# Patient Record
Sex: Male | Born: 1980 | Race: White | Hispanic: No
Health system: Midwestern US, Community
[De-identification: ages and names within clinical notes are randomized; demographics above are authoritative.]

## PROBLEM LIST (undated history)

## (undated) DIAGNOSIS — E109 Type 1 diabetes mellitus without complications: Secondary | ICD-10-CM

## (undated) DIAGNOSIS — I213 ST elevation (STEMI) myocardial infarction of unspecified site: Secondary | ICD-10-CM

## (undated) DIAGNOSIS — Z8614 Personal history of Methicillin resistant Staphylococcus aureus infection: Secondary | ICD-10-CM

## (undated) DIAGNOSIS — I200 Unstable angina: Secondary | ICD-10-CM

## (undated) DIAGNOSIS — I639 Cerebral infarction, unspecified: Secondary | ICD-10-CM

## (undated) DIAGNOSIS — N179 Acute kidney failure, unspecified: Secondary | ICD-10-CM

## (undated) DIAGNOSIS — Z9889 Other specified postprocedural states: Secondary | ICD-10-CM

## (undated) DIAGNOSIS — I2510 Atherosclerotic heart disease of native coronary artery without angina pectoris: Secondary | ICD-10-CM

## (undated) DIAGNOSIS — I10 Essential (primary) hypertension: Secondary | ICD-10-CM

## (undated) DIAGNOSIS — E119 Type 2 diabetes mellitus without complications: Secondary | ICD-10-CM

## (undated) DIAGNOSIS — K859 Acute pancreatitis without necrosis or infection, unspecified: Secondary | ICD-10-CM

## (undated) DIAGNOSIS — R1012 Left upper quadrant pain: Secondary | ICD-10-CM

## (undated) HISTORY — PX: ABDOMINAL SURGERY: SHX537

## (undated) HISTORY — PX: CHOLECYSTECTOMY: SHX55

## (undated) HISTORY — PX: APPENDECTOMY (OPEN): SHX54

## (undated) HISTORY — PX: HX APPENDECTOMY: SHX54

## (undated) HISTORY — PX: HX GALL BLADDER SURGERY/CHOLE: SHX55

## (undated) HISTORY — PX: CORONARY ARTERY ANGIOPLASTY: PR CATH30428

## (undated) HISTORY — PX: WRIST SURGERY: SHX841

## (undated) HISTORY — PX: APPENDECTOMY: SHX54

---

## 2015-12-07 ENCOUNTER — Emergency Department
Admission: EM | Admit: 2015-12-07 | Discharge: 2015-12-07 | Disposition: A | Discharge status: Home / Self Care | Payer: Medicare Other | Attending: Emergency Medicine | Admitting: Emergency Medicine

## 2015-12-07 ENCOUNTER — Emergency Department: Payer: Medicare Other

## 2015-12-07 ENCOUNTER — Encounter: Payer: Self-pay | Admitting: Emergency Medicine

## 2015-12-07 DIAGNOSIS — K859 Acute pancreatitis without necrosis or infection, unspecified: Secondary | ICD-10-CM | POA: Insufficient documentation

## 2015-12-07 DIAGNOSIS — E1165 Type 2 diabetes mellitus with hyperglycemia: Secondary | ICD-10-CM | POA: Diagnosis not present

## 2015-12-07 DIAGNOSIS — Z794 Long term (current) use of insulin: Secondary | ICD-10-CM | POA: Diagnosis not present

## 2015-12-07 DIAGNOSIS — R1011 Right upper quadrant pain: Secondary | ICD-10-CM | POA: Insufficient documentation

## 2015-12-07 DIAGNOSIS — Z9089 Acquired absence of other organs: Secondary | ICD-10-CM | POA: Diagnosis not present

## 2015-12-07 DIAGNOSIS — F17220 Nicotine dependence, chewing tobacco, uncomplicated: Secondary | ICD-10-CM | POA: Insufficient documentation

## 2015-12-07 DIAGNOSIS — R739 Hyperglycemia, unspecified: Secondary | ICD-10-CM

## 2015-12-07 HISTORY — DX: Type 2 diabetes mellitus without complications: E11.9

## 2015-12-07 HISTORY — DX: Acute pancreatitis without necrosis or infection, unspecified: K85.90

## 2015-12-07 LAB — CBC
HEMATOCRIT: 49.1 % (ref 40.0–52.0)
HEMOGLOBIN: 16.4 g/dL (ref 13.0–18.0)
MCH: 28 pg (ref 26.0–34.0)
MCHC: 33.4 g/dL (ref 32.0–36.0)
MCV: 83.8 fL (ref 80.0–100.0)
Platelets: 251 10*3/uL (ref 150–440)
RBC: 5.86 MIL/uL (ref 4.40–5.90)
RDW: 13.7 % (ref 11.5–14.5)
WBC: 8.3 10*3/uL (ref 3.8–10.6)

## 2015-12-07 LAB — GLUCOSE, CAPILLARY
GLUCOSE-CAPILLARY: 485 mg/dL — AB (ref 65–99)
GLUCOSE-CAPILLARY: 410 mg/dL — AB (ref 65–99)
Glucose-Capillary: 359 mg/dL — ABNORMAL HIGH (ref 65–99)
Glucose-Capillary: 295 mg/dL — ABNORMAL HIGH (ref 65–99)

## 2015-12-07 LAB — COMPREHENSIVE METABOLIC PANEL
ALT: 40 U/L (ref 17–63)
AST: 35 U/L (ref 15–41)
Albumin: 4 g/dL (ref 3.5–5.0)
Alkaline Phosphatase: 93 U/L (ref 38–126)
Anion gap: 9 (ref 5–15)
BILIRUBIN TOTAL: 0.4 mg/dL (ref 0.3–1.2)
BUN: 14 mg/dL (ref 6–20)
CHLORIDE: 100 mmol/L — AB (ref 101–111)
CO2: 22 mmol/L (ref 22–32)
CREATININE: 1.07 mg/dL (ref 0.61–1.24)
Calcium: 9.2 mg/dL (ref 8.9–10.3)
Glucose, Bld: 626 mg/dL (ref 65–99)
POTASSIUM: 4.2 mmol/L (ref 3.5–5.1)
Sodium: 131 mmol/L — ABNORMAL LOW (ref 135–145)
TOTAL PROTEIN: 7.5 g/dL (ref 6.5–8.1)

## 2015-12-07 LAB — URINALYSIS COMPLETE WITH MICROSCOPIC (ARMC ONLY)
BACTERIA UA: NONE SEEN
Bilirubin Urine: NEGATIVE
Glucose, UA: >500 mg/dL — AB
HGB URINE DIPSTICK: NEGATIVE
KETONES UR: NEGATIVE mg/dL
LEUKOCYTES UA: NEGATIVE
NITRITE: NEGATIVE
PROTEIN: NEGATIVE mg/dL
RBC / HPF: NONE SEEN RBC/hpf (ref 0–5)
SPECIFIC GRAVITY, URINE: 1.03 (ref 1.005–1.030)
Squamous Epithelial / LPF: NONE SEEN
pH: 5 (ref 5.0–8.0)

## 2015-12-07 LAB — LIPASE, BLOOD: LIPASE: 30 U/L (ref 11–51)

## 2015-12-07 MED ORDER — ONDANSETRON HCL 4 MG/2ML IJ SOLN
4.0000 mg | Freq: Once | INTRAMUSCULAR | Status: AC
  Administered 2015-12-07: 4 mg via INTRAVENOUS
  Filled 2015-12-07: qty 2

## 2015-12-07 MED ORDER — IOHEXOL 300 MG/ML  SOLN
100.0000 mL | Freq: Once | INTRAMUSCULAR | Status: AC | PRN
  Administered 2015-12-07: 100 mL via INTRAVENOUS

## 2015-12-07 MED ORDER — INSULIN ASPART 100 UNIT/ML ~~LOC~~ SOLN
5.0000 [IU] | Freq: Once | SUBCUTANEOUS | Status: AC
  Administered 2015-12-07: 5 [IU] via INTRAVENOUS
  Filled 2015-12-07: qty 5

## 2015-12-07 MED ORDER — MORPHINE SULFATE (PF) 4 MG/ML IV SOLN
4.0000 mg | Freq: Once | INTRAVENOUS | Status: AC
  Administered 2015-12-07: 4 mg via INTRAVENOUS
  Filled 2015-12-07: qty 1

## 2015-12-07 MED ORDER — IOHEXOL 240 MG/ML SOLN
25.0000 mL | Freq: Once | INTRAMUSCULAR | Status: AC | PRN
  Administered 2015-12-07: 25 mL via ORAL

## 2015-12-07 MED ORDER — SODIUM CHLORIDE 0.9 % IV BOLUS (SEPSIS)
1000.0000 mL | Freq: Once | INTRAVENOUS | Status: AC
  Administered 2015-12-07: 1000 mL via INTRAVENOUS

## 2015-12-07 MED ORDER — OXYCODONE-ACETAMINOPHEN 5-325 MG PO TABS: 1.0000 | ORAL_TABLET | Freq: Four times a day (QID) | ORAL | Status: AC | PRN

## 2015-12-07 MED ORDER — HYDROMORPHONE HCL 1 MG/ML IJ SOLN
1.0000 mg | Freq: Once | INTRAMUSCULAR | Status: AC
  Administered 2015-12-07: 1 mg via INTRAVENOUS
  Filled 2015-12-07: qty 1

## 2015-12-07 NOTE — ED Notes (Signed)
Pt off floor to CT

## 2015-12-07 NOTE — Discharge Instructions (Signed)
Abdominal Pain, Adult °Many things can cause abdominal pain. Usually, abdominal pain is not caused by a disease and will improve without treatment. It can often be observed and treated at home. Your health care provider will do a physical exam and possibly order blood tests and X-rays to help determine the seriousness of your pain. However, in many cases, more time must pass before a clear cause of the pain can be found. Before that point, your health care provider may not know if you need more testing or further treatment. °HOME CARE INSTRUCTIONS °Monitor your abdominal pain for any changes. The following actions may help to alleviate any discomfort you are experiencing: °· Only take over-the-counter or prescription medicines as directed by your health care provider. °· Do not take laxatives unless directed to do so by your health care provider. °· Try a clear liquid diet (broth, tea, or water) as directed by your health care provider. Slowly move to a bland diet as tolerated. °SEEK MEDICAL CARE IF: °· You have unexplained abdominal pain. °· You have abdominal pain associated with nausea or diarrhea. °· You have pain when you urinate or have a bowel movement. °· You experience abdominal pain that wakes you in the night. °· You have abdominal pain that is worsened or improved by eating food. °· You have abdominal pain that is worsened with eating fatty foods. °· You have a fever. °SEEK IMMEDIATE MEDICAL CARE IF: °· Your pain does not go away within 2 hours. °· You keep throwing up (vomiting). °· Your pain is felt only in portions of the abdomen, such as the right side or the left lower portion of the abdomen. °· You pass bloody or black tarry stools. °MAKE SURE YOU: °· Understand these instructions. °· Will watch your condition. °· Will get help right away if you are not doing well or get worse. °  °This information is not intended to replace advice given to you by your health care provider. Make sure you discuss  any questions you have with your health care provider. °  °Document Released: 06/17/2005 Document Revised: 05/29/2015 Document Reviewed: 05/17/2013 °Elsevier Interactive Patient Education ©2016 [employer] ° °Hyperglycemia °Hyperglycemia occurs when the glucose (sugar) in your blood is too high. Hyperglycemia can happen for many reasons, but it most often happens to people who do not know they have diabetes or are not managing their diabetes properly.  °CAUSES  °Whether you have diabetes or not, there are other causes of hyperglycemia. Hyperglycemia can occur when you have diabetes, but it can also occur in other situations that you might not be as aware of, such as: °Diabetes °· If you have diabetes and are having problems controlling your blood glucose, hyperglycemia could occur because of some of the following reasons: °¨ Not following your meal plan. °¨ Not taking your diabetes medications or not taking it properly. °¨ Exercising less or doing less activity than you normally do. °¨ Being sick. °Pre-diabetes °· This cannot be ignored. Before people develop Type 2 diabetes, they almost always have "pre-diabetes." This is when your blood glucose levels are higher than normal, but not yet high enough to be diagnosed as diabetes. Research has shown that some long-term damage to the body, especially the heart and circulatory system, may already be occurring during pre-diabetes. If you take action to manage your blood glucose when you have pre-diabetes, you may delay or prevent Type 2 diabetes from developing. °Stress °· If you have diabetes, you may be "diet"   controlled or on oral medications or insulin to control your diabetes. However, you may find that your blood glucose is higher than usual in the hospital whether you have diabetes or not. This is often referred to as "stress hyperglycemia." Stress can elevate your blood glucose. This happens because of hormones put out by the body during times of stress. If  stress has been the cause of your high blood glucose, it can be followed regularly by your caregiver. That way he/she can make sure your hyperglycemia does not continue to get worse or progress to diabetes. °Steroids °· Steroids are medications that act on the infection fighting system (immune system) to block inflammation or infection. One side effect can be a rise in blood glucose. Most people can produce enough extra insulin to allow for this rise, but for those who cannot, steroids make blood glucose levels go even higher. It is not unusual for steroid treatments to "uncover" diabetes that is developing. It is not always possible to determine if the hyperglycemia will go away after the steroids are stopped. A special blood test called an A1c is sometimes done to determine if your blood glucose was elevated before the steroids were started. °SYMPTOMS °· Thirsty. °· Frequent urination. °· Dry mouth. °· Blurred vision. °· Tired or fatigue. °· Weakness. °· Sleepy. °· Tingling in feet or leg. °DIAGNOSIS  °Diagnosis is made by monitoring blood glucose in one or all of the following ways: °· A1c test. This is a chemical found in your blood. °· Fingerstick blood glucose monitoring. °· Laboratory results. °TREATMENT  °First, knowing the cause of the hyperglycemia is important before the hyperglycemia can be treated. Treatment may include, but is not be limited to: °· Education. °· Change or adjustment in medications. °· Change or adjustment in meal plan. °· Treatment for an illness, infection, etc. °· More frequent blood glucose monitoring. °· Change in exercise plan. °· Decreasing or stopping steroids. °· Lifestyle changes. °HOME CARE INSTRUCTIONS  °· Test your blood glucose as directed. °· Exercise regularly. Your caregiver will give you instructions about exercise. Pre-diabetes or diabetes which comes on with stress is helped by exercising. °· Eat wholesome, balanced meals. Eat often and at regular, fixed times. Your  caregiver or nutritionist will give you a meal plan to guide your sugar intake. °· Being at an ideal weight is important. If needed, losing as little as 10 to 15 pounds may help improve blood glucose levels. °SEEK MEDICAL CARE IF:  °· You have questions about medicine, activity, or diet. °· You continue to have symptoms (problems such as increased thirst, urination, or weight gain). °SEEK IMMEDIATE MEDICAL CARE IF:  °· You are vomiting or have diarrhea. °· Your breath smells fruity. °· You are breathing faster or slower. °· You are very sleepy or incoherent. °· You have numbness, tingling, or pain in your feet or hands. °· You have chest pain. °· Your symptoms get worse even though you have been following your caregiver's orders. °· If you have any other questions or concerns. °  °This information is not intended to replace advice given to you by your health care provider. Make sure you discuss any questions you have with your health care provider. °  °Document Released: 03/03/2001 Document Revised: 11/30/2011 Document Reviewed: 05/14/2015 °Elsevier Interactive Patient Education ©2016 [employer] ° °

## 2015-12-07 NOTE — ED Notes (Signed)
Critical glucose level. MD notified.

## 2015-12-07 NOTE — ED Notes (Signed)
Patient presents to the ED with hyperglycemia and right upper quadrant abdominal pain.  Patient reports his blood sugar has been in the 500s the past two days and patient states the abdominal pain started around the same time.  Patient reports history of diabetes and pancreatitis.  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  Patient has recently moved and has not yet set up primary care in this area.

## 2015-12-07 NOTE — ED Provider Notes (Signed)
[facility] Emergency Department Provider Note  ____________________________________________  Time seen: Approximately 5:10 PM  I have reviewed the triage vital signs and the nursing notes.   HISTORY  Chief Complaint Hyperglycemia    HPI Cameron Barnett is a 35 y.o. male with a history of diabetes and pancreatitis who is presenting to the emergency department today with hyperglycemia as well as right upper quadrant or right flank pain. He says that the pain started 2 days ago and has progressed to a 7 out of 10. He says it feels like someone is stabbing him. He denies any vomiting but says he has been nauseous. Denies any diarrhea. Says he has had his gallbladder as well as his appendix taken out. Says that he is a history of pancreatitis. Denies any drinking history. Says that the last time he had pancreatitis the pain started to the right side of his abdomen and then progressed to the point where is across his whole upper abdomen. Patient says that he has been compliant with his insulin and is not eating any excess of sugar or carbohydrates.   Past Medical History  Diagnosis Date  . Diabetes mellitus without complication (HCC)   . Pancreatitis     There are no active problems to display for this patient.   Past Surgical History  Procedure Laterality Date  . Cholecystectomy    . Appendectomy    . Wrist surgery      No current outpatient prescriptions on file.  Allergies Penicillins  History reviewed. No pertinent family history.  Social History Social History  Substance Use Topics  . Smoking status: Never Smoker   . Smokeless tobacco: Current User    Types: Chew  . Alcohol Use: No    Review of Systems Constitutional: No fever/chills Eyes: No visual changes. ENT: No sore throat. Cardiovascular: Denies chest pain. Respiratory: Denies shortness of breath. Gastrointestinal:no vomiting.  No diarrhea.  No constipation. Genitourinary: Negative  for dysuria. Musculoskeletal: Negative for back pain. Skin: Negative for rash. Neurological: Negative for headaches, focal weakness or numbness.  10-point ROS otherwise negative.  ____________________________________________   PHYSICAL EXAM:  VITAL SIGNS: ED Triage Vitals  Enc Vitals Group     BP 12/07/15 1657 146/95 mmHg     Pulse Rate 12/07/15 1657 116     Resp 12/07/15 1657 20     Temp 12/07/15 1657 98 F (36.7 C)     Temp Source 12/07/15 1657 Oral     SpO2 12/07/15 1657 100 %     Weight 12/07/15 1657 223 lb (101.152 kg)     Height 12/07/15 1657 6\' 1"  (1.854 m)     Head Cir --      Peak Flow --      Pain Score 12/07/15 1658 7     Pain Loc --      Pain Edu? --      Excl. in GC? --     Constitutional: Alert and oriented. Well appearing and in no acute distress. Eyes: Conjunctivae are normal. PERRL. EOMI. Head: Atraumatic. Nose: No congestion/rhinnorhea. Mouth/Throat: Mucous membranes are moist.   Neck: No stridor.   Cardiovascular: Normal rate, regular rhythm. Grossly normal heart sounds.  Good peripheral circulation. Respiratory: Normal respiratory effort.  No retractions. Lungs CTAB. Gastrointestinal: Soft With mild right upper quadrant as well as right flank tenderness. Negative Murphy sign. No distention. No CVA tenderness. Musculoskeletal: No lower extremity tenderness nor edema.  No joint effusions. Neurologic:  Normal speech and language. No  gross focal neurologic deficits are appreciated. No gait instability. Skin:  Skin is warm, dry and intact. No rash noted. Psychiatric: Mood and affect are normal. Speech and behavior are normal.  ____________________________________________   LABS (all labs ordered are listed, but only abnormal results are displayed)  Labs Reviewed  URINALYSIS COMPLETEWITH MICROSCOPIC ([facility] ONLY) - Abnormal; Notable for the following:    Color, Urine STRAW (*)    APPearance CLEAR (*)    Glucose, UA >500 (*)    All other components  within normal limits  COMPREHENSIVE METABOLIC PANEL - Abnormal; Notable for the following:    Sodium 131 (*)    Chloride 100 (*)    Glucose, Bld 626 (*)    All other components within normal limits  GLUCOSE, CAPILLARY - Abnormal; Notable for the following:    Glucose-Capillary 485 (*)    All other components within normal limits  GLUCOSE, CAPILLARY - Abnormal; Notable for the following:    Glucose-Capillary 410 (*)    All other components within normal limits  GLUCOSE, CAPILLARY - Abnormal; Notable for the following:    Glucose-Capillary 359 (*)    All other components within normal limits  CBC  LIPASE, BLOOD  CBG MONITORING, ED   ____________________________________________  EKG  ED ECG REPORT I, Olesya Wike,  Teena Irani, the attending physician, personally viewed and interpreted this ECG.   Date: 12/07/2015  EKG Time: 1700  Rate: 111  Rhythm: sinus tachycardia  Axis: Normal  Intervals:none  ST&T Change: No ST segment elevation or depression. No abnormal T-wave inversion.  ____________________________________________  RADIOLOGY  IMPRESSION: 1. No acute abnormality. 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 3. Mild diffuse hepatic steatosis.   Electronically Signed By: Beckie Salts M.D. On: 12/07/2015 19:40 ____________________________________________   PROCEDURES   ____________________________________________   INITIAL IMPRESSION / ASSESSMENT AND PLAN / ED COURSE  Pertinent labs & imaging results that were available during my care of the patient were reviewed by me and considered in my medical decision making (see chart for details).  ----------------------------------------- 6:17 PM on 12/07/2015 -----------------------------------------  Patient with only mild relief pain with morphine. We'll repeat dose pain medication. No x-rays for pain on his blood work. We'll proceed to CAT scan. Patient denies any shortness of breath. Denies any  radiation of the chest. Says that the pain does not get worse with deep breathing.  ----------------------------------------- 9:40 PM on 12/07/2015 -----------------------------------------  Patient still having waxing and waning pain to the right upper quadrant. He still denies any pain with deep inspiration or pain radiating up into his chest. His CT was reassuring. His heart rate while I reassessed him was 85 bpm. He said he would prefer to go home at this time. I will refer him to the [facility] as he does not have any follow-up for outpatient diabetes treatment at this time. I'll be discharging the patient with Percocet. He says he has insulin at home. He knows to return if the pain worsens. Understands the plan and willing to comply. I did offer him admission but he says he would prefer to go home at this time since the workup has been essentially reassuring. ____________________________________________   FINAL CLINICAL IMPRESSION(S) / ED DIAGNOSES  Right upper quadrant abdominal pain. Hyperglycemia.    Myrna Blazer, MD 12/07/15 2142

## 2016-09-10 IMAGING — CT CT ABD-PELV W/ CM
1 of 2 series · 15 of 32 positions shown, 19 images · IV contrast (omnipaque)
Comparison: None.

CLINICAL DATA: Abdominal pain for the past 2 days. Previous
appendectomy and cholecystectomy. History of pancreatitis.

EXAM:
CT ABDOMEN AND PELVIS WITH CONTRAST
TECHNIQUE: Multidetector CT imaging of the abdomen and pelvis was performed
using the standard protocol following bolus administration of
intravenous contrast.
CONTRAST:  100mL OMNIPAQUE IOHEXOL 300 MG/ML  SOLN

[Series 2: routine abd pel with · axial · 0.82mm/px · z∈[-612,-98]mm · 15 of 113 slices shown, 19 images]
[im 5/113  soft-tissue]
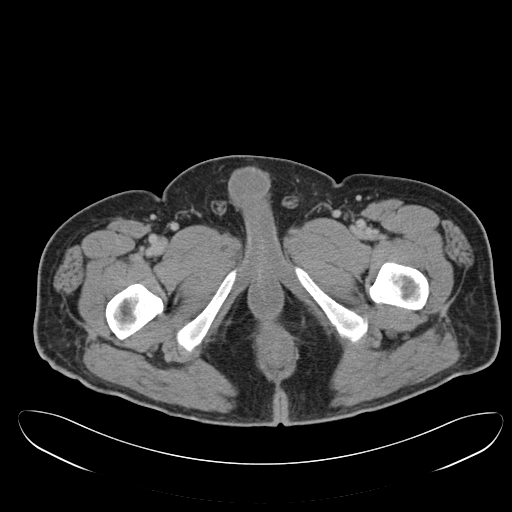
[im 5/113  bone]
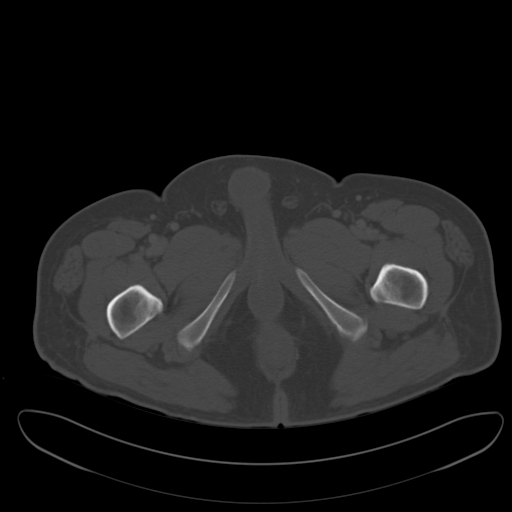
[im 13/113  soft-tissue]
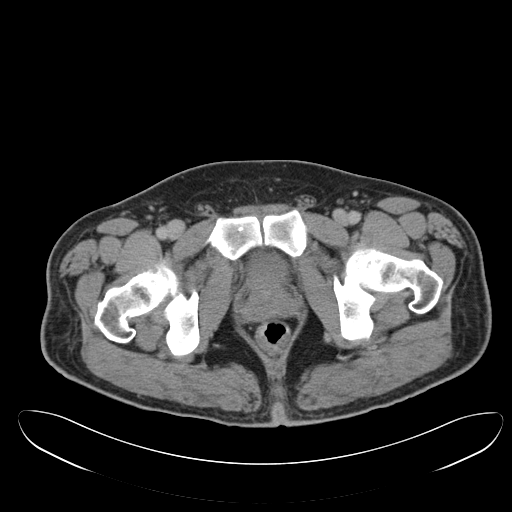
[im 22/113  soft-tissue]
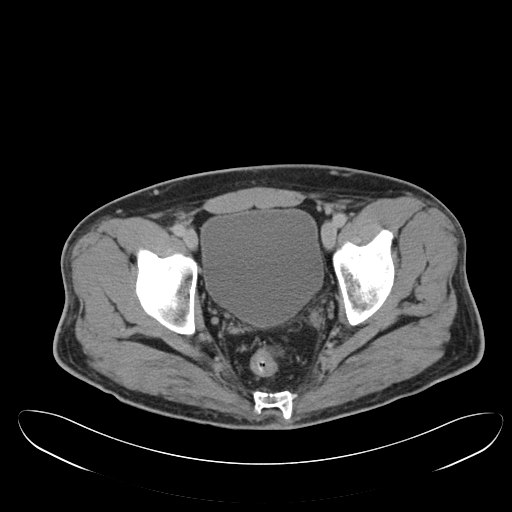
[im 31/113  soft-tissue]
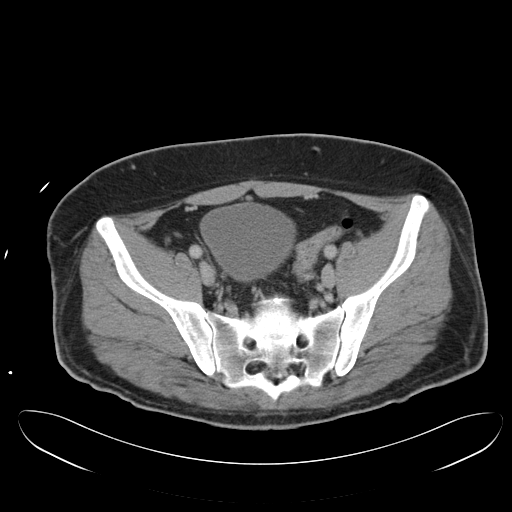
[im 39/113  soft-tissue]
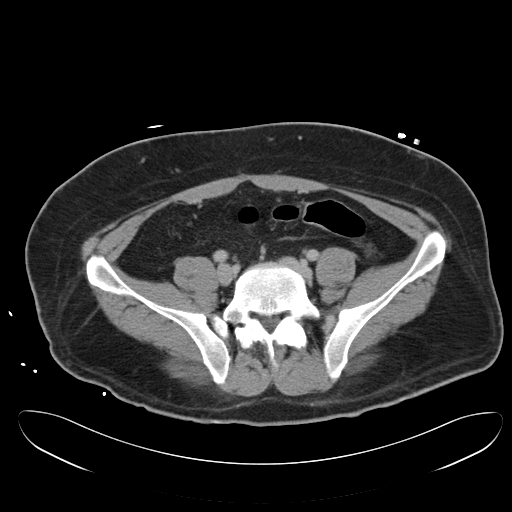
[im 48/113  soft-tissue]
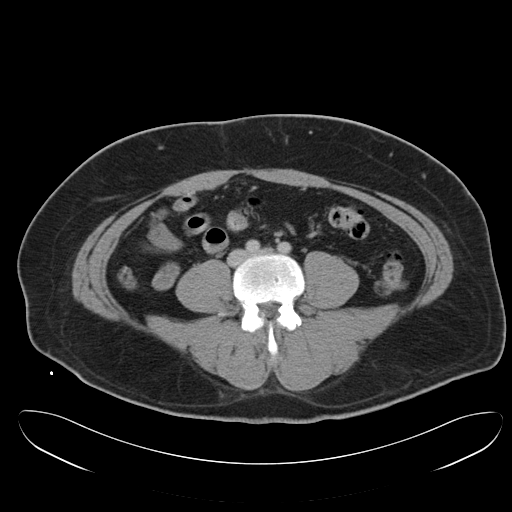
[im 57/113  soft-tissue]
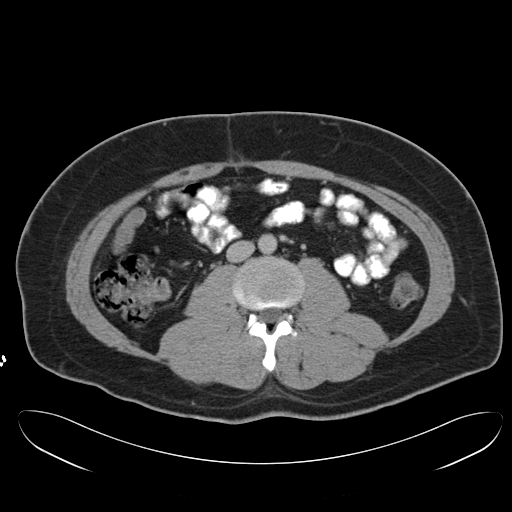
[im 65/113  soft-tissue]
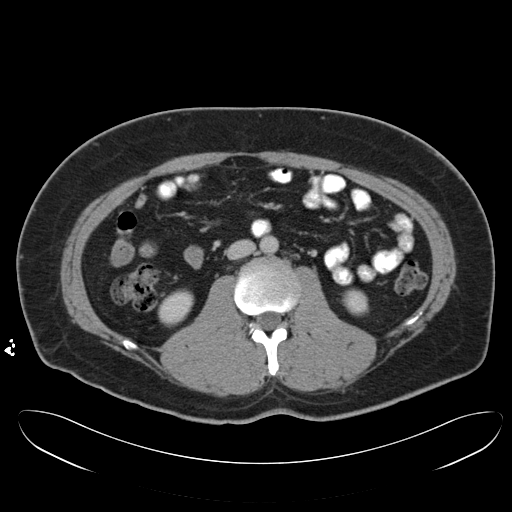
[im 74/113  soft-tissue]
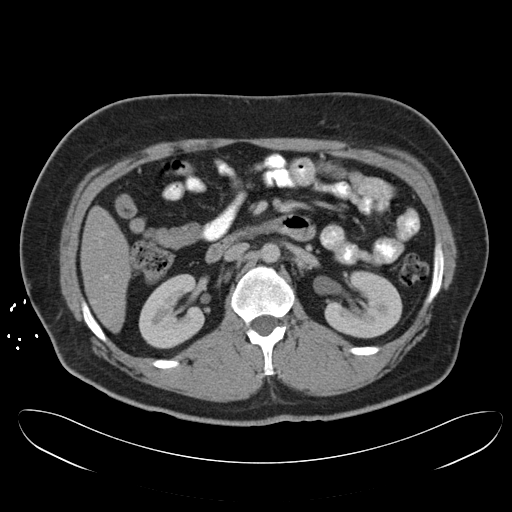
[im 74/113  bone]
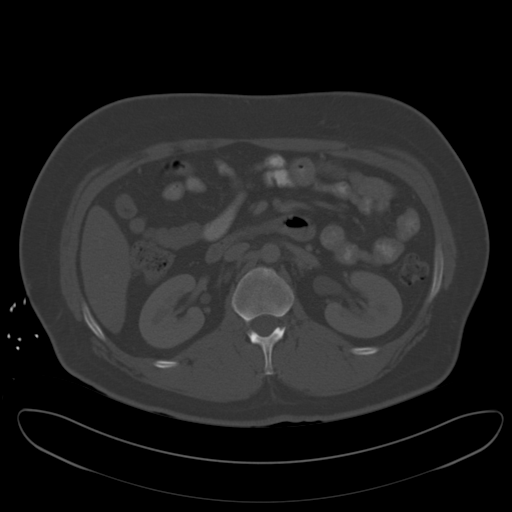
[im 82/113  soft-tissue]
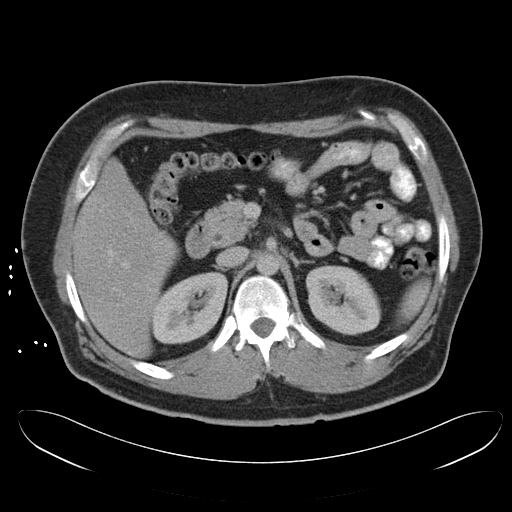
[im 91/113  soft-tissue]
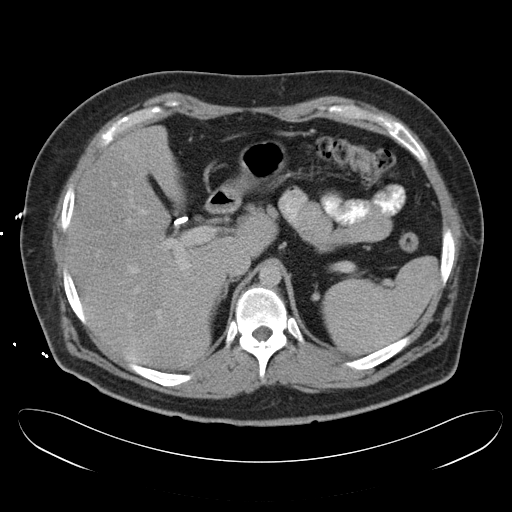
[im 95/113  lung]
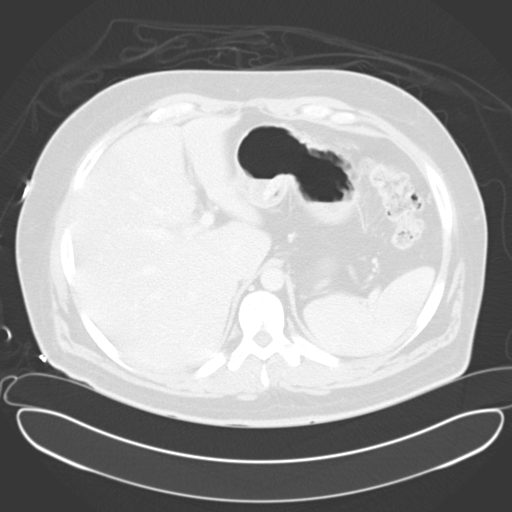
[im 100/113  soft-tissue]
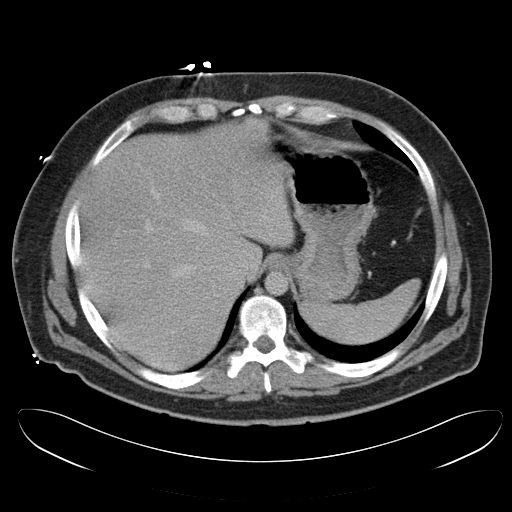
[im 100/113  lung]
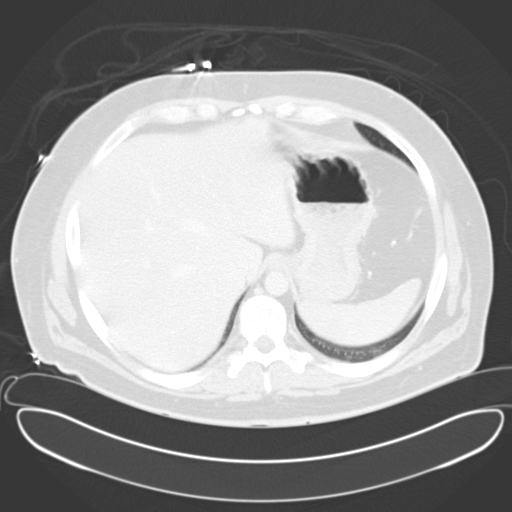
[im 104/113  lung]
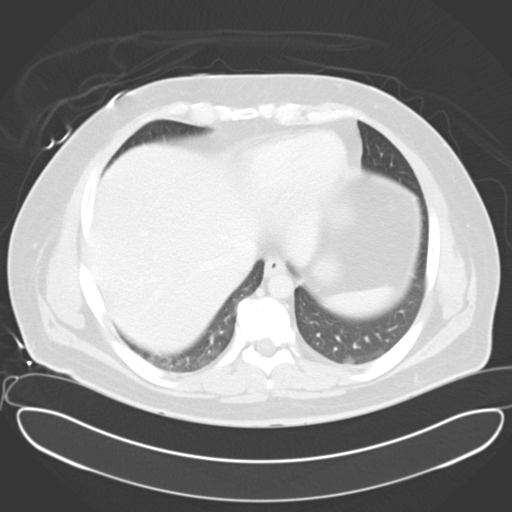
[im 108/113  soft-tissue]
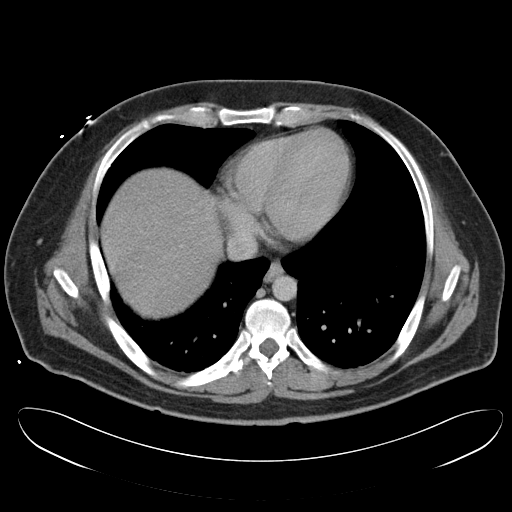
[im 108/113  lung]
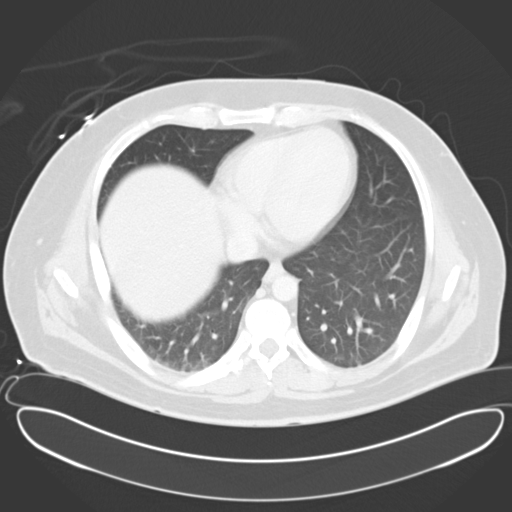

[15 of 32 positions shown; findings below may reference images not displayed]

FINDINGS: Lower chest:  Minimal bilateral dependent atelectasis.

Hepatobiliary: Mild diffuse low density of the liver relative to the
spleen. Cholecystectomy clips.

Pancreas: The tail and distal body of the pancreas are not present.
The head, neck and proximal body have normal appearances.

Spleen: Within normal limits in size and appearance.

Adrenals/Urinary Tract: No masses identified. No evidence of
hydronephrosis.

Stomach/Bowel: No gastrointestinal abnormalities. Surgically absent
appendix.

Vascular/Lymphatic: No pathologically enlarged lymph nodes. No
evidence of abdominal aortic aneurysm.

Reproductive: No mass or other significant abnormality.

Other: None.

Musculoskeletal: Minimal lumbar and lower thoracic spine
degenerative changes.
IMPRESSION: 1. No acute abnormality.
2. Surgical or congenital absence of the distal pancreas.
3. Mild diffuse hepatic steatosis.

## 2016-10-14 ENCOUNTER — Inpatient Hospital Stay
Admit: 2016-10-14 | Discharge: 2016-10-15 | Disposition: A | Discharge status: Home / Self Care | Payer: MEDICARE | Attending: Emergency Medicine

## 2016-10-14 ENCOUNTER — Emergency Department: Admit: 2016-10-14 | Payer: MEDICARE

## 2016-10-14 ENCOUNTER — Emergency Department

## 2016-10-14 DIAGNOSIS — R197 Diarrhea, unspecified: Secondary | ICD-10-CM

## 2016-10-14 LAB — URINALYSIS W/ REFLEX CULTURE
Bacteria: NEGATIVE /hpf
Bilirubin: NEGATIVE
Blood: NEGATIVE
Glucose: >1000 mg/dL — AB
Ketone: NEGATIVE mg/dL
Leukocyte Esterase: NEGATIVE
Nitrites: NEGATIVE
Protein: NEGATIVE mg/dL
Specific gravity: 1.005 (ref 1.003–1.030)
Urobilinogen: 0.2 EU/dL (ref 0.2–1.0)
pH (UA): 5 (ref 5.0–8.0)

## 2016-10-14 LAB — CBC WITH AUTOMATED DIFF
ABS. BASOPHILS: 0 10*3/uL (ref 0.0–0.1)
ABS. EOSINOPHILS: 0.2 10*3/uL (ref 0.0–0.4)
ABS. LYMPHOCYTES: 2 10*3/uL (ref 0.8–3.5)
ABS. MONOCYTES: 0.5 10*3/uL (ref 0.0–1.0)
ABS. NEUTROPHILS: 5.4 10*3/uL (ref 1.8–8.0)
BASOPHILS: 0 % (ref 0–1)
EOSINOPHILS: 3 % (ref 0–7)
HCT: 46.3 % (ref 36.6–50.3)
HGB: 16.4 g/dL (ref 12.1–17.0)
LYMPHOCYTES: 24 % (ref 12–49)
MCH: 27.8 PG (ref 26.0–34.0)
MCHC: 35.4 g/dL (ref 30.0–36.5)
MCV: 78.6 FL — ABNORMAL LOW (ref 80.0–99.0)
MONOCYTES: 6 % (ref 5–13)
NEUTROPHILS: 67 % (ref 32–75)
PLATELET: 252 10*3/uL (ref 150–400)
RBC: 5.89 M/uL — ABNORMAL HIGH (ref 4.10–5.70)
RDW: 12.6 % (ref 11.5–14.5)
WBC: 8.1 10*3/uL (ref 4.1–11.1)

## 2016-10-14 LAB — METABOLIC PANEL, COMPREHENSIVE
A-G Ratio: 0.9 — ABNORMAL LOW (ref 1.1–2.2)
ALT (SGPT): 35 U/L (ref 12–78)
AST (SGOT): 32 U/L (ref 15–37)
Albumin: 3.6 g/dL (ref 3.5–5.0)
Alk. phosphatase: 115 U/L (ref 45–117)
Anion gap: 10 mmol/L (ref 5–15)
BUN/Creatinine ratio: 9 — ABNORMAL LOW (ref 12–20)
BUN: 12 MG/DL (ref 6–20)
Bilirubin, total: 0.5 MG/DL (ref 0.2–1.0)
CO2: 22 mmol/L (ref 21–32)
Calcium: 8.3 MG/DL — ABNORMAL LOW (ref 8.5–10.1)
Chloride: 98 mmol/L (ref 97–108)
Creatinine: 1.32 MG/DL — ABNORMAL HIGH (ref 0.70–1.30)
GFR est AA: >60 mL/min/{1.73_m2} (ref >60)
GFR est non-AA: >60 mL/min/{1.73_m2} (ref >60)
Globulin: 4.1 g/dL — ABNORMAL HIGH (ref 2.0–4.0)
Glucose: 510 mg/dL — ABNORMAL HIGH (ref 65–100)
Potassium: 4.2 mmol/L (ref 3.5–5.1)
Protein, total: 7.7 g/dL (ref 6.4–8.2)
Sodium: 130 mmol/L — ABNORMAL LOW (ref 136–145)

## 2016-10-14 LAB — GLUCOSE, POC: Glucose (POC): 461 mg/dL — ABNORMAL HIGH (ref 65–100)

## 2016-10-14 LAB — LIPASE: Lipase: 168 U/L (ref 73–393)

## 2016-10-14 MED ORDER — ONDANSETRON (PF) 4 MG/2 ML INJECTION
4 mg/2 mL | INTRAMUSCULAR | Status: AC
Start: 2016-10-14 — End: 2016-10-14
  Administered 2016-10-14: 23:00:00 via INTRAVENOUS

## 2016-10-14 MED ORDER — MORPHINE 4 MG/ML SYRINGE
4 mg/mL | INTRAMUSCULAR | Status: AC
Start: 2016-10-14 — End: 2016-10-14
  Administered 2016-10-14: 23:00:00 via INTRAVENOUS

## 2016-10-14 MED ORDER — IOPAMIDOL 76 % IV SOLN
370 mg iodine /mL (76 %) | Freq: Once | INTRAVENOUS | Status: AC
Start: 2016-10-14 — End: 2016-10-14
  Administered 2016-10-14: via INTRAVENOUS

## 2016-10-14 MED ORDER — SODIUM CHLORIDE 0.9 % IJ SYRG
Freq: Once | INTRAMUSCULAR | Status: AC
Start: 2016-10-14 — End: 2016-10-14
  Administered 2016-10-14: via INTRAVENOUS

## 2016-10-14 MED ORDER — INSULIN REGULAR HUMAN 100 UNIT/ML INJECTION
100 unit/mL | INTRAMUSCULAR | Status: AC
Start: 2016-10-14 — End: 2016-10-14
  Administered 2016-10-14: 23:00:00 via INTRAVENOUS

## 2016-10-14 MED ORDER — SODIUM CHLORIDE 0.9% BOLUS IV
0.9 % | Freq: Once | INTRAVENOUS | Status: AC
Start: 2016-10-14 — End: 2016-10-14
  Administered 2016-10-14: 22:00:00 via INTRAVENOUS

## 2016-10-14 MED ORDER — SODIUM CHLORIDE 0.9 % IV
Freq: Once | INTRAVENOUS | Status: AC
Start: 2016-10-14 — End: 2016-10-14
  Administered 2016-10-14: via INTRAVENOUS

## 2016-10-14 MED FILL — SODIUM CHLORIDE 0.9 % IV: INTRAVENOUS | Qty: 1000

## 2016-10-14 MED FILL — ISOVUE-370  76 % INTRAVENOUS SOLUTION: 370 mg iodine /mL (76 %) | INTRAVENOUS | Qty: 100

## 2016-10-14 MED FILL — BD POSIFLUSH NORMAL SALINE 0.9 % INJECTION SYRINGE: INTRAMUSCULAR | Qty: 10

## 2016-10-14 MED FILL — ONDANSETRON (PF) 4 MG/2 ML INJECTION: 4 mg/2 mL | INTRAMUSCULAR | Qty: 2

## 2016-10-14 MED FILL — INSULIN REGULAR HUMAN 100 UNIT/ML INJECTION: 100 unit/mL | INTRAMUSCULAR | Qty: 1

## 2016-10-14 MED FILL — MORPHINE 4 MG/ML SYRINGE: 4 mg/mL | INTRAMUSCULAR | Qty: 1

## 2016-10-14 NOTE — ED Provider Notes (Signed)
EMERGENCY DEPARTMENT HISTORY AND PHYSICAL EXAM      Date: 10/14/2016  Patient Name: Evan Crawford    History of Presenting Illness     No chief complaint on file.      History Provided By: Patient and EMS    HPI: Karen Kays, 36 y.o. male with PMHx significant for DM, cholecystectomy and appendectomy, presents via EMS to the ED with cc of constant stabbing LUQ pain with associated nausea and five episodes of watery diarrhea with bright red blood onset 0300. 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 EMS reports hyperglycemia 534 upon arrival; pt states DM is controlled with insulin. Pt denies taking any medications for his sx's pta. Pt denies any chronic NSAID use or recent abx use. He notes he was admitted to a hospital ~3 weeks ago for 3 days for acute pancreatitis. Pt denies hx of kidney stones. He specifically denies any fever, chills, vomiting, hematemesis, and melena.     PCP: Phys Other, MD    There are no other complaints, changes, or physical findings at this time.    Current Outpatient Prescriptions   Medication Sig Dispense Refill   ??? HYDROcodone-acetaminophen (NORCO) 5-325 mg per tablet Take 1 Tab by mouth every four (4) hours as needed for Pain. Max Daily Amount: 6 Tabs. 15 Tab 0   ??? ondansetron (ZOFRAN ODT) 4 mg disintegrating tablet Take 1 Tab by mouth every eight (8) hours as needed for Nausea. 15 Tab 0       Past History     Past Medical History:  No past medical history on file.    Past Surgical History:  No past surgical history on file.    Family History:  No family history on file.    Social History:  Social History   Substance Use Topics   ??? Smoking status: Not on file   ??? Smokeless tobacco: Not on file   ??? Alcohol use Not on file       Allergies:  Allergies   Allergen Reactions   ??? Penicillins Other (comments)     Pt unsure, believes it may cause rash.          Review of Systems   Review of Systems   Constitutional: Negative for activity change, chills and fever.    HENT: Negative for congestion and sore throat.    Eyes: Negative for pain and redness.   Respiratory: Negative for cough, chest tightness and shortness of breath.    Cardiovascular: Negative for chest pain and palpitations.   Gastrointestinal: Positive for abdominal pain (LUQ), blood in stool, diarrhea and nausea. Negative for vomiting.        - hematemesis   - melena   Genitourinary: Negative for dysuria, frequency and urgency.   Musculoskeletal: Negative for back pain and neck pain.   Skin: Negative for rash.   Neurological: Negative for syncope, light-headedness and headaches.   Psychiatric/Behavioral: Negative for confusion.   All other systems reviewed and are negative.      Physical Exam   Physical Exam   Constitutional: He is oriented to person, place, and time. He appears well-developed and well-nourished. No distress.   HENT:   Head: Normocephalic.   Nose: Nose normal.   Mouth/Throat: Oropharynx is clear and moist. No oropharyngeal exudate.   Eyes: Conjunctivae are normal. Pupils are equal, round, and reactive to light. No scleral icterus.   Neck: Normal range of motion. Neck supple. No JVD present.  No tracheal deviation present. No thyromegaly present.   Cardiovascular: Regular rhythm and intact distal pulses.  Tachycardia present.  Exam reveals no gallop and no friction rub.    No murmur heard.  Pulmonary/Chest: Effort normal and breath sounds normal. No stridor. No respiratory distress. He has no wheezes. He has no rales.   Abdominal: Soft. Bowel sounds are normal. He exhibits no distension. There is no rebound and no guarding.   Moderate epigastric and LUQ tenderness   Genitourinary:   Genitourinary Comments: External exam WNL   Musculoskeletal: Normal range of motion. He exhibits no edema.   Lymphadenopathy:     He has no cervical adenopathy.   Neurological: He is alert and oriented to person, place, and time. No cranial nerve deficit. He exhibits normal muscle tone. Coordination normal.    Skin: Skin is warm and dry. No rash noted. He is not diaphoretic. No erythema.   Psychiatric: He has a normal mood and affect. His behavior is normal.   Nursing note and vitals reviewed.        Diagnostic Study Results     Labs -     Recent Results (from the past 12 hour(s))   CBC WITH AUTOMATED DIFF    Collection Time: 10/14/16  5:07 PM   Result Value Ref Range    WBC 8.1 4.1 - 11.1 K/uL    RBC 5.89 (H) 4.10 - 5.70 M/uL    HGB 16.4 12.1 - 17.0 g/dL    HCT 46.3 36.6 - 50.3 %    MCV 78.6 (L) 80.0 - 99.0 FL    MCH 27.8 26.0 - 34.0 PG    MCHC 35.4 30.0 - 36.5 g/dL    RDW 12.6 11.5 - 14.5 %    PLATELET 252 150 - 400 K/uL    NEUTROPHILS 67 32 - 75 %    LYMPHOCYTES 24 12 - 49 %    MONOCYTES 6 5 - 13 %    EOSINOPHILS 3 0 - 7 %    BASOPHILS 0 0 - 1 %    ABS. NEUTROPHILS 5.4 1.8 - 8.0 K/UL    ABS. LYMPHOCYTES 2.0 0.8 - 3.5 K/UL    ABS. MONOCYTES 0.5 0.0 - 1.0 K/UL    ABS. EOSINOPHILS 0.2 0.0 - 0.4 K/UL    ABS. BASOPHILS 0.0 0.0 - 0.1 K/UL   METABOLIC PANEL, COMPREHENSIVE    Collection Time: 10/14/16  5:07 PM   Result Value Ref Range    Sodium 130 (L) 136 - 145 mmol/L    Potassium 4.2 3.5 - 5.1 mmol/L    Chloride 98 97 - 108 mmol/L    CO2 22 21 - 32 mmol/L    Anion gap 10 5 - 15 mmol/L    Glucose 510 (H) 65 - 100 mg/dL    BUN 12 6 - 20 MG/DL    Creatinine 1.32 (H) 0.70 - 1.30 MG/DL    BUN/Creatinine ratio 9 (L) 12 - 20      GFR est AA >60 >60 ml/min/1.25m    GFR est non-AA >60 >60 ml/min/1.737m   Calcium 8.3 (L) 8.5 - 10.1 MG/DL    Bilirubin, total 0.5 0.2 - 1.0 MG/DL    ALT (SGPT) 35 12 - 78 U/L    AST (SGOT) 32 15 - 37 U/L    Alk. phosphatase 115 45 - 117 U/L    Protein, total 7.7 6.4 - 8.2 g/dL    Albumin 3.6 3.5 - 5.0 g/dL  Globulin 4.1 (H) 2.0 - 4.0 g/dL    A-G Ratio 0.9 (L) 1.1 - 2.2     LIPASE    Collection Time: 10/14/16  5:07 PM   Result Value Ref Range    Lipase 168 73 - 393 U/L   URINALYSIS W/ REFLEX CULTURE    Collection Time: 10/14/16  5:07 PM   Result Value Ref Range    Color YELLOW/STRAW       Appearance CLEAR CLEAR      Specific gravity 1.005 1.003 - 1.030      pH (UA) 5.0 5.0 - 8.0      Protein NEGATIVE  NEG mg/dL    Glucose >1000 (A) NEG mg/dL    Ketone NEGATIVE  NEG mg/dL    Bilirubin NEGATIVE  NEG      Blood NEGATIVE  NEG      Urobilinogen 0.2 0.2 - 1.0 EU/dL    Nitrites NEGATIVE  NEG      Leukocyte Esterase NEGATIVE  NEG      UA:UC IF INDICATED CULTURE NOT INDICATED BY UA RESULT CNI      WBC 0-4 0 - 4 /hpf    RBC 0-5 0 - 5 /hpf    Epithelial cells FEW FEW /lpf    Bacteria NEGATIVE  NEG /hpf    Hyaline cast 0-2 0 - 5 /lpf   OCCULT BLOOD, STOOL    Collection Time: 10/14/16  5:07 PM   Result Value Ref Range    Occult blood, stool NEGATIVE  NEG     EKG, 12 LEAD, INITIAL    Collection Time: 10/14/16  5:10 PM   Result Value Ref Range    Ventricular Rate 111 BPM    Atrial Rate 111 BPM    P-R Interval 156 ms    QRS Duration 64 ms    Q-T Interval 324 ms    QTC Calculation (Bezet) 440 ms    Calculated P Axis 33 degrees    Calculated R Axis 36 degrees    Calculated T Axis 45 degrees    Diagnosis       Sinus tachycardia  Septal infarct , age undetermined  Abnormal ECG  No previous ECGs available     GLUCOSE, POC    Collection Time: 10/14/16  5:25 PM   Result Value Ref Range    Glucose (POC) 461 (H) 65 - 100 mg/dL    Performed by Edison Pace [place]    LACTIC ACID    Collection Time: 10/14/16  6:16 PM   Result Value Ref Range    Lactic acid 1.0 0.4 - 2.0 MMOL/L   GLUCOSE, POC    Collection Time: 10/14/16  8:14 PM   Result Value Ref Range    Glucose (POC) 307 (H) 65 - 100 mg/dL    Performed by Deedra Ehrich (PCT)        Radiologic Studies -     CT Results  (Last 48 hours)               10/14/16 1851  CT ABD PELV W CONT Final result    Impression:  IMPRESSION:   1. No acute abdominal or pelvic abnormality.   2. Atrophy of the pancreatic tail is suspected. No acute pancreatitis.   3. Hepatic steatosis.   4. Diverticulosis without diverticulitis.   5. Incidental and postoperative changes as described.            Narrative:  EXAM:  CT ABD PELV W CONT  INDICATION: Abdominal pain  , left upper quadrant stabbing. History of chronic   pancreatitis.       COMPARISON: None.       CONTRAST:  100 mL of Isovue-370.       TECHNIQUE:    Following the uneventful intravenous administration of contrast, thin axial   images were obtained through the abdomen and pelvis. Coronal and sagittal   reconstructions were generated. Oral contrast was not administered. CT dose   reduction was achieved through use of a standardized protocol tailored for this   examination and automatic exposure control for dose modulation.       FINDINGS:    LUNG BASES: Clear.   INCIDENTALLY IMAGED HEART AND MEDIASTINUM: Unremarkable.   LIVER: No mass or biliary dilatation. Liver parenchyma is hypodense.   GALLBLADDER: Surgically absent.   SPLEEN: No mass.   PANCREAS: The pancreatic head and uncinate process are normal. A small amount of   the pancreatic body is visualized, but the remainder of the body and tail are   not identified and may be atrophic.   ADRENALS: Unremarkable.   KIDNEYS: No mass, calculus, or hydronephrosis.   STOMACH: Unremarkable.   SMALL BOWEL: No dilatation or wall thickening.   COLON: No dilatation or wall thickening. Scattered diverticula without   associated acute inflammatory change at this time.   APPENDIX: Not seen.   PERITONEUM: No ascites or pneumoperitoneum.   RETROPERITONEUM: No lymphadenopathy or aortic aneurysm.   REPRODUCTIVE ORGANS: Unremarkable for age.   URINARY BLADDER: No mass or calculus.   BONES: No destructive bone lesion. Hemangioma suggested in a thoracic vertebral   body.   ADDITIONAL COMMENTS: N/A                 Medical Decision Making   I am the first provider for this patient.    I reviewed the vital signs, available nursing notes, past medical history, past surgical history, family history and social history.    Vital Signs-Reviewed the patient's vital signs.  Patient Vitals for the past 12 hrs:    Temp Pulse Resp BP SpO2   10/14/16 2130 - (!) 103 20 139/74 100 %   10/14/16 2030 - (!) 103 23 125/76 99 %   10/14/16 2000 - 97 22 121/73 100 %   10/14/16 1930 - 90 21 127/73 99 %   10/14/16 1830 - (!) 110 21 (!) 138/98 100 %   10/14/16 1800 - (!) 104 25 (!) 144/112 99 %   10/14/16 1730 - (!) 106 21 (!) 147/91 99 %   10/14/16 1715 - (!) 107 (!) 31 (!) 172/95 98 %   10/14/16 1706 98.4 ??F (36.9 ??C) (!) 106 17 (!) 169/96 99 %       Pulse Oximetry Analysis - 99% on RA    Cardiac Monitor:   Rate: 106 bpm  Rhythm: Sinus Tachycardia      ED EKG interpretation: 17:10  Rhythm: sinus tachycardia; and regular . Rate (approx.): 111; Axis: normal; PR Interval: normal; QRS interval: normal ; ST/T wave: non-specific changes; This EKG was interpreted by Allen Derry MD,ED Provider.    Records Reviewed: Nursing Notes, Old Medical Records and Ambulance Run Sheet    Provider Notes (Medical Decision Making):     DDx: pancreatitis, DKA, GI bleed    ED Course:   Initial assessment performed. The patients presenting problems have been discussed, and they are in agreement with the care plan formulated and outlined with them.  I  have encouraged them to ask questions as they arise throughout their visit.    Procedure Note - Rectal Exam:   7:47 PM  Performed by: Allen Derry, MD  Rectal exam performed.  Light brown loose stool was collected.  Stool was collected and sent to the lab for Hemoccult testing.   The procedure took 1-15 minutes, and pt tolerated well.    Disposition:    DISCHARGE NOTE:  10:00 PM  The patient is ready for discharge. The patient???s signs, symptoms, diagnosis, and instructions for discharge have been discussed and the pt has conveyed their understanding. The patient is to follow up as recommended with PCP or return to the ER should their symptoms worsen. Plan has been discussed and patient has conveyed their agreement.    Pt well appearing; sx much improved after iv fluids and blood sugar  control; no dka; ct abd pelvis negative for acute process; normal lipase; normal lactate; tachycardia improved with iv fluids; stool negative for blood; dc home with zofran; Allen Derry, MD      PLAN:  1. Discharge home   Discharge Medication List as of 10/14/2016  9:17 PM      START taking these medications    Details   HYDROcodone-acetaminophen (NORCO) 5-325 mg per tablet Take 1 Tab by mouth every four (4) hours as needed for Pain. Max Daily Amount: 6 Tabs., Print, Disp-15 Tab, R-0      ondansetron (ZOFRAN ODT) 4 mg disintegrating tablet Take 1 Tab by mouth every eight (8) hours as needed for Nausea., Print, Disp-15 Tab, R-0           2.   Follow-up Information     Follow up With Details Comments Contact Info    [facility] Go in 1 day If symptoms worsen [place] [id number]  [phone]        Return to ED if worse     Diagnosis     Clinical Impression:   1. Abdominal pain, LUQ (left upper quadrant)    2. Type 1 diabetes mellitus with hyperglycemia (HCC)    3. Diarrhea, unspecified type        Attestations:    This note is prepared by Dickey Gave, acting as Scribe for Allen Derry, MD.    Allen Derry, MD: The scribe's documentation has been prepared under my direction and personally reviewed by me in its entirety. I confirm that the note above accurately reflects all work, treatment, procedures, and medical decision making performed by me.

## 2016-10-14 NOTE — ED Notes (Addendum)
Patient presents to ED via EMS. Patient has had LUQ stabbing pain since about 0300. Patient stated that he also had N/V. Patient stated that he also had several episodes of diarrhea where with bright red stool. Patient arrived with IV placed by EMS. 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 Patient alert and responding appropriately. MD at bedside. Will continue to monitor and assess patient needs.

## 2016-10-14 NOTE — ED Notes (Signed)
Patient medicated for c/o pain and nausea per MD orders. Will continue to monitor and assess patient needs.

## 2016-10-14 NOTE — ED Notes (Signed)
Patient medicated per MD orders, resting in bed. Will continue to monitor and assess patient needs.

## 2016-10-14 NOTE — ED Notes (Signed)
Dc instructions per MD  All questions and concerns addressed.  Ambulated out of ER without any difficulty to ride a cab to destination.

## 2016-10-15 LAB — LACTIC ACID: Lactic acid: 1 MMOL/L (ref 0.4–2.0)

## 2016-10-15 LAB — OCCULT BLOOD, STOOL: Occult blood, stool: NEGATIVE

## 2016-10-15 LAB — EKG, 12 LEAD, INITIAL
Atrial Rate: 111 {beats}/min
Calculated P Axis: 33 degrees
Calculated R Axis: 36 degrees
Calculated T Axis: 45 degrees
P-R Interval: 156 ms
Q-T Interval: 324 ms
QRS Duration: 64 ms
QTC Calculation (Bezet): 440 ms
Ventricular Rate: 111 {beats}/min

## 2016-10-15 LAB — GLUCOSE, POC: Glucose (POC): 307 mg/dL — ABNORMAL HIGH (ref 65–100)

## 2016-10-15 LAB — EKG 12-LEAD
Atrial Rate: 111 {beats}/min
P Axis: 33 degrees
P-R Interval: 156 ms
Q-T Interval: 324 ms
QRS Duration: 64 ms
QTc Calculation (Bazett): 440 ms
R Axis: 36 degrees
T Axis: 45 degrees
Ventricular Rate: 111 {beats}/min

## 2016-10-15 MED ORDER — HYDROCODONE-ACETAMINOPHEN 5 MG-325 MG TAB
5-325 mg | ORAL_TABLET | ORAL | 0 refills | Status: AC | PRN
Start: 2016-10-15 — End: ?

## 2016-10-15 MED ORDER — MORPHINE 4 MG/ML SYRINGE
4 mg/mL | INTRAMUSCULAR | Status: AC
Start: 2016-10-15 — End: 2016-10-14
  Administered 2016-10-15: 02:00:00 via INTRAVENOUS

## 2016-10-15 MED ORDER — INSULIN REGULAR HUMAN 100 UNIT/ML INJECTION
100 unit/mL | INTRAMUSCULAR | Status: AC
Start: 2016-10-15 — End: 2016-10-14
  Administered 2016-10-15: 02:00:00 via INTRAVENOUS

## 2016-10-15 MED ORDER — ONDANSETRON (PF) 4 MG/2 ML INJECTION
4 mg/2 mL | INTRAMUSCULAR | Status: AC
Start: 2016-10-15 — End: 2016-10-14
  Administered 2016-10-15: 02:00:00 via INTRAVENOUS

## 2016-10-15 MED ORDER — ONDANSETRON 4 MG TAB, RAPID DISSOLVE
4 mg | ORAL_TABLET | Freq: Three times a day (TID) | ORAL | 0 refills | Status: AC | PRN
Start: 2016-10-15 — End: ?

## 2016-10-15 MED FILL — INSULIN REGULAR HUMAN 100 UNIT/ML INJECTION: 100 unit/mL | INTRAMUSCULAR | Qty: 1

## 2016-10-15 MED FILL — MORPHINE 4 MG/ML SYRINGE: 4 mg/mL | INTRAMUSCULAR | Qty: 1

## 2016-10-15 MED FILL — ONDANSETRON (PF) 4 MG/2 ML INJECTION: 4 mg/2 mL | INTRAMUSCULAR | Qty: 2

## 2019-03-16 HISTORY — PX: CARDIAC CATHETERIZATION: SHX172

## 2019-04-12 HISTORY — PX: CARDIAC CATHETERIZATION: SHX172

## 2020-02-14 ENCOUNTER — Inpatient Hospital Stay
Admission: EM | Admit: 2020-02-14 | Discharge: 2020-02-16 | Discharge status: Against Medical Advice | Payer: Medicare Other | Source: Other Acute Inpatient Hospital | Admitting: Internal Medicine

## 2020-02-14 ENCOUNTER — Emergency Department: Admit: 2020-02-14 | Payer: MEDICARE

## 2020-02-14 DIAGNOSIS — J189 Pneumonia, unspecified organism: Principal | ICD-10-CM

## 2020-02-14 LAB — COMPREHENSIVE METABOLIC PANEL
ALT: 43 U/L — ABNORMAL HIGH (ref 0–41)
AST: 27 U/L (ref 0–40)
Albumin: 4 g/dL (ref 3.5–4.6)
Alkaline Phosphatase: 178 U/L — ABNORMAL HIGH (ref 35–104)
Anion Gap: 15 mEq/L (ref 9–15)
BUN: 19 mg/dL (ref 6–20)
CO2: 22 mEq/L (ref 20–31)
Calcium: 9.7 mg/dL (ref 8.5–9.9)
Chloride: 95 mEq/L (ref 95–107)
Creatinine: 0.81 mg/dL (ref 0.70–1.20)
GFR African American: >60 (ref >60)
GFR Non-African American: >60 (ref >60)
Globulin: 3.3 g/dL (ref 2.3–3.5)
Glucose: 559 mg/dL (ref 70–99)
Potassium: 4.2 mEq/L (ref 3.4–4.9)
Sodium: 132 mEq/L — ABNORMAL LOW (ref 135–144)
Total Bilirubin: 0.3 mg/dL (ref 0.2–0.7)
Total Protein: 7.3 g/dL (ref 6.3–8.0)

## 2020-02-14 LAB — CBC WITH AUTO DIFFERENTIAL
Basophils %: 0.5 %
Basophils Absolute: 0 10*3/uL (ref 0.0–0.2)
Eosinophils %: 1.6 %
Eosinophils Absolute: 0.1 10*3/uL (ref 0.0–0.7)
Hematocrit: 43.8 % (ref 42.0–52.0)
Hemoglobin: 15.1 g/dL (ref 14.0–18.0)
Lymphocytes %: 19.7 %
Lymphocytes Absolute: 1.8 10*3/uL (ref 1.0–4.8)
MCH: 27.6 pg (ref 27.0–31.3)
MCHC: 34.4 % (ref 33.0–37.0)
MCV: 80.3 fL (ref 80.0–100.0)
Monocytes %: 9.5 %
Monocytes Absolute: 0.9 10*3/uL — ABNORMAL HIGH (ref 0.2–0.8)
Neutrophils %: 68.7 %
Neutrophils Absolute: 6.2 10*3/uL (ref 1.4–6.5)
Platelets: 351 10*3/uL (ref 130–400)
RBC: 5.46 M/uL (ref 4.70–6.10)
RDW: 13.2 % (ref 11.5–14.5)
WBC: 9 10*3/uL (ref 4.8–10.8)

## 2020-02-14 LAB — POCT GLUCOSE
POC Glucose: 532 mg/dl (ref 60–115)
POC Glucose: 360 mg/dl — ABNORMAL HIGH (ref 60–115)

## 2020-02-14 LAB — TROPONIN: Troponin: <0.01 ng/mL (ref 0.000–0.010)

## 2020-02-14 LAB — POCT VENOUS
Base Excess, Ven: -1 (ref <=3)
HCO3, Venous: 22.2 mmol/L — ABNORMAL LOW (ref 23.0–29.0)
Lactate: 2.8 mmol/L — ABNORMAL HIGH (ref 0.40–2.00)
O2 Sat, Ven: 77 %
TC02 (Calc), Ven: 23 mmol/L
pCO2, Ven: 29.6 mm Hg — ABNORMAL LOW (ref 40.0–50.0)
pH, Ven: 7.484 — ABNORMAL HIGH (ref 7.350–7.450)
pO2, Ven: 38 mm Hg

## 2020-02-14 LAB — MAGNESIUM: Magnesium: 1.8 mg/dL (ref 1.7–2.4)

## 2020-02-14 MED ORDER — NICOTINE 14 MG/24HR TD PT24
14 | Freq: Every day | TRANSDERMAL | Status: DC
Start: 2020-02-14 — End: 2020-02-16

## 2020-02-14 MED ORDER — SODIUM BICARBONATE 8.4 % IV SOLN
8.4 % | Freq: Once | INTRAVENOUS | Status: DC
Start: 2020-02-14 — End: 2020-02-14

## 2020-02-14 MED ORDER — NORMAL SALINE FLUSH 0.9 % IV SOLN
0.9 | Freq: Two times a day (BID) | INTRAVENOUS | Status: DC
Start: 2020-02-14 — End: 2020-02-16
  Administered 2020-02-15 – 2020-02-16 (×3): 10 mL via INTRAVENOUS

## 2020-02-14 MED ORDER — ONDANSETRON HCL 4 MG/2ML IJ SOLN
4 MG/2ML | Freq: Four times a day (QID) | INTRAMUSCULAR | Status: DC | PRN
Start: 2020-02-14 — End: 2020-02-16

## 2020-02-14 MED ORDER — GLUCAGON HCL RDNA (DIAGNOSTIC) 1 MG IJ SOLR
1 MG | INTRAMUSCULAR | Status: DC | PRN
Start: 2020-02-14 — End: 2020-02-16

## 2020-02-14 MED ORDER — ENOXAPARIN SODIUM 40 MG/0.4ML SC SOLN
40 | Freq: Every day | SUBCUTANEOUS | Status: DC
Start: 2020-02-14 — End: 2020-02-16
  Administered 2020-02-15 (×2): 40 mg via SUBCUTANEOUS

## 2020-02-14 MED ORDER — ACETAMINOPHEN 650 MG RE SUPP
650 MG | Freq: Four times a day (QID) | RECTAL | Status: DC | PRN
Start: 2020-02-14 — End: 2020-02-16

## 2020-02-14 MED ORDER — ISOSORBIDE MONONITRATE ER 60 MG PO TB24
60 MG | Freq: Every day | ORAL | Status: DC
Start: 2020-02-14 — End: 2020-02-16
  Administered 2020-02-15 (×2): 60 mg via ORAL

## 2020-02-14 MED ORDER — METOPROLOL SUCCINATE ER 100 MG PO TB24
100 MG | Freq: Every day | ORAL | Status: DC
Start: 2020-02-14 — End: 2020-02-16
  Administered 2020-02-15 (×2): 100 mg via ORAL

## 2020-02-14 MED ORDER — DEXTROSE 50 % IV SOLN
50 % | INTRAVENOUS | Status: DC | PRN
Start: 2020-02-14 — End: 2020-02-16

## 2020-02-14 MED ORDER — TICAGRELOR 90 MG PO TABS
90 MG | Freq: Two times a day (BID) | ORAL | Status: DC
Start: 2020-02-14 — End: 2020-02-16
  Administered 2020-02-15 – 2020-02-16 (×3): 90 mg via ORAL

## 2020-02-14 MED ORDER — NITROGLYCERIN 0.4 MG SL SUBL
0.4 | SUBLINGUAL | Status: DC | PRN
Start: 2020-02-14 — End: 2020-02-16

## 2020-02-14 MED ORDER — DEXTROSE 5 % IV SOLN
5 % | INTRAVENOUS | Status: DC | PRN
Start: 2020-02-14 — End: 2020-02-16

## 2020-02-14 MED ORDER — ACETAMINOPHEN 325 MG PO TABS
325 MG | Freq: Four times a day (QID) | ORAL | Status: DC | PRN
Start: 2020-02-14 — End: 2020-02-16
  Administered 2020-02-15 (×2): 650 mg via ORAL

## 2020-02-14 MED ORDER — INSULIN LISPRO 100 UNIT/ML SC SOLN
100 UNIT/ML | Freq: Every evening | SUBCUTANEOUS | Status: DC
Start: 2020-02-14 — End: 2020-02-16
  Administered 2020-02-15: 02:00:00 3 [IU] via SUBCUTANEOUS

## 2020-02-14 MED ORDER — DEXTROSE 5 % IV SOLN
5 % | Freq: Once | INTRAVENOUS | Status: DC
Start: 2020-02-14 — End: 2020-02-14

## 2020-02-14 MED ORDER — GLUCOSE 40 % PO GEL
40 % | ORAL | Status: DC | PRN
Start: 2020-02-14 — End: 2020-02-16

## 2020-02-14 MED ORDER — POTASSIUM CHLORIDE CRYS ER 20 MEQ PO TBCR
20 MEQ | ORAL | Status: DC | PRN
Start: 2020-02-14 — End: 2020-02-16

## 2020-02-14 MED ORDER — PROMETHAZINE HCL 12.5 MG PO TABS
12.5 MG | Freq: Four times a day (QID) | ORAL | Status: DC | PRN
Start: 2020-02-14 — End: 2020-02-16

## 2020-02-14 MED ORDER — ATORVASTATIN CALCIUM 40 MG PO TABS
40 MG | Freq: Every evening | ORAL | Status: DC
Start: 2020-02-14 — End: 2020-02-16
  Administered 2020-02-15 – 2020-02-16 (×2): 40 mg via ORAL

## 2020-02-14 MED ORDER — ONDANSETRON HCL 4 MG/2ML IJ SOLN
4 MG/2ML | Freq: Once | INTRAMUSCULAR | Status: AC
Start: 2020-02-14 — End: 2020-02-14
  Administered 2020-02-14: 20:00:00 4 mg via INTRAVENOUS

## 2020-02-14 MED ORDER — POTASSIUM CHLORIDE 10 MEQ/100ML IV SOLN
10 | INTRAVENOUS | Status: DC | PRN
Start: 2020-02-14 — End: 2020-02-16

## 2020-02-14 MED ORDER — IOPAMIDOL 76 % IV SOLN
76 % | Freq: Once | INTRAVENOUS | Status: AC | PRN
Start: 2020-02-14 — End: 2020-02-14
  Administered 2020-02-14: 21:00:00 100 mL via INTRAVENOUS

## 2020-02-14 MED ORDER — MAGNESIUM SULFATE 2000 MG/50 ML IVPB PREMIX
2 GM/50ML | INTRAVENOUS | Status: DC | PRN
Start: 2020-02-14 — End: 2020-02-16

## 2020-02-14 MED ORDER — DEXTROSE 50 % IV SOLN
50 % | Freq: Once | INTRAVENOUS | Status: DC
Start: 2020-02-14 — End: 2020-02-14

## 2020-02-14 MED ORDER — INSULIN LISPRO 100 UNIT/ML SC SOLN
100 UNIT/ML | Freq: Three times a day (TID) | SUBCUTANEOUS | Status: DC
Start: 2020-02-14 — End: 2020-02-16
  Administered 2020-02-15: 17:00:00 10 [IU] via SUBCUTANEOUS
  Administered 2020-02-15: 21:00:00 4 [IU] via SUBCUTANEOUS
  Administered 2020-02-15: 13:00:00 2 [IU] via SUBCUTANEOUS

## 2020-02-14 MED ORDER — INSULIN REGULAR HUMAN 100 UNIT/ML IJ SOLN
100 UNIT/ML | Freq: Once | INTRAMUSCULAR | Status: AC
Start: 2020-02-14 — End: 2020-02-14
  Administered 2020-02-14: 22:00:00 10 [IU] via INTRAVENOUS

## 2020-02-14 MED ORDER — INSULIN GLARGINE 100 UNIT/ML SC SOLN
100 UNIT/ML | Freq: Two times a day (BID) | SUBCUTANEOUS | Status: DC
Start: 2020-02-14 — End: 2020-02-16
  Administered 2020-02-15: 02:00:00 50 [IU] via SUBCUTANEOUS

## 2020-02-14 MED ORDER — POTASSIUM BICARB-CITRIC ACID 20 MEQ PO TBEF
20 MEQ | ORAL | Status: DC | PRN
Start: 2020-02-14 — End: 2020-02-16

## 2020-02-14 MED ORDER — ASPIRIN 81 MG PO CHEW
81 MG | Freq: Every day | ORAL | Status: DC
Start: 2020-02-14 — End: 2020-02-16
  Administered 2020-02-15: 13:00:00 81 mg via ORAL

## 2020-02-14 MED ORDER — MORPHINE SULFATE (PF) 2 MG/ML IV SOLN
2 MG/ML | Freq: Once | INTRAVENOUS | Status: AC
Start: 2020-02-14 — End: 2020-02-14
  Administered 2020-02-14: 20:00:00 4 mg via INTRAVENOUS

## 2020-02-14 MED ORDER — SODIUM CHLORIDE 0.9 % IV BOLUS
0.9 % | Freq: Once | INTRAVENOUS | Status: AC
Start: 2020-02-14 — End: 2020-02-14
  Administered 2020-02-14: 22:00:00 1000 mL via INTRAVENOUS

## 2020-02-14 MED ORDER — MORPHINE SULFATE (PF) 2 MG/ML IV SOLN
2 MG/ML | Freq: Once | INTRAVENOUS | Status: AC
Start: 2020-02-14 — End: 2020-02-14
  Administered 2020-02-14: 22:00:00 4 mg via INTRAVENOUS

## 2020-02-14 MED ORDER — SODIUM CHLORIDE 0.9 % IV SOLN
0.9 | INTRAVENOUS | Status: DC | PRN
Start: 2020-02-14 — End: 2020-02-16

## 2020-02-14 MED ORDER — NORMAL SALINE FLUSH 0.9 % IV SOLN
0.9 | INTRAVENOUS | Status: DC | PRN
Start: 2020-02-14 — End: 2020-02-16

## 2020-02-14 MED ORDER — POLYETHYLENE GLYCOL 3350 17 G PO PACK
17 g | Freq: Every day | ORAL | Status: DC | PRN
Start: 2020-02-14 — End: 2020-02-16

## 2020-02-14 MED ORDER — METOPROLOL TARTRATE 50 MG PO TABS
50 MG | Freq: Once | ORAL | Status: AC
Start: 2020-02-14 — End: 2020-02-14
  Administered 2020-02-14: 20:00:00 50 mg via ORAL

## 2020-02-14 MED FILL — MORPHINE SULFATE 2 MG/ML IJ SOLN: 2 mg/mL | INTRAMUSCULAR | Qty: 2

## 2020-02-14 MED FILL — ONDANSETRON HCL 4 MG/2ML IJ SOLN: 4 MG/2ML | INTRAMUSCULAR | Qty: 2

## 2020-02-14 MED FILL — HUMALOG 100 UNIT/ML SC SOLN: 100 [IU]/mL | SUBCUTANEOUS | Qty: 2

## 2020-02-14 MED FILL — MONOJECT FLUSH SYRINGE 0.9 % IV SOLN: 0.9 % | INTRAVENOUS | Qty: 40

## 2020-02-14 MED FILL — SODIUM CHLORIDE 0.9 % IV SOLN: 0.9 % | INTRAVENOUS | Qty: 1000

## 2020-02-14 MED FILL — METOPROLOL TARTRATE 50 MG PO TABS: 50 mg | ORAL | Qty: 1

## 2020-02-14 NOTE — ED Provider Notes (Signed)
[facility]  EMERGENCY DEPARTMENT ENCOUNTER      Pt Name: Evan Crawford  MRN: [id number]  Birthdate 03-01-1981  Date of evaluation: 02/14/2020  Provider: Fayette Pho, APRN - CNP    CHIEF COMPLAINT       Chief Complaint   Patient presents with   ??? Chest Pain   ??? Hyperglycemia         HISTORY OF PRESENT ILLNESS   (Location/Symptom, Timing/Onset,Context/Setting, Quality, Duration, Modifying Factors, Severity)  Note limiting factors.   Ronda Kazmi is a 39 y.o. male who presents to the emergency department with a chart for past medical history of diabetes for a chief complaint of sudden onset 9 out of 10 sharp stabbing chest pain that is on the left side radiating to the left shoulder and radiating to the left side of the neck.  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  He reports that he took 7 of the 0.4 sublingual nitro and 9 of baby aspirin's.  The pain is actively present at this time.  He denies nausea or vomiting.  No other complaints at this time.    Nursing Notes were reviewed.    REVIEW OF SYSTEMS    (2-9 systems for level 4, 10 or more for level 5)     Review of Systems   Constitutional: Positive for diaphoresis. Negative for activity change, appetite change, fatigue and fever.   HENT: Negative for congestion, ear pain, rhinorrhea, sore throat and trouble swallowing.    Eyes: Negative for pain, discharge and redness.   Respiratory: Positive for chest tightness and shortness of breath. Negative for cough and wheezing.    Cardiovascular: Positive for chest pain. Negative for palpitations.   Gastrointestinal: Negative for abdominal pain, blood in stool, constipation, diarrhea, nausea and vomiting.   Endocrine: Negative for polydipsia and polyuria.   Genitourinary: Negative for  decreased urine volume, dysuria, flank pain and hematuria.   Musculoskeletal: Positive for neck pain. Negative for arthralgias, back pain and myalgias.   Skin: Negative for color change, rash and wound.   Neurological: Negative for dizziness, syncope, weakness, light-headedness and headaches.   Psychiatric/Behavioral: Negative for behavioral problems.   All other systems reviewed and are negative.      Except as noted above the remainder of the review of systems was reviewed and negative.       PAST MEDICAL HISTORY     Past Medical History:   Diagnosis Date   ??? Diabetes mellitus (HCC)      No past surgical history on file.  Social History     Socioeconomic History   ??? Marital status: Single     Spouse name: None   ??? Number of children: None   ??? Years of education: None   ??? Highest education level: None   Occupational History   ??? None   Tobacco Use   ??? Smoking status: None   Substance and Sexual Activity   ??? Alcohol use: None   ??? Drug use: None   ??? Sexual activity: None   Other Topics Concern   ??? None   Social History Narrative   ??? None     Social Determinants of Health     Financial Resource Strain:    ???  Difficulty of Paying Living Expenses:    Food Insecurity:    ??? Worried About Charity fundraiser in the Last Year:    ??? Arboriculturist in the Last Year:    Transportation Needs:    ??? Film/video editor (Medical):    ??? Lack of Transportation (Non-Medical):    Physical Activity:    ??? Days of Exercise per Week:    ??? Minutes of Exercise per Session:    Stress:    ??? Feeling of Stress :    Social Connections:    ??? Frequency of Communication with Friends and Family:    ??? Frequency of Social Gatherings with Friends and Family:    ??? Attends Religious Services:    ??? Marine scientist or Organizations:    ??? Attends Music therapist:    ??? Marital Status:    Human resources officer Violence:    ??? Fear of Current or Ex-Partner:    ??? Emotionally Abused:    ??? Physically Abused:    ??? Sexually Abused:         SCREENINGS    Glasgow Coma Scale  Eye Opening: Spontaneous  Best Verbal Response: Oriented  Best Motor Response: Obeys commands  Glasgow Coma Scale Score: 15        PHYSICAL EXAM    (up to 7 for level 4, 8 or more for level 5)     ED Triage Vitals [02/14/20 1555]   BP Temp Temp Source Pulse Resp SpO2 Height Weight   (!) 129/100 98.2 ??F (36.8 ??C) Oral 111 18 98 % 6\' 1"  (1.854 m) 210 lb (95.3 kg)       Physical Exam  Vitals and nursing note reviewed.   Constitutional:       General: He is not in acute distress.     Appearance: He is well-developed. He is not diaphoretic.   HENT:      Head: Normocephalic and atraumatic.      Nose: Nose normal.   Eyes:      Conjunctiva/sclera: Conjunctivae normal.      Pupils: Pupils are equal, round, and reactive to light.   Cardiovascular:      Rate and Rhythm: Regular rhythm. Tachycardia present.      Heart sounds: Normal heart sounds.   Pulmonary:      Effort: Pulmonary effort is normal. No respiratory distress.      Breath sounds: Normal breath sounds. No wheezing.   Abdominal:      General: Bowel sounds are normal.      Palpations: Abdomen is soft.      Tenderness: There is no abdominal tenderness.   Musculoskeletal:      Cervical back: Normal range of motion and neck supple.   Skin:     General: Skin is warm and dry.      Capillary Refill: Capillary refill takes less than 2 seconds.      Findings: No rash.   Neurological:      Mental Status: He is alert and oriented to person, place, and time.      Cranial Nerves: No cranial nerve deficit.   Psychiatric:         Behavior: Behavior normal.         RESULTS     EKG: All EKG's are interpreted by the Emergency Department Physician who either signs or Co-signsthis chart in the absence of a cardiologist.    Sinus tachycardia rate of 113 bpm,  no ST elevations, QT of 322    RADIOLOGY:   Non-plain filmimages such as CT, Ultrasound and MRI are read by the radiologist. Plain radiographic images are visualized and preliminarily  interpreted by the emergency physician with the below findings:        Interpretation per the Radiologist below, if available at the time ofthis note:    CTA Chest W WO  (PE study)   Final Result   1.  NO EVIDENCE OF CENTRAL OR PROXIMAL OR PROXIMAL SEGMENTAL PORT EMBOLI   2.  FOCAL AREA OF INFILTRATE CONSOLIDATION, PARTIAL COLLAPSE WITH AIR BRONCHOGRAMS OF THE LEFT LOWER LOBE LEFT LINGULAR REGION. UNDERLYING MALIGNANCY IS NOT EXCLUDED. WILL NEED FOLLOW-UP AND FURTHER EVALUATION.       All CT scans at this facility use dose modulation, iterative reconstruction, and/or weight based dosing when appropriate to reduce radiation dose to as low as reasonably achievable.            XR CHEST PORTABLE   Final Result      Patchy opacity at the left lung base, likely infectious in etiology.                  ED BEDSIDE ULTRASOUND:   Performed by ED Physician - none    LABS:  Labs Reviewed   COMPREHENSIVE METABOLIC PANEL - Abnormal; Notable for the following components:       Result Value    Sodium 132 (*)     Glucose 559 (*)     Alkaline Phosphatase 178 (*)     ALT 43 (*)     All other components within normal limits    Narrative:     CALL  [facility] tel. [phone],  GLU results called to and read back by Ginger Romes, 02/14/2020 17:01, by  POIJA   CBC WITH AUTO DIFFERENTIAL - Abnormal; Notable for the following components:    Monocytes Absolute 0.9 (*)     All other components within normal limits   POCT GLUCOSE - Abnormal; Notable for the following components:    POC Glucose 532 (*)     All other components within normal limits   POCT VENOUS - Abnormal; Notable for the following components:    pH, Ven 7.484 (*)     pCO2, Ven 29.6 (*)     HCO3, Venous 22.2 (*)     Lactate 2.80 (*)     All other components within normal limits   POCT GLUCOSE - Abnormal; Notable for the following components:    POC Glucose 360 (*)     All other components within normal limits   POCT GLUCOSE - Abnormal; Notable for the following components:     POC Glucose 264 (*)     All other components within normal limits   MAGNESIUM    Narrative:     CALL  [facility] tel. [phone],  GLU results called to and read back by Ginger Romes, 02/14/2020 17:01, by  POIJA   TROPONIN   TROPONIN   TROPONIN   CBC   BASIC METABOLIC PANEL W/ REFLEX TO MG FOR LOW K   HEMOGLOBIN A1C       All other labs were within normal range or not returned as of this dictation.    EMERGENCY DEPARTMENT COURSE and DIFFERENTIAL DIAGNOSIS/MDM:   Vitals:    Vitals:    02/14/20 1815 02/14/20 1820 02/14/20 1830 02/14/20 1907   BP: (!) 134/95 (!) 134/95 (!) 134/91 128/88  Pulse: 113 113 114 108   Resp: 14 22 28 18    Temp:    99.7 ??F (37.6 ??C)   TempSrc:    Oral   SpO2: 99% 100% 100% 97%   Weight:       Height:                MDM   Patient is a 39 year old male presenting to the ER with a chief complaint of sudden onset chest pain while driving.  Hemodynamically stable nontoxic-appearing.  Medicated with morphine.  I spoke to Dr. 30 immediately and sent him the EKG which he thinks is normal.  Awaiting blood work results and will be reassessing patient.     Dr. Theodoro Grist came down and assessed the patient in person.  He does not necessarily think that the patient is having an active heart attack at this time.  He recommended obtaining a CT chest with and without contrast.    Patient is hyperglycemic and treated with 10 units of IV insulin given a liter of fluid in the emergency department.    CT chest showing left-sided consolidation with partial collapse.  I spoke to Dr. Theodoro Grist and he would still like patient to be observed overnight and he will be consulting on the case.  Spoke to Dr. Theodoro Grist, hospitalist, who is admitting patient to service for further evaluation and management of the chest pain, hyperglycemia, and possible pneumonia identified on CT.  I say possible because the patient has not been complaining of a fever, cough, or previous shortness of breath.  Patient is admitted in stable condition with  stable vital signs.    CRITICAL CARE TIME       CONSULTS:  IP CONSULT TO CARDIOLOGY  IP CONSULT TO DIABETES EDUCATOR  IP CONSULT TO PULMONOLOGY    PROCEDURES:  Unless otherwise noted below, none     Procedures    FINAL IMPRESSION      1. Chest pain, unspecified type    2. Hyperglycemia          DISPOSITION/PLAN   DISPOSITION        PATIENT REFERRED TO:  No follow-up provider specified.    DISCHARGE MEDICATIONS:  Current Discharge Medication List             (Please notethat portions of this note were completed with a voice recognition program.  Efforts were made to edit the dictations but occasionally words are mis-transcribed.)    Alfredia Ferguson, APRN - CNP (electronically signed)  Attending Emergency Physician          [facility], APRN - CNP  02/15/20 [phone]

## 2020-02-14 NOTE — ED Notes (Signed)
Dr. Alfredia Ferguson at bedside     Larena Sox, RN  02/14/20 [phone]

## 2020-02-14 NOTE — ED Notes (Signed)
Pt awake and alert. No s/s of distress noted at this time. No concerns or needs at this time. Will continue to monitor.        Larena Sox, RN  02/14/20 [phone]

## 2020-02-14 NOTE — ED Notes (Signed)
Pt awake and alert. No s/s of distress noted at this time. No concerns or needs at this time. Will continue to monitor.        Larena Sox, RN  02/14/20 1730

## 2020-02-14 NOTE — ED Notes (Signed)
Pt reports decrease in pain in chest pain and neck pain. Pt rates 4/10.     Larena Sox, RN  02/14/20 1705

## 2020-02-14 NOTE — ED Notes (Signed)
Pt awake and alert. No s/s of distress noted at this time. No concerns or needs at this time. Will continue to monitor.        Larena Sox, RN  02/14/20 1704

## 2020-02-14 NOTE — H&P (Signed)
Department of Internal Medicine  General Internal Medicine  Attending History and Physical      CHIEF COMPLAINT:  CP    Reason for Admission:  Unstable angina, hyperglycemia    History Obtained From:  patient    HISTORY OF PRESENT ILLNESS:      The patient is a 39 y.o. male with significant past medical history of CAD sp 7 stents, IDDM, HTN who presents with chest pain which started around 4am this morning. Pt is a Naval architect. Primary residence is in [place]. Pt states that he was driving and started feeling severe left sided chest pain this AM when he was around [place]. Pt states that over the course of the day, he took about 7 nitro, not at once, but only half of which relieved his chest pain which radiated to his neck and left shoulder. States that he also took aspirin as well. States he kept pushing through, hoping it would go away and finally had to come to the ED when he made it here. In the ED, workup remarkable only for hyperglycemia which was noted to be 532 at 4pm. ED notified cardiology given his history and they initially accepted the patient. ED CTA'd the patient which was negative for PE, but showed some left sided atelectasis and mentioned infiltrate so cardiology deferred admission to hospitalist. Pt asymptomatic - no fevers, chills, cough, congestion, no leukocytosis. After 2 hours in the ED, glucose was addressed with 1L NS and insulin. Pt states that he takes Lantus 50U BID and ISS. Denies smoking history. Denies any other symptoms except chest pain. No associated diaphoresis or sob. Initial troponin negative. Pt admitted for unstable angina with consult to cardiology.    Past Medical History:        Diagnosis Date   ??? Diabetes mellitus (HCC)      Past Surgical History:    No past surgical history on file.    Medications Prior to Admission:    Not in a hospital admission.    Allergies:  Patient has no known allergies.    Social History:   Denies tobacco, etoh, drugs    Family History:    No family history on file.  REVIEW OF SYSTEMS:  12 point ROS was negative unless otherwise noted in the HPI   PHYSICAL EXAM:    Vitals:  BP (!) 134/91    Pulse 114    Temp 98.2 ??F (36.8 ??C) (Oral)    Resp 28    Ht 6\' 1"  (1.854 m)    Wt 210 lb (95.3 kg)    SpO2 100%    BMI 27.71 kg/m??     Constitutional: Awake and alert in no acute distress. Lying in bed comfortably  Head: Normocephalic, atraumatic  Eyes: EOMI, PERRLA  ENT: moist mucous membranes  Neck: neck supple, trachea midline  Lungs: Good inspiratory effort, CTABL, no wheeze, no rhonchi, no rales  Heart: RRR, normal S1 and S2  GI: Soft, non-distended, non tender, no guarding, no rebound, +BS  MSK: no edema noted  Skin: warm, dry  Psych: appropriate affect       DATA:  CBC:   Lab Results   Component Value Date    WBC 9.0 02/14/2020    RBC 5.46 02/14/2020    HGB 15.1 02/14/2020    HCT 43.8 02/14/2020    MCV 80.3 02/14/2020    MCH 27.6 02/14/2020    MCHC 34.4 02/14/2020    RDW 13.2 02/14/2020  PLT 351 02/14/2020     CMP:    Lab Results   Component Value Date    NA 132 02/14/2020    K 4.2 02/14/2020    CL 95 02/14/2020    CO2 22 02/14/2020    BUN 19 02/14/2020    CREATININE 0.81 02/14/2020    GFRAA >60.0 02/14/2020    LABGLOM >60.0 02/14/2020    GLUCOSE 559 02/14/2020    PROT 7.3 02/14/2020    LABALBU 4.0 02/14/2020    CALCIUM 9.7 02/14/2020    BILITOT 0.3 02/14/2020    ALKPHOS 178 02/14/2020    AST 27 02/14/2020    ALT 43 02/14/2020     Troponin:    Lab Results   Component Value Date    TROPONINI <0.010 02/14/2020     ASSESSMENT AND PLAN:      # Unstable angina with history of CAD sp PCI  - troponin negative - will trend  - cardiology consulted  - resume home asa, brilinta, statin, imdur, bb  - tele  - prn nitro    # IDDM with hyperglycemia  - resume home regimen of Lantus 50U BID and ISS  - given 1L NS in ED and 10U insulin  - monitor    # Abnormal CT lung findings  - denies smoking hx  - left sided atelectasis likely due to poor inspiration while driving all  day, but mentioned concern for malignancy, so will consult pulm   - afebrile, no infection    DVT: lovenox    Disposition: Pt with unstable angina and hyperglycemia. Cardiology consulted. Anticipated length of stay greater than 48 hours.      Rowe Pavy, DO  Internal Medicine

## 2020-02-14 NOTE — ED Notes (Signed)
Pt awake and alert. No s/s of distress noted at this time. No concerns or needs at this time. Will continue to monitor.   Pt talking on phone.     Larena Sox, RN  02/14/20 1642

## 2020-02-14 NOTE — ED Notes (Signed)
Report to Federico Flake, RN  02/14/20 [phone]

## 2020-02-14 NOTE — ED Notes (Signed)
Pt awake and alert. No s/s of distress noted at this time. No concerns or needs at this time. Will continue to monitor.        Tayleigh Wetherell, RN  02/14/20 1753

## 2020-02-14 NOTE — ED Notes (Signed)
Ct ready

## 2020-02-14 NOTE — ED Notes (Signed)
Merna aware of glucose     Larena Sox, RN  02/14/20 [phone]

## 2020-02-14 NOTE — Consults (Signed)
Consults    Patient Name: Evan Crawford  Admit Date: 02/14/2020  3:54 PM  MR #: [id number]  DOB: Jun 14, 1981    Attending Physician: No att. providers found  Reason for consult: CP    History of Presenting Illness:      Evan Crawford is a 39 y.o. male on hospital day 0 with a history of .   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  Developed severe Central CP neck pain and Left shoulder pain.   Took 7 SL NTG and 9 ASA with minimal relief.    ECG Benign  Inita Trop Negative    CT chest no PE but partial collapsed Left Lung    He dose have CAD with 2 proor stents in [place] to LAD and RCA.  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 Diabetes mellitus (HCC)      No past surgical history on file.    Family History  No family history on file.  []  Unable to obtain due to ventilated and/ or neurologic status    Social History     Socioeconomic History   ??? Marital status: Single     Spouse name: Not on file   ??? Number of children: Not on file   ??? Years of education: Not on file   ??? Highest education level: Not on file   Occupational History   ??? Not on file   Tobacco Use   ??? Smoking status: Not on file   Substance and Sexual Activity   ??? Alcohol use: Not on file   ??? Drug use: Not on file   ??? Sexual activity: Not on file   Other Topics Concern   ??? Not on file   Social History Narrative   ??? Not on file     Social Determinants of Health     Financial Resource Strain:    ??? Difficulty of Paying Living Expenses:    Food Insecurity:    ??? Worried About in the Last Year:    ??? Programme researcher, broadcasting/film/video in the Last Year:    Transportation Needs:    ??? Barista (Medical):    ??? Lack of Transportation (Non-Medical):    Physical Activity:    ??? Days of Exercise per Week:    ??? Minutes of Exercise per Session:    Stress:    ??? Feeling of Stress :    Social Connections:    ??? Frequency of Communication with  Friends and Family:    ??? Frequency of Social Gatherings with Friends and Family:    ??? Attends Religious Services:    ??? Freight forwarder or Organizations:    ??? Attends Database administrator:    ??? Marital Status:    Intimate Engineer, structural Violence:    ??? Fear of Current or Ex-Partner:    ??? Emotionally Abused:    ??? Physically Abused:    ??? Sexually Abused:       []  Unable to obtain due to ventilated and/ or neurologic status      Home Medications:      Not in a hospital admission.    Current Hospital Medications:     Scheduled Meds:  Continuous Infusions:  PRN Meds:.  Programme researcher, broadcasting/film/video  Allergies:     No Known Allergies     Review of Systems:       Review of Systems   Constitutional: Negative.  Negative for diaphoresis and fatigue.   HENT: Negative.    Eyes: Negative.    Respiratory: Negative.  Negative for cough, chest tightness, shortness of breath, wheezing and stridor.    Cardiovascular: Positive for chest pain. Negative for palpitations and leg swelling.   Gastrointestinal: Negative.  Negative for blood in stool and nausea.   Genitourinary: Negative.    Musculoskeletal: Negative.    Skin: Negative.    Neurological: Negative.  Negative for dizziness, syncope, weakness and light-headedness.   Hematological: Negative.    Psychiatric/Behavioral: Negative.          Objective Findings:     Vitals:BP 122/89    Pulse 107    Temp 98.2 ??F (36.8 ??C) (Oral)    Resp 18    Ht 6\' 1"  (1.854 m)    Wt 210 lb (95.3 kg)    SpO2 98%    BMI 27.71 kg/m??      Physical Examination:    Physical Exam   Constitutional: No distress. He appears acutely ill.   HENT:   Normal cephalic and Atraumatic   Eyes: Pupils are equal, round, and reactive to light.   Neck: Thyroid normal. No JVD present. No neck adenopathy. No thyromegaly present.   Cardiovascular: Normal rate, regular rhythm, normal heart sounds, intact distal pulses and normal pulses.   Pulmonary/Chest: Effort normal and breath sounds normal. He has no wheezes. He has no rales. He exhibits no  tenderness.   Abdominal: Soft. Bowel sounds are normal. There is no abdominal tenderness.   Musculoskeletal:         General: No tenderness or edema. Normal range of motion.      Cervical back: Normal range of motion and neck supple.   Neurological: He is alert and oriented to person, place, and time.   Skin: Skin is warm. No cyanosis. Nails show no clubbing.         Results/ Medications reviewed 02/14/2020, 5:42 PM     Laboratory, Microbiology, Pathology, Radiology, Cardiology, Medications and Transcriptions reviewed  Scheduled Meds:  Continuous Infusions:    Recent Labs     02/14/20  1615   WBC 9.0   HGB 15.1   HCT 43.8   MCV 80.3   PLT 351     Recent Labs     02/14/20  1615   NA 132*   K 4.2   CL 95   CO2 22   BUN 19   CREATININE 0.81     Recent Labs     02/14/20  1615   AST 27   ALT 43*   BILITOT 0.3   ALKPHOS 178*     No results for input(s): LIPASE, AMYLASE in the last 72 hours.  Recent Labs     02/14/20  1615   PROT 7.3     CTA Chest W WO  (PE study)    Result Date: 02/14/2020  The EXAMINATION: CT scan of the chest with contrast (pulmonary embolism protocol) INDICATION: Left-sided chest pain. Short of breath COMPARISON: None TECHNIQUE: Helical CT was performed through the chest utilizing 100 cc of Isovue-300 intravenous contrast.  Images were obtained with bolus tracking in order to opacify the pulmonary arteries.  Both MIP and 3D volume rendered reconstructions were performed. FINDINGS: There are no findings a central, proximal or proximal-segmental pulmonary emboli.  There is an area of focal area of infiltrate consolidation, within the base of the left upper lobe left lingular region with air bronchograms. No pleural effusions. No pneumothoraces. No significant periaortic, pretracheal, parahilar or subcarinal adenopathy is not excluded. The field-of-view visualized abdominal contents are unremarkable.. Osseous structures are intact.      1.  NO EVIDENCE OF CENTRAL OR PROXIMAL OR PROXIMAL SEGMENTAL PORT EMBOLI  2.  FOCAL AREA OF INFILTRATE CONSOLIDATION, PARTIAL COLLAPSE WITH AIR BRONCHOGRAMS OF THE LEFT LOWER LOBE LEFT LINGULAR REGION. UNDERLYING MALIGNANCY IS NOT EXCLUDED. WILL NEED FOLLOW-UP AND FURTHER EVALUATION. All CT scans at this facility use dose modulation, iterative reconstruction, and/or weight based dosing when appropriate to reduce radiation dose to as low as reasonably achievable.       There are no active hospital problems to display for this patient.        Impression/Plan:   1. Partial Left Lung collapse  2. CP - does not appear to be angina but obtain serial Troponin.  3. Will check Echo   4. HTN Stable  5. CAD- ASA BB Statin      Thank you for allowing [place] to participate in the care of this patient.     Will continue to follow.    Please call if questions or concerns arise.    Electronically signed by Harl Favor, MD on 02/14/2020 at 5:42 PM

## 2020-02-14 NOTE — ED Notes (Signed)
Dr. Theodoro Grist at bedside     Larena Sox, RN  02/14/20 1626

## 2020-02-14 NOTE — ED Notes (Signed)
Ct waiting on creat

## 2020-02-14 NOTE — ED Notes (Signed)
Pt awake and alert. No s/s of distress noted at this time. No concerns or needs at this time. Will continue to monitor.        Larena Sox, RN  02/14/20 [phone]

## 2020-02-14 NOTE — ED Notes (Signed)
Bed: 05  Expected date: 02/14/20  Expected time:   Means of arrival:   Comments:  39 year old male chest pain. Pt took 7 of nitro. 144/80, 112, 98% ra. [employer] touch [address], RN  02/14/20 1554

## 2020-02-14 NOTE — ED Triage Notes (Signed)
Pt arrived with complaint of chest pain. Pt reports the pain is located left chest and left neck. Pt reports the pain started at 0400 today.  Pt reports they have taken 7- 0.4 mg nitro and 9- 81 mg Asprin. Pt rates the pain 9/10. On a pain scale 1-10, 10 being the highest. Pt reports the pain feels like aching. Pt reports the pain radiates from left chest to left neck. Pt reports the pain is constant. Pt denies shortness of breath, weakness, fatigue, fever, cold sweats, dizziness, vomiting or nausea. Pt reports a history of heart attacks and stents x 7. Pt is A & O x 4.

## 2020-02-15 ENCOUNTER — Inpatient Hospital Stay: Admit: 2020-02-15 | Payer: MEDICARE

## 2020-02-15 DIAGNOSIS — J189 Pneumonia, unspecified organism: Secondary | ICD-10-CM

## 2020-02-15 LAB — POCT GLUCOSE
POC Glucose: 264 mg/dl — ABNORMAL HIGH (ref 60–115)
POC Glucose: 187 mg/dl — ABNORMAL HIGH (ref 60–115)
POC Glucose: 183 mg/dl — ABNORMAL HIGH (ref 60–115)
POC Glucose: 290 mg/dl — ABNORMAL HIGH (ref 60–115)
POC Glucose: 202 mg/dl — ABNORMAL HIGH (ref 60–115)

## 2020-02-15 LAB — BASIC METABOLIC PANEL W/ REFLEX TO MG FOR LOW K
Anion Gap: 10 mEq/L (ref 9–15)
BUN: 15 mg/dL (ref 6–20)
CO2: 24 mEq/L (ref 20–31)
Calcium: 8.7 mg/dL (ref 8.5–9.9)
Chloride: 102 mEq/L (ref 95–107)
Creatinine: 0.64 mg/dL — ABNORMAL LOW (ref 0.70–1.20)
GFR African American: >60 (ref >60)
GFR Non-African American: >60 (ref >60)
Glucose: 223 mg/dL — ABNORMAL HIGH (ref 70–99)
Potassium reflex Magnesium: 3.6 mEq/L (ref 3.4–4.9)
Sodium: 136 mEq/L (ref 135–144)

## 2020-02-15 LAB — EKG 12-LEAD
Atrial Rate: 113 {beats}/min
Atrial Rate: 113 {beats}/min
P Axis: 39 degrees
P Axis: 71 degrees
P-R Interval: 152 ms
P-R Interval: 156 ms
Q-T Interval: 312 ms
Q-T Interval: 322 ms
QRS Duration: 70 ms
QRS Duration: 72 ms
QTc Calculation (Bazett): 427 ms
QTc Calculation (Bazett): 441 ms
R Axis: 33 degrees
R Axis: 71 degrees
T Axis: 28 degrees
T Axis: 75 degrees
Ventricular Rate: 113 {beats}/min
Ventricular Rate: 113 {beats}/min

## 2020-02-15 LAB — CBC
Hematocrit: 43 % (ref 42.0–52.0)
Hemoglobin: 14.6 g/dL (ref 14.0–18.0)
MCH: 27.2 pg (ref 27.0–31.3)
MCHC: 33.8 % (ref 33.0–37.0)
MCV: 80.3 fL (ref 80.0–100.0)
Platelets: 345 10*3/uL (ref 130–400)
RBC: 5.36 M/uL (ref 4.70–6.10)
RDW: 13.5 % (ref 11.5–14.5)
WBC: 8.5 10*3/uL (ref 4.8–10.8)

## 2020-02-15 LAB — ECHOCARDIOGRAM COMPLETE 2D W DOPPLER W COLOR: Left Ventricular Ejection Fraction: 60

## 2020-02-15 LAB — TROPONIN
Troponin: <0.01 ng/mL (ref 0.000–0.010)
Troponin: <0.01 ng/mL (ref 0.000–0.010)

## 2020-02-15 LAB — TSH WITH REFLEX: TSH: 1.48 u[IU]/mL (ref 0.440–3.860)

## 2020-02-15 LAB — PROCALCITONIN: Procalcitonin: 0.15 ng/mL (ref 0.00–0.15)

## 2020-02-15 LAB — HEMOGLOBIN A1C: Hemoglobin A1C: 13.8 % — ABNORMAL HIGH (ref 4.8–5.9)

## 2020-02-15 MED ORDER — PANTOPRAZOLE SODIUM 40 MG PO TBEC
40 MG | Freq: Every day | ORAL | Status: DC
Start: 2020-02-15 — End: 2020-02-16
  Administered 2020-02-15: 17:00:00 40 mg via ORAL

## 2020-02-15 MED ORDER — LEVOFLOXACIN IN D5W 750 MG/150ML IV SOLN
750 MG/150ML | INTRAVENOUS | Status: DC
Start: 2020-02-15 — End: 2020-02-16
  Administered 2020-02-15: 20:00:00 750 mg via INTRAVENOUS

## 2020-02-15 MED FILL — BRILINTA 90 MG PO TABS: 90 mg | ORAL | Qty: 1

## 2020-02-15 MED FILL — CHILDRENS ASPIRIN 81 MG PO CHEW: 81 mg | ORAL | Qty: 1

## 2020-02-15 MED FILL — LANTUS 100 UNIT/ML SC SOLN: 100 [IU]/mL | SUBCUTANEOUS | Qty: 50

## 2020-02-15 MED FILL — ACETAMINOPHEN 325 MG PO TABS: 325 mg | ORAL | Qty: 2

## 2020-02-15 MED FILL — ENOXAPARIN SODIUM 40 MG/0.4ML SC SOLN: 40 MG/0.4ML | SUBCUTANEOUS | Qty: 0.4

## 2020-02-15 MED FILL — ATORVASTATIN CALCIUM 40 MG PO TABS: 40 mg | ORAL | Qty: 1

## 2020-02-15 MED FILL — ISOSORBIDE MONONITRATE ER 60 MG PO TB24: 60 mg | ORAL | Qty: 1

## 2020-02-15 MED FILL — METOPROLOL SUCCINATE ER 100 MG PO TB24: 100 mg | ORAL | Qty: 1

## 2020-02-15 MED FILL — LEVOFLOXACIN IN D5W 750 MG/150ML IV SOLN: 750 MG/150ML | INTRAVENOUS | Qty: 150

## 2020-02-15 MED FILL — NICOTINE 14 MG/24HR TD PT24: 14 mg/(24.h) | TRANSDERMAL | Qty: 1

## 2020-02-15 NOTE — Progress Notes (Signed)
Patient sleeping upon arrival.  Left business card and Diabetes Education packet with survival skills. Pt lives in [place]. [place]. Nurse will contact us if patient wants [place] to meet with him.

## 2020-02-15 NOTE — Discharge Summary (Addendum)
Discharge Summary    Date: 04/02/2020  Patient Name: Evan Crawford Date of Birth: 09-22-1980 Age: 39 y.o.    Admit Date: 02/14/2020  Discharge Date: 02/15/2020  Discharge Condition:    Admission Diagnosis  Chest pain (R07.9)     Discharge Diagnosis  Active Problems: Chest painResolved Problems: * No resolved hospital problems. [facility] Stay  Narrative of Hospital Course:  Chest pain  Unstable angina with h/o CAD and PCI  -Trops negative  -Cardiology consulted  - Continue home medications  - Consider other etiology-atelectasis, GERD as possible causes.   ??  IDDM with hyperglycemia  -Continue home regimen, A1c still pending  ??  Abnormal CT lung findings  - denies smoking hx  - left sided atelectasis likely due to poor inspiration while driving all day, but mentioned concern for malignancy  - afebrile, no infection  -Pulm consulted  ??  Tachycardia: Check TSH  ??  DVT: lovenox      Pt left AMA after I completed shift, no PE or med recc was able to be performed. Please see progress note from this date.    Consultants:  IP CONSULT TO CARDIOLOGYIP CONSULT TO DIABETES EDUCATORIP CONSULT TO PULMONOLOGY    Surgeries/procedures Performed:       Treatments:           Discharge Plan/Disposition:  AMA    Hospital/Incidental Findings Requiring Follow Up:    Patient Instructions:    Diet:    Activity:  For number of days (if applicable):     Other Instructions:    Provider Follow-Up:   No follow-ups on file.     Significant Diagnostic Studies:    Recent Labs:  Admission on 02/14/2020, Discharged on 05/27/2021Ventricular Rate                        Date: 05/26/2021Value: 113         Ref range: BPM                Status: FinalAtrial Rate                                   Date: 05/26/2021Value: 113         Ref range: BPM                Status: FinalP-R Interval                                  Date: 05/26/2021Value: 152         Ref range: ms                 Status: FinalQRS Duration                                  Date:  05/26/2021Value: 70          Ref range: ms                 Status: FinalQ-T Interval                                  Date: 05/26/2021Value: 322         Ref  range: ms                 Status: FinalQTc Calculation (Bazett)                      Date: 05/26/2021Value: 441         Ref range: ms                 Status: FinalP Axis                                        Date: 05/26/2021Value: 71          Ref range: degrees            Status: FinalR Axis                                        Date: 05/26/2021Value: 71          Ref range: degrees            Status: FinalT Axis                                        Date: 05/26/2021Value: 75          Ref range: degrees            Status: FinalSodium                                        Date: 05/26/2021Value: 132*        Ref range: 135 - 144 mEq/L    Status: FinalPotassium                                     Date: 05/26/2021Value: 4.2         Ref range: 3.4 - 4.9 mEq/L    Status: FinalChloride                                      Date: 05/26/2021Value: 95          Ref range: 95 - 107 mEq/L     Status: FinalCO2                                           Date: 05/26/2021Value: 22          Ref range: 20 - 31 mEq/L      Status: FinalAnion Gap                                     Date: 05/26/2021Value: 15          Ref range: 9 - 15 mEq/L       Status: FinalGlucose  Date: 05/26/2021Value: 559*        Ref range: 70 - 99 mg/dL      Status: FinalBUN                                           Date: 05/26/2021Value: 27          Ref range: 6 - 20 mg/dL       Status: FinalCREATININE                                    Date: 05/26/2021Value: 0.81        Ref range: 0.70 - 1.20 mg/dL  Status: FinalGFR Non-African American                      Date: 05/26/2021Value: >60.0       Ref range: >60                Status: Final              Comment: >60 mL/min/1.83m EGFR, calc. for ages 19and older using theMDRD formula (not corrected for weight), is valid for  stablerenal function.GFR African American                          Date: 05/26/2021Value: >60.0       Ref range: >60                Status: Final              Comment: >60 mL/min/1.787mEGFR, calc. for ages 1832nd older using theMDRD formula (not corrected for weight), is valid for stablerenal function.Calcium                                       Date: 05/26/2021Value: 9.7         Ref range: 8.5 - 9.9 mg/dL    Status: FinalTotal Protein                                 Date: 05/26/2021Value: 7.3         Ref range: 6.3 - 8.0 g/dL     Status: FinalAlbumin                                       Date: 05/26/2021Value: 4.0         Ref range: 3.5 - 4.6 g/dL     Status: FinalTotal Bilirubin                               Date: 05/26/2021Value: 0.3         Ref range: 0.2 - 0.7 mg/dL    Status: FinalAlkaline Phosphatase                          Date: 05/26/2021Value: 178*        Ref range: 35 - 104 U/L  Status: FinalALT                                           Date: 05/26/2021Value: 68*         Ref range: 0 - 41 U/L         Status: FinalAST                                           Date: 05/26/2021Value: 27          Ref range: 0 - 40 U/L         Status: FinalGlobulin                                      Date: 05/26/2021Value: 3.3         Ref range: 2.3 - 3.5 g/dL     Status: [place]                                           Date: 05/26/2021Value: 9.0         Ref range: 4.8 - 10.8 K/uL    Status: FinalRBC                                           Date: 05/26/2021Value: 5.46        Ref range: 4.70 - 6.10 M/uL   Status: FinalHemoglobin                                    Date: 05/26/2021Value: 15.1        Ref range: 14.0 - 18.0 g/dL   Status: FinalHematocrit                                    Date: 05/26/2021Value: 43.8        Ref range: 42.0 - 52.0 %      Status: FinalMCV                                           Date: 05/26/2021Value: 80.3        Ref range: 80.0 - 100.0 fL    Status: [place]                                            Date: 05/26/2021Value: 27.6        Ref range: 27.0 - 31.3 pg     Status: [place]  Date: 05/26/2021Value: 34.4        Ref range: 33.0 - 37.0 %      Status: FinalRDW                                           Date: 05/26/2021Value: 13.2        Ref range: 11.5 - 14.5 %      Status: FinalPlatelets                                     Date: 05/26/2021Value: 351         Ref range: 130 - 400 K/uL     Status: FinalNeutrophils %                                 Date: 05/26/2021Value: 68.7        Ref range: %                  Status: FinalLymphocytes %                                 Date: 05/26/2021Value: 19.7        Ref range: %                  Status: FinalMonocytes %                                   Date: 05/26/2021Value: 9.5         Ref range: %                  Status: FinalEosinophils %                                 Date: 05/26/2021Value: 1.6         Ref range: %                  Status: FinalBasophils %                                   Date: 05/26/2021Value: 0.5         Ref range: %                  Status: FinalNeutrophils Absolute                          Date: 05/26/2021Value: 6.2         Ref range: 1.4 - 6.5 K/uL     Status: FinalLymphocytes Absolute                          Date: 05/26/2021Value: 1.8         Ref range: 1.0 - 4.8 K/uL     Status: FinalMonocytes Absolute  Date: 05/26/2021Value: 0.9*        Ref range: 0.2 - 0.8 K/uL     Status: FinalEosinophils Absolute                          Date: 05/26/2021Value: 0.1         Ref range: 0.0 - 0.7 K/uL     Status: FinalBasophils Absolute                            Date: 05/26/2021Value: 0.0         Ref range: 0.0 - 0.2 K/uL     Status: FinalMagnesium                                     Date: 05/26/2021Value: 1.8         Ref range: 1.7 - 2.4 mg/dL    Status: FinalTroponin                                      Date: 05/26/2021Value: <0.010      Ref range: 0.000 - 0.010 ng*  Status:  Final              Comment: Methodology by Troponin T.POC Glucose                                   Date: 05/26/2021Value: 532*        Ref range: 60 - 115 mg/dl     Status: FinalPerformed on                                  Date: 05/26/2021Value: ACCU-CHEK     Status: Final              Comment: Notified RN or MDpH, Ven                                       Date: 05/26/2021Value: 7.484*      Ref range: 7.350 - 7.450      Status: Scherrie Gerlach                                     Date: 05/26/2021Value: 29.6*       Ref range: 40.0 - 50.0 mm Hg  Status: Sharlyn [place]                                      Date: 05/26/2021Value: 38          Ref range: Not Established *  Status: FinalHCO3, Venous                                  Date: 05/26/2021Value: 22.2*       Ref range: 23.0 - 29.0 mmol*  Status: FinalBase Excess, [employer]  Date: 05/26/2021Value: -1          Ref range: -3 - 3             Status: FinalO2 Sat, Ven                                   Date: 05/26/2021Value: 31          Ref range: Not Established %  Status: FinalTC02 (Calc), Ven                              Date: 05/26/2021Value: 23          Ref range: Not Established *  Status: FinalLactate                                       Date: 05/26/2021Value: 2.80*       Ref range: 0.40 - 2.00 mmol*  Status: FinalSample Type                                   Date: 05/26/2021Value: VEN           Status: FinalPerformed on                                  Date: 05/26/2021Value: SEE BELOW     Status: Final              Comment: Performed on POCSample Type: VenousLeft Ventricular Ejection Fraction            Date: 05/27/2021Value: 60            Status: Final-EditedLVEF MODALITY                                 Date: 05/27/2021Value: ECHO          Status: Final-EditedPOC Glucose                                   Date: 05/26/2021Value: 360*        Ref range: 60 - 115 mg/dl     Status: FinalPerformed on                                  Date:  05/26/2021Value: ACCU-CHEK     Status: Final              Comment: Notified RN or MDTroponin                                      Date: 05/26/2021Value: <0.010      Ref range: 0.000 - 0.010 ng*  Status: Final              Comment: Methodology by Troponin T.Troponin  Date: 05/27/2021Value: <0.010      Ref range: 0.000 - 0.010 ng*  Status: Final              Comment: Methodology by Troponin T.WBC                                           Date: 05/27/2021Value: 8.5         Ref range: 4.8 - 10.8 K/uL    Status: FinalRBC                                           Date: 05/27/2021Value: 5.36        Ref range: 4.70 - 6.10 M/uL   Status: FinalHemoglobin                                    Date: 05/27/2021Value: 14.6        Ref range: 14.0 - 18.0 g/dL   Status: FinalHematocrit                                    Date: 05/27/2021Value: 43.0        Ref range: 42.0 - 52.0 %      Status: FinalMCV                                           Date: 05/27/2021Value: 80.3        Ref range: 80.0 - 100.0 fL    Status: [place]                                           Date: 05/27/2021Value: 27.2        Ref range: 27.0 - 31.3 pg     Status: [place]                                          Date: 05/27/2021Value: 33.8        Ref range: 33.0 - 37.0 %      Status: FinalRDW                                           Date: 05/27/2021Value: 13.5        Ref range: 11.5 - 14.5 %      Status: FinalPlatelets                                     Date: 05/27/2021Value: 345         Ref range: 130 - 400 K/uL     Status: FinalSodium  Date: 05/27/2021Value: 136         Ref range: 135 - 144 mEq/L    Status: FinalPotassium reflex Magnesium                    Date: 05/27/2021Value: 3.6         Ref range: 3.4 - 4.9 mEq/L    Status: FinalChloride                                      Date: 05/27/2021Value: 102         Ref range: 95 - 107 mEq/L     Status: FinalCO2                                            Date: 05/27/2021Value: 24          Ref range: 20 - 31 mEq/L      Status: FinalAnion Gap                                     Date: 05/27/2021Value: 10          Ref range: 9 - 15 mEq/L       Status: FinalGlucose                                       Date: 05/27/2021Value: 223*        Ref range: 70 - 99 mg/dL      Status: FinalBUN                                           Date: 05/27/2021Value: 15          Ref range: 6 - 20 mg/dL       Status: FinalCREATININE                                    Date: 05/27/2021Value: 0.64*       Ref range: 0.70 - 1.20 mg/dL  Status: FinalGFR Non-African American                      Date: 05/27/2021Value: >60.0       Ref range: >60                Status: Final              Comment: >60 mL/min/1.5m EGFR, calc. for ages 131and older using theMDRD formula (not corrected for weight), is valid for stablerenal function.GFR African American                          Date: 05/27/2021Value: >60.0       Ref range: >60                Status: Final              Comment: >60 mL/min/1.721mEGFR, calc. for  ages 55 and older using theMDRD formula (not corrected for weight), is valid for stablerenal function.Calcium                                       Date: 05/27/2021Value: 8.7         Ref range: 8.5 - 9.9 mg/dL    Status: FinalHemoglobin A1C                                Date: 05/27/2021Value: 13.8*       Ref range: 4.8 - 5.9 %        Status: FinalPOC Glucose                                   Date: 05/26/2021Value: 264*        Ref range: 60 - 115 mg/dl     Status: FinalPerformed on                                  Date: 05/26/2021Value: ACCU-CHEK     Status: FinalVentricular Rate                              Date: 05/27/2021Value: 113         Ref range: BPM                Status: FinalAtrial Rate                                   Date: 05/27/2021Value: 113         Ref range: BPM                Status: FinalP-R Interval                                  Date: 05/27/2021Value: 156          Ref range: ms                 Status: FinalQRS Duration                                  Date: 05/27/2021Value: 72          Ref range: ms                 Status: FinalQ-T Interval                                  Date: 05/27/2021Value: 312         Ref range: ms                 Status: FinalQTc Calculation (Bazett)                      Date: 05/27/2021Value: 427         Ref range: ms  Status: FinalP Axis                                        Date: 05/27/2021Value: 39          Ref range: degrees            Status: FinalR Axis                                        Date: 05/27/2021Value: 33          Ref range: degrees            Status: FinalT Axis                                        Date: 05/27/2021Value: 28          Ref range: degrees            Status: FinalPOC Glucose                                   Date: 05/27/2021Value: 187*        Ref range: 60 - 115 mg/dl     Status: FinalPerformed on                                  Date: 05/27/2021Value: ACCU-CHEK     Status: [place] Glucose                                   Date: 05/27/2021Value: 183*        Ref range: 60 - 115 mg/dl     Status: FinalPerformed on                                  Date: 05/27/2021Value: ACCU-CHEK     Status: [place] Glucose                                   Date: 05/27/2021Value: 290*        Ref range: 60 - 115 mg/dl     Status: FinalPerformed on                                  Date: 05/27/2021Value: ACCU-CHEK     Status: [place]                                           Date: 05/27/2021Value: 1.480       Ref range: 0.440 - 3.860 uI*  Status: FinalProcalcitonin                                 Date: 05/27/2021Value:  0.15        Ref range: 0.00 - 0.15 ng/mL  Status: Final              Comment: Suspected Sepsis:Low likelihood of sepsis  <.50 ng/mLIncreased likelihood of sepsis 0.50-2.00 ng/mLAntibiotics encouragedHigh risk of sepsis/shock   >2.00 ng/mLAntibiotics strongly encouragedSuspected Lower Respiratory Tract  Infections:Low likelihood of bacterial infection  <0.24 ng/mLIncreased likelihood of bacterial infection >0.24 ng/mLAntibiotics encouragedWith successful antibiotic therapy, PCT levels should decreaserapidly. (Half-life of 24 to 36 hours.)Procalcitonin values from samples collected within the first6 hours of systemic infection may still be low.Retesting may be indicated.Values from day 1 and day 4 can be entered into the Change inProcalcitonin Calculator to determine the patient'sMortality Risk Prognosis([email or web address])In healthy neonates, plasma Procalcitonin (PCT) concentrationsincrease gradually after birth, reaching peak values at about24 hours of age then decrease to normal values below 0.5                        ng/mLby 48-72 hours of age.POC Glucose                                   Date: 05/27/2021Value: 202*        Ref range: 60 - 115 mg/dl     Status: FinalPerformed on                                  Date: 05/27/2021Value: ACCU-CHEK     Status: [place] Glucose                                   Date: 05/27/2021Value: 192*        Ref range: 60 - 115 mg/dl     Status: FinalPerformed on                                  Date: 05/27/2021Value: ACCU-CHEK     Status: FinalVentricular Rate                              Date: 05/26/2021Value: 110         Ref range: BPM                Status: FinalAtrial Rate                                   Date: 05/26/2021Value: 110         Ref range: BPM                Status: FinalP-R Interval                                  Date: 05/26/2021Value: 164         Ref range: ms                 Status: FinalQRS Duration  Date: 05/26/2021Value: 70          Ref range: ms                 Status: FinalQ-T Interval                                  Date: 05/26/2021Value: 318         Ref range: ms                 Status: FinalQTc Calculation (Bazett)                      Date: 05/26/2021Value: 430         Ref range: ms                 Status:  FinalP Axis                                        Date: 05/26/2021Value: 57          Ref range: degrees            Status: FinalR Axis                                        Date: 05/26/2021Value: 4          Ref range: degrees            Status: FinalT Axis                                        Date: 05/26/2021Value: 59          Ref range: degrees            Status: Final------------    Radiology last 7 days:  No results found.     '@[email or web address]' @    Discharge Medications    Discharge Medication List as of 02/15/2020 10:19 PM    Discharge Medication List as of 02/15/2020 10:19 PM    Discharge Medication List as of 02/15/2020 10:19 PMCONTINUE these medications which have NOT CHANGEDticagrelor (BRILINTA) 90 MG TABS tabletTake 90 mg by mouth 2 times dailyHistorical Medmetoprolol succinate (TOPROL XL) 100 MG extended release tabletTake 100 mg by mouth dailyHistorical Medisosorbide mononitrate (IMDUR) 60 MG extended release tabletTake 60 mg by mouth dailyHistorical Medaspirin 81 MG EC tabletTake 81 mg by mouth 2 times dailyHistorical Medinsulin glargine (LANTUS) 100 UNIT/ML injection vialInject 50 Units into the skin 2 times dailyHistorical Medinsulin lispro (HUMALOG) 100 UNIT/ML injection vialInject into the skin 3 times daily (before meals) Sliding scaleHistorical Med    Discharge Medication List as of 02/15/2020 10:19 PM    Time Spent on Discharge:E] minutes were spent in patient examination, evaluation, counseling as well as medication reconciliation, prescriptions for required medications, discharge plan, and follow up.    Electronically signed by Shon Hale, DO on 04/02/20 at 6:15 PM EDT

## 2020-02-15 NOTE — Progress Notes (Signed)
Hospitalist Progress Note      PCP: No primary care provider on file.    Date of Admission: 02/14/2020    Chief Complaint:      Subjective: :  Pt seen and examined. +pleuritic chest pain, has not done IS d/t pain. Worst in substernal area, nonremitting. Trops negative. Anxious to be DC, agreeable to see pulm        Medications:  Reviewed    Infusion Medications   ??? sodium chloride     ??? dextrose       Scheduled Medications   ??? pantoprazole  40 mg Oral QAM AC   ??? sodium chloride flush  5-40 mL Intravenous 2 times per day   ??? aspirin  81 mg Oral Daily   ??? atorvastatin  40 mg Oral Nightly   ??? nicotine  1 patch Transdermal Daily   ??? enoxaparin  40 mg Subcutaneous Daily   ??? insulin lispro  0-12 Units Subcutaneous TID WC   ??? insulin lispro  0-6 Units Subcutaneous Nightly   ??? isosorbide mononitrate  60 mg Oral Daily   ??? insulin glargine  50 Units Subcutaneous BID   ??? ticagrelor  90 mg Oral BID   ??? metoprolol succinate  100 mg Oral Daily     PRN Meds: sodium chloride flush, sodium chloride, promethazine **OR** ondansetron, acetaminophen **OR** acetaminophen, polyethylene glycol, potassium chloride **OR** potassium alternative oral replacement **OR** potassium chloride, magnesium sulfate, nitroGLYCERIN, glucose, dextrose, glucagon (rDNA), dextrose      Intake/Output Summary (Last 24 hours) at 02/15/2020 1228  Last data filed at 02/15/2020 0735  Gross per 24 hour   Intake 360 ml   Output 1300 ml   Net -940 ml       Exam:    BP 100/68    Pulse 88    Temp 98.1 ??F (36.7 ??C) (Oral)    Resp 18    Ht 6\' 1"  (1.854 m)    Wt 210 lb (95.3 kg)    SpO2 95%    BMI 27.71 kg/m??     GEN: Alert and oriented. NAD  HEENT: Normocephalic, atraumatic. PERL, Neck supple. Poor denition  Resp: CTA b/l no wheezing or rhonchi. No respiratory distress, Deep inhalation restricted d/t pain  Cardio: RRR. No murmur  Abd: Soft, nondistended. NTTP. BS present  Ext: No erythema or edema. LE NTTP   Skin is warm and dry        Labs:   Recent Labs     02/14/20  1615  02/15/20  0613   WBC 9.0 8.5   HGB 15.1 14.6   HCT 43.8 43.0   PLT 351 345     Recent Labs     02/14/20  1615 02/15/20  0613   NA 132* 136   K 4.2 3.6   CL 95 102   CO2 22 24   BUN 19 15   CREATININE 0.81 0.64*   CALCIUM 9.7 8.7     Recent Labs     02/14/20  1615   AST 27   ALT 43*   BILITOT 0.3   ALKPHOS 178*     No results for input(s): INR in the last 72 hours.  Recent Labs     02/14/20  1615 02/14/20  2000 02/15/20  0215   TROPONINI <0.010 <0.010 <0.010       Urinalysis:      No results found for: NITRU, [place], BACTERIA, RBCUA, BLOODU, [place], [place]    Radiology:  CTA Chest W WO  (PE study)   Final Result   1.  NO EVIDENCE OF CENTRAL OR PROXIMAL OR PROXIMAL SEGMENTAL PORT EMBOLI   2.  FOCAL AREA OF INFILTRATE CONSOLIDATION, PARTIAL COLLAPSE WITH AIR BRONCHOGRAMS OF THE LEFT LOWER LOBE LEFT LINGULAR REGION. UNDERLYING MALIGNANCY IS NOT EXCLUDED. WILL NEED FOLLOW-UP AND FURTHER EVALUATION.       All CT scans at this facility use dose modulation, iterative reconstruction, and/or weight based dosing when appropriate to reduce radiation dose to as low as reasonably achievable.            XR CHEST PORTABLE   Final Result      Patchy opacity at the left lung base, likely infectious in etiology.                    Assessment/Plan:    Active Hospital Problems    Diagnosis Date Noted   ??? Chest pain [R07.9] 02/14/2020        Chest pain  Unstable angina with h/o CAD and PCI  -Trops negative  -Cardiology consulted  - Continue home medications  - Consider other etiology-atelectasis, GERD as possible causes.     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  DVT: lovenox    Additional work up or/and treatment plan may be added today or then after based on clinical  progression. I am managing a portion of pt care. Some medical issues are handled by other specialists. Additional work up and treatment should be done in out pt setting by pt PCP and other out pt providers.       Dispo:Ok for discharge and follow up with his outpt providers once evaluated and cleared by pulm, possible tomorrow.  Repeat CXR today. Procalcitonin. Encourage IS.        Diet: DIET CARB CONTROL; No Caffeine    Code Status: Full Code              Electronically signed by Luiz Ochoa, DO on 02/15/2020 at 12:28 PM

## 2020-02-15 NOTE — Care Coordination-Inpatient (Signed)
[facility] Case Management Initial Discharge Assessment    Met with Patient to discuss discharge plan.        PCP: No primary care provider on file.                                Date of Last Visit: SAYS HE HAS A PRIMARY CARE PHYSICIAN IN [place] AND SAW HIM ONE MONTH AGO.    If no PCP, list provided? N/A    Discharge Planning    Living Arrangements: independently at home    Who do you live with? FIANCE    Who helps you with your care:  self    If lives at home:     Do you have any barriers navigating in your home? no    Patient can perform ADL?  Yes    Current Services (outpatient and in home) :  None    Dialysis: No    Is transportation available to get to your appointments? 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  FIANCE WILL DRIVE BACK    DME Equipment:  no    Respiratory equipment: None    Respiratory provider:  no     Pharmacy:  yes - [place].  WILL USE [facility] HERE.    Consult with Medication Assistance Program?  No      Patient agreeable to [facility]?      No    Patient agreeable to SNF/Rehab?     No    Other discharge needs identified?      N/A    Does Patient Have a High-Risk for Readmission Diagnosis (CHF, PN, MI, COPD)? Yes, see care coordinator assessment    If Yes,     Plan? (Note: please see concurrent daily documentation for any updates after initial note).    [place] DR[place] [facility].  TRUCK IS HERE AND FIANCE IS STAYING IN THE TRUCK.  PT HAS TO GET THE TRUCK BACK TO [place].  HE SAYS FIANCE WILL DRIVE MOST OF THE WAY.  PT DENIES ANY DC NEEDS AT THIS TIME.    Readmission Risk              Risk of Unplanned Readmission:  10         Electronically signed by Chandra Batch, LSW on 02/15/2020 at 3:38 PM

## 2020-02-15 NOTE — Progress Notes (Signed)
Progress Note  Patient: Evan Crawford  Unit/Bed: F573/U202-54  Date of Birth: 1981-03-13  MRN: [id number]  Acct: [id number]   Admitting Diagnosis: Chest pain [R07.9]  Admit Date:  02/14/2020  Hospital Day: 1    Chief Complaint: CP    Histories:  Past Medical History:   Diagnosis Date   ??? Diabetes mellitus (HCC)      No past surgical history on file.  No family history on file.  Social History     Socioeconomic History   ??? Marital status: Single     Spouse name: None   ??? Number of children: None   ??? Years of education: None   ??? Highest education level: None   Occupational History   ??? None   Tobacco Use   ??? Smoking status: None   Substance and Sexual Activity   ??? Alcohol use: None   ??? Drug use: None   ??? Sexual activity: None   Other Topics Concern   ??? None   Social History Narrative   ??? None     Social Determinants of Health     Financial Resource Strain:    ??? Difficulty of Paying Living Expenses:    Food Insecurity:    ??? Worried About Programme researcher, broadcasting/film/video in the Last Year:    ??? Barista in the Last Year:    Transportation Needs:    ??? Freight forwarder (Medical):    ??? Lack of Transportation (Non-Medical):    Physical Activity:    ??? Days of Exercise per Week:    ??? Minutes of Exercise per Session:    Stress:    ??? Feeling of Stress :    Social Connections:    ??? Frequency of Communication with Friends and Family:    ??? Frequency of Social Gatherings with Friends and Family:    ??? Attends Religious Services:    ??? Database administrator or Organizations:    ??? Attends Engineer, structural:    ??? Marital Status:    Intimate Programme researcher, broadcasting/film/video Violence:    ??? Fear of Current or Ex-Partner:    ??? Emotionally Abused:    ??? Physically Abused:    ??? Sexually Abused:        Subjective/HPI neck symptoms almost gone. Now has mid sternal reproducible pain. Breathing is fine.     EKG: SR 93        Review of Systems:   Review of Systems   Constitutional: Negative.  Negative for diaphoresis and fatigue.   HENT: Negative.    Eyes: Negative.     Respiratory: Negative.  Negative for cough, chest tightness, shortness of breath, wheezing and stridor.    Cardiovascular: Positive for chest pain. Negative for palpitations and leg swelling.   Gastrointestinal: Negative.  Negative for blood in stool and nausea.   Genitourinary: Negative.    Musculoskeletal: Negative.    Skin: Negative.    Neurological: Negative.  Negative for dizziness, syncope, weakness and light-headedness.   Hematological: Negative.    Psychiatric/Behavioral: Negative.          Physical Examination:    BP 100/68    Pulse 90    Temp 98.1 ??F (36.7 ??C) (Oral)    Resp 18    Ht 6\' 1"  (1.854 m)    Wt 210 lb (95.3 kg)    SpO2 95%    BMI 27.71 kg/m??    Physical Exam   Constitutional: He appears healthy. No  distress.   HENT:   Normal cephalic and Atraumatic   Eyes: Pupils are equal, round, and reactive to light.   Neck: Thyroid normal. No JVD present. No neck adenopathy. No thyromegaly present.   Cardiovascular: Normal rate, regular rhythm, normal heart sounds, intact distal pulses and normal pulses.   Pulmonary/Chest: Effort normal and breath sounds normal. He has no wheezes. He has no rales. He exhibits no tenderness.   Abdominal: Soft. Bowel sounds are normal. There is no abdominal tenderness.   Musculoskeletal:         General: No tenderness or edema. Normal range of motion.      Cervical back: Normal range of motion and neck supple.   Neurological: He is alert and oriented to person, place, and time.   Skin: Skin is warm. No cyanosis. Nails show no clubbing.       LABS:  CBC:   Lab Results   Component Value Date    WBC 8.5 02/15/2020    RBC 5.36 02/15/2020    HGB 14.6 02/15/2020    HCT 43.0 02/15/2020    MCV 80.3 02/15/2020    MCH 27.2 02/15/2020    MCHC 33.8 02/15/2020    RDW 13.5 02/15/2020    PLT 345 02/15/2020     CBC with Differential:    Lab Results   Component Value Date    WBC 8.5 02/15/2020    RBC 5.36 02/15/2020    HGB 14.6 02/15/2020    HCT 43.0 02/15/2020    PLT 345 02/15/2020    MCV  80.3 02/15/2020    MCH 27.2 02/15/2020    MCHC 33.8 02/15/2020    RDW 13.5 02/15/2020    LYMPHOPCT 19.7 02/14/2020    MONOPCT 9.5 02/14/2020    BASOPCT 0.5 02/14/2020    MONOSABS 0.9 02/14/2020    LYMPHSABS 1.8 02/14/2020    EOSABS 0.1 02/14/2020    BASOSABS 0.0 02/14/2020     CMP:    Lab Results   Component Value Date    NA 136 02/15/2020    K 3.6 02/15/2020    CL 102 02/15/2020    CO2 24 02/15/2020    BUN 15 02/15/2020    CREATININE 0.64 02/15/2020    GFRAA >60.0 02/15/2020    LABGLOM >60.0 02/15/2020    GLUCOSE 223 02/15/2020    PROT 7.3 02/14/2020    LABALBU 4.0 02/14/2020    CALCIUM 8.7 02/15/2020    BILITOT 0.3 02/14/2020    ALKPHOS 178 02/14/2020    AST 27 02/14/2020    ALT 43 02/14/2020     BMP:    Lab Results   Component Value Date    NA 136 02/15/2020    K 3.6 02/15/2020    CL 102 02/15/2020    CO2 24 02/15/2020    BUN 15 02/15/2020    LABALBU 4.0 02/14/2020    CREATININE 0.64 02/15/2020    CALCIUM 8.7 02/15/2020    GFRAA >60.0 02/15/2020    LABGLOM >60.0 02/15/2020    GLUCOSE 223 02/15/2020     Magnesium:    Lab Results   Component Value Date    MG 1.8 02/14/2020     Troponin:    Lab Results   Component Value Date    TROPONINI <0.010 02/15/2020        Active Hospital Problems    Diagnosis Date Noted   ??? Chest pain [R07.9] 02/14/2020     Priority: Low  Assessment/Plan:  1. Partial Left Lung collapse. Possible Underlying malignancy noted - Pulmonary to evaluate.   2. CP - Trops and ECG all negative.  continue CV meds.  He will f/u with [facility] Cardiologist at [facility]  3. Echo -P  4. HTN Stable  5. CAD- DAPTBB Statin   6. Dr. Tasia Catchings to assume care       Electronically signed by Harl Favor, MD on 02/15/2020 at 7:51 AM

## 2020-02-15 NOTE — Consults (Signed)
Pulmonary and Critical Care Medicine  Consult Note  Encounter Date: 02/15/2020 2:26 PM    Mr. Evan Crawford is a 39 y.o. male  DOB: 1981/07/17  Requesting Provider: Luiz Ochoa, DO    Reason for request: Chest pain/abnormal CT            HISTORY OF PRESENT ILLNESS:    Patient is 39 y.o. presents with left-sided chest pain, started suddenly, worse with breathing or coughing, he has mild cough, nonproductive, shortness of breath mainly when he coughs, no fever or chills, no nausea no vomiting, he never smoked, he works as a Naval architect, no car accidents, no prior lung surgeries, no history of lung disease.        Past Medical History:        Diagnosis Date   ??? Diabetes mellitus (HCC)        Past Surgical History:    No past surgical history on file.    Social History:         Family History:   No family history on file.  No family history of lung disease  Allergies:  Patient has no known allergies.        MEDICATIONS during current hospitalization:    Continuous Infusions:  ??? sodium chloride     ??? dextrose         Scheduled Meds:  ??? pantoprazole  40 mg Oral QAM AC   ??? sodium chloride flush  5-40 mL Intravenous 2 times per day   ??? aspirin  81 mg Oral Daily   ??? atorvastatin  40 mg Oral Nightly   ??? nicotine  1 patch Transdermal Daily   ??? enoxaparin  40 mg Subcutaneous Daily   ??? insulin lispro  0-12 Units Subcutaneous TID WC   ??? insulin lispro  0-6 Units Subcutaneous Nightly   ??? isosorbide mononitrate  60 mg Oral Daily   ??? insulin glargine  50 Units Subcutaneous BID   ??? ticagrelor  90 mg Oral BID   ??? metoprolol succinate  100 mg Oral Daily       PRN Meds:sodium chloride flush, sodium chloride, promethazine **OR** ondansetron, acetaminophen **OR** acetaminophen, polyethylene glycol, potassium chloride **OR** potassium alternative oral replacement **OR** potassium chloride, magnesium sulfate, nitroGLYCERIN, glucose, dextrose, glucagon (rDNA), dextrose        REVIEW OF SYSTEMS:  ROS: 10 organs review of system is done including  general, psychological, ENT, hematological, endocrine, respiratory, cardiovascular, gastrointestinal, musculoskeletal, neurological,  allergy and Immunology is done and is otherwise negative.    PHYSICAL EXAM:    Vitals:  BP 100/68    Pulse 88    Temp 98.1 ??F (36.7 ??C) (Oral)    Resp 18    Ht 6\' 1"  (1.854 m)    Wt 210 lb (95.3 kg)    SpO2 95%    BMI 27.71 kg/m??     General: alert, cooperative, no distress  Head: normocephalic, atraumatic  Eyes:No gross abnormalities.  ENT:  MMM no lesions  Neck:  supple and no masses  Chest : Few rales at the left base, otherwise clear no wheezing, nontender, tympanic  Heart:: Heart sounds are normal.  Regular rate and rhythm without murmur, gallop or rub.  ABD:  symmetric, soft, non-tender, no guarding or rebound  Musculoskeletal : no cyanosis, no clubbing and no edema  Neuro:  Grossly normal  Skin: No rashes or nodules noted.  Lymph node:  no cervical nodes  Urology: No Foley   Psychiatric: appropriate  Data Review  Recent Labs     02/14/20  1615 02/15/20  0613   WBC 9.0 8.5   HGB 15.1 14.6   HCT 43.8 43.0   PLT 351 345      Recent Labs     02/14/20  1615 02/15/20  0613   NA 132* 136   K 4.2 3.6   CL 95 102   CO2 22 24   BUN 19 15   CREATININE 0.81 0.64*   GLUCOSE 559* 223*       MV Settings:          ABGs: No results for input(s): PHART, PCO2ART, PO2ART, HCO3ART, BEART, O2SATART, TCO2ART in the last 72 hours.  O2 Device: None (Room air)  No results found for: [place]    Radiology  I personally reviewed imaging studies and dense infiltrate left lower lobe        Assessment, plan:   Patient is at risk due to    ?? Left lower lobe pneumonia, need to rule out Legionella pneumonia  ?? Rule out underlying mass needs follow-up CAT scan  ?? Pleuritic chest pain secondary to #1      Recommendation  ?? Levofloxacin 750 mg IV daily, will transition to p.o. as patient improved  ?? Urine Legionella antigen  ?? Sputum Gram stain and culture if able to produce  ?? Incentive spirometry  ?? Patient  will need follow-up CT chest in 6 weeks, he lives in [place] 4 hours away from here but he is willing to come back for the follow-up.          Thank you for consultation    Electronically signed by Koren Shiver, MD, FCCP,  on 02/15/2020 at 2:26 PM

## 2020-02-15 NOTE — Plan of Care (Signed)
Ongoing

## 2020-02-16 LAB — EKG 12-LEAD
Atrial Rate: 110 {beats}/min
P Axis: 57 degrees
P-R Interval: 164 ms
Q-T Interval: 318 ms
QRS Duration: 70 ms
QTc Calculation (Bazett): 430 ms
R Axis: 45 degrees
T Axis: 59 degrees
Ventricular Rate: 110 {beats}/min

## 2020-02-16 LAB — POCT GLUCOSE: POC Glucose: 192 mg/dl — ABNORMAL HIGH (ref 60–115)

## 2021-04-29 ENCOUNTER — Inpatient Hospital Stay
Admit: 2021-04-29 | Discharge: 2021-04-30 | Disposition: A | Discharge status: Home / Self Care | Payer: MEDICARE | Attending: Emergency Medicine

## 2021-04-29 DIAGNOSIS — N411 Chronic prostatitis: Secondary | ICD-10-CM

## 2021-04-29 LAB — CBC WITH AUTOMATED DIFF
ABS. BASOPHILS: 0 10*3/uL (ref 0.0–0.2)
ABS. EOSINOPHILS: 0.1 10*3/uL (ref 0.0–0.7)
ABS. LYMPHOCYTES: 1.8 10*3/uL (ref 1.0–4.8)
ABS. MONOCYTES: 0.5 10*3/uL (ref 0.2–2.4)
ABS. NEUTROPHILS: 4.4 10*3/uL (ref 1.8–7.7)
ABSOLUTE NRBC: 0.01 10*3/uL
BASOPHILS: 1 % (ref 0.0–2.5)
EOSINOPHILS: 2 % (ref 0.9–2.9)
HCT: 42.4 % (ref 41–53)
HGB: 13.8 g/dL (ref 13.5–17.5)
LYMPHOCYTES: 26 % (ref 20.5–51.1)
MCH: 24.7 PG — ABNORMAL LOW (ref 31–34)
MCHC: 32.6 g/dL (ref 31.0–36.0)
MCV: 75.7 FL — ABNORMAL LOW (ref 80–100)
MONOCYTES: 7 % (ref 1.7–9.3)
MPV: 8.8 FL (ref 6.5–11.5)
NEUTROPHILS: 64 % (ref 42–75)
NRBC: 0.1 PER 100 WBC
PLATELET: 261 10*3/uL (ref 150–400)
RBC: 5.6 M/uL (ref 4.50–5.90)
RDW: 14.8 % — ABNORMAL HIGH (ref 11.5–14.5)
WBC: 6.9 10*3/uL (ref 4.4–11.3)

## 2021-04-29 LAB — CBC WITH AUTO DIFFERENTIAL
Basophils %: 1 % (ref 0.0–2.5)
Basophils Absolute: 0 10*3/uL (ref 0.0–0.2)
Eosinophils %: 2 % (ref 0.9–2.9)
Eosinophils Absolute: 0.1 10*3/uL (ref 0.0–0.7)
Hematocrit: 42.4 % (ref 41–53)
Hemoglobin: 13.8 g/dL (ref 13.5–17.5)
Lymphocytes %: 26 % (ref 20.5–51.1)
Lymphocytes Absolute: 1.8 10*3/uL (ref 1.0–4.8)
MCH: 24.7 PG — ABNORMAL LOW (ref 31–34)
MCHC: 32.6 g/dL (ref 31.0–36.0)
MCV: 75.7 FL — ABNORMAL LOW (ref 80–100)
MPV: 8.8 FL (ref 6.5–11.5)
Monocytes %: 7 % (ref 1.7–9.3)
Monocytes Absolute: 0.5 10*3/uL (ref 0.2–2.4)
NRBC Absolute: 0.01 10*3/uL
Neutrophils %: 64 % (ref 42–75)
Neutrophils Absolute: 4.4 10*3/uL (ref 1.8–7.7)
Nucleated RBCs: 0.1 PER 100 WBC
Platelets: 261 10*3/uL (ref 150–400)
RBC: 5.6 M/uL (ref 4.50–5.90)
RDW: 14.8 % — ABNORMAL HIGH (ref 11.5–14.5)
WBC: 6.9 10*3/uL (ref 4.4–11.3)

## 2021-04-29 MED ORDER — HYDROMORPHONE 1 MG/ML INJECTION SOLUTION
1 mg/mL | Freq: Once | INTRAMUSCULAR | Status: AC
Start: 2021-04-29 — End: 2021-04-29
  Administered 2021-04-29: via INTRAVENOUS

## 2021-04-29 MED ORDER — SODIUM CHLORIDE 0.9 % IV PIGGY BACK
3.375 gram | INTRAVENOUS | Status: AC
Start: 2021-04-29 — End: 2021-04-29
  Administered 2021-04-29: via INTRAVENOUS

## 2021-04-29 MED ORDER — KETOROLAC TROMETHAMINE 30 MG/ML INJECTION
30 mg/mL (1 mL) | Freq: Once | INTRAMUSCULAR | Status: AC
Start: 2021-04-29 — End: 2021-04-29
  Administered 2021-04-29: via INTRAVENOUS

## 2021-04-29 MED ORDER — SODIUM CHLORIDE 0.9 % IV
Freq: Once | INTRAVENOUS | Status: AC
Start: 2021-04-29 — End: 2021-04-29
  Administered 2021-04-29: via INTRAVENOUS

## 2021-04-29 MED FILL — KETOROLAC TROMETHAMINE 30 MG/ML INJECTION: 30 mg/mL (1 mL) | INTRAMUSCULAR | Qty: 1

## 2021-04-29 MED FILL — SODIUM CHLORIDE 0.9 % IV: INTRAVENOUS | Qty: 1000

## 2021-04-29 MED FILL — HYDROMORPHONE 1 MG/ML INJECTION SOLUTION: 1 mg/mL | INTRAMUSCULAR | Qty: 1

## 2021-04-29 MED FILL — PIPERACILLIN-TAZOBACTAM 3.375 GRAM IV SOLR: 3.375 gram | INTRAVENOUS | Qty: 3.38

## 2021-04-29 NOTE — ED Provider Notes (Signed)
HPI 40 year old male with a complicated urologic history beginning in January 2022 with prostatitis underwent a TURP in February 0350 complicated by urinary tract infection requiring hospitalization IV antibiotics treated with Zosyn.  Had a repeat TURP performed in June 2022.  Recurring episodes of prostatitis over the last several months has been treated with Cipro and Levaquin currently taking Bactrim.  Over the past 3 to 4 days increasing pain in the prostate area with hematuria no fever or chills.  No relief with Bactrim currently.  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  Complains of pain in the prostate area denies testicular or penile pain able to pass urine no bloody mixed with small clots.  On Brilinta and aspirin for cardiac stents.  No chest pain or shortness of breath      No past medical history on file.    No past surgical history on file.      No family history on file.    Social History     Socioeconomic History    Marital status: DIVORCED     Spouse name: Not on file    Number of children: Not on file    Years of education: Not on file    Highest education level: Not on file   Occupational History    Not on file   Tobacco Use    Smoking status: Not on file    Smokeless tobacco: Not on file   Substance and Sexual Activity    Alcohol use: Not on file    Drug use: Not on file    Sexual activity: Not on file   Other Topics Concern    Not on file   Social History Narrative    Not on file     Social Determinants of Health     Financial Resource Strain: Not on file   Food Insecurity: Not on file   Transportation Needs: Not on file   Physical Activity: Not on file   Stress: Not on file   Social Connections: Not on file   Intimate Partner Violence: Not on file   Housing Stability: Not on file         ALLERGIES: Penicillins    Review of Systems   Constitutional:  Negative for appetite change, chills, diaphoresis and fever.   HENT:  Negative for ear pain,  facial swelling, sinus pain and trouble swallowing.    Eyes:  Negative for redness and visual disturbance.   Respiratory:  Negative for cough, choking, chest tightness, shortness of breath, wheezing and stridor.    Cardiovascular:  Negative for chest pain, palpitations and leg swelling.   Gastrointestinal:  Negative for abdominal distention, abdominal pain, blood in stool, diarrhea, nausea and vomiting.   Endocrine: Negative for polydipsia and polyuria.   Genitourinary:  Positive for difficulty urinating, dysuria, frequency and hematuria. Negative for flank pain, genital sores, penile discharge, penile pain, penile swelling, scrotal swelling, testicular pain and urgency.   Musculoskeletal: Negative.    Allergic/Immunologic: Negative.    Neurological: Negative.    Psychiatric/Behavioral: Negative.       Vitals:    04/29/21 1851   BP: (!) 121/91   Pulse: (!) 116   Resp: (!) 32   Temp: 97.8 ??F (36.6 ??C)   SpO2: 100%   Weight: 99.8 kg (220 lb)   Height: 6' 1" (1.854 m)          White male very uncomfortable tachypneic  tachycardic per his assessment due to pain  Physical Exam  Vitals and nursing note reviewed.   Constitutional:       General: He is not in acute distress.     Appearance: Normal appearance. He is normal weight. He is not ill-appearing, toxic-appearing or diaphoretic.   HENT:      Head: Normocephalic and atraumatic.      Left Ear: Tympanic membrane normal.      Nose: Nose normal.      Mouth/Throat:      Mouth: Mucous membranes are moist.   Eyes:      Extraocular Movements: Extraocular movements intact.      Conjunctiva/sclera: Conjunctivae normal.   Cardiovascular:      Rate and Rhythm: Normal rate and regular rhythm.      Pulses: Normal pulses.      Heart sounds: Normal heart sounds. No murmur heard.  Pulmonary:      Effort: Pulmonary effort is normal. No respiratory distress.      Breath sounds: Normal breath sounds. No wheezing, rhonchi or rales.   Abdominal:      General: Bowel sounds are normal. There  is no distension.      Palpations: Abdomen is soft. There is no mass.      Tenderness: There is no abdominal tenderness. There is no right CVA tenderness, left CVA tenderness, guarding or rebound.      Hernia: No hernia is present.   Genitourinary:     Penis: Normal.       Testes: Normal.      Comments: Deferred prostatic exam due to ongoing infection  Musculoskeletal:         General: No swelling, tenderness, deformity or signs of injury. Normal range of motion.      Cervical back: Normal range of motion and neck supple. No rigidity or tenderness.      Right lower leg: No edema.      Left lower leg: No edema.   Lymphadenopathy:      Cervical: No cervical adenopathy.   Skin:     General: Skin is warm and dry.      Capillary Refill: Capillary refill takes less than 2 seconds.      Findings: No erythema, lesion or rash.   Neurological:      General: No focal deficit present.      Mental Status: He is alert and oriented to person, place, and time. Mental status is at baseline.      Cranial Nerves: No cranial nerve deficit.      Sensory: No sensory deficit.   Psychiatric:         Mood and Affect: Mood normal.         Behavior: Behavior normal.         Thought Content: Thought content normal.        MDM     Amount and/or Complexity of Data Reviewed  Clinical lab tests: reviewed  Tests in the radiology section of CPT??: reviewed    Risk of Complications, Morbidity, and/or Mortality  Presenting problems: moderate  Diagnostic procedures: moderate  Management options: high  General comments: Patient care signed out at shift change.  Labs are still pending.  I met face-to-face with the patient.  He has no difficulty urinating.  He has been on Bactrim for 2 weeks.  UA is negative except for blood.  WBC is normal.  Will switch antibiotics to Levaquin.    Patient Progress  Patient progress: stable  40 year old male with complicated urologic history of prostatitis TURP x2 over the last 7 months.  Currently on Bactrim and  ineffective.  We will check standard labs and cultures       Procedures

## 2021-04-29 NOTE — ED Notes (Signed)
Discharged home to self.  Ambulatory out of ED.  VS WDL.  0 s/s acute distress.  Respirations even and unlabored.  Discharge instructions and follow up care reviewed.  Patient receptive and demonstrated knowledge of instruction via teach-back method.

## 2021-04-29 NOTE — ED Notes (Signed)
Pt reports dark blood in urine since 02/22/2021 after TURP. Pt rates pain at 10/10. Pt is on brilinta.

## 2021-04-29 NOTE — ED Notes (Signed)
Patient still complains of pain.  Dr. Nada Maclachlan notified.

## 2021-04-30 LAB — METABOLIC PANEL, BASIC
Anion gap: 9 mmol/L (ref 5–15)
BUN/Creatinine ratio: 22 — ABNORMAL HIGH (ref 12–20)
BUN: 23 mg/dL — ABNORMAL HIGH (ref 6–20)
CO2: 26 mmol/L (ref 21–32)
Calcium: 9.4 mg/dL (ref 8.5–10.1)
Chloride: 94 mmol/L — ABNORMAL LOW (ref 97–108)
Creatinine: 1.05 mg/dL (ref 0.70–1.30)
GFR est AA: >60 mL/min/{1.73_m2} (ref >60)
GFR est non-AA: >60 mL/min/{1.73_m2} (ref >60)
Glucose: 508 mg/dL — ABNORMAL HIGH (ref 65–100)
Potassium: 4 mmol/L (ref 3.5–5.1)
Sodium: 129 mmol/L — ABNORMAL LOW (ref 136–145)

## 2021-04-30 LAB — GLUCOSE, POC: Glucose (POC): 324 mg/dL — ABNORMAL HIGH (ref 65–117)

## 2021-04-30 LAB — LACTIC ACID
Lactic Acid: 1.2 mmol/L (ref 0.4–2.0)
Lactic acid: 1.2 mmol/L (ref 0.4–2.0)

## 2021-04-30 LAB — URINALYSIS W/ RFLX MICROSCOPIC
Bilirubin, Urine: NEGATIVE
Bilirubin: NEGATIVE
Glucose, Ur: >1000 mg/dL — AB
Glucose: >1000 mg/dL — AB
Ketone: 15 mg/dL — AB
Ketones, Urine: 15 mg/dL — AB
Leukocyte Esterase, Urine: NEGATIVE
Leukocyte Esterase: NEGATIVE
Nitrite, Urine: NEGATIVE
Nitrites: NEGATIVE
Protein, UA: NEGATIVE mg/dL
Protein: NEGATIVE mg/dL
Specific Gravity, UA: 1.01 (ref 1.003–1.030)
Specific gravity: 1.01 (ref 1.003–1.030)
Urobilinogen, UA, POCT: 0.2 EU/dL (ref 0.2–1.0)
Urobilinogen: 0.2 EU/dL (ref 0.2–1.0)
pH (UA): 7 (ref 5.0–8.0)
pH, UA: 7 (ref 5.0–8.0)

## 2021-04-30 LAB — URINE MICROSCOPIC
BACTERIA, URINE: NEGATIVE /hpf
Bacteria: NEGATIVE /hpf

## 2021-04-30 LAB — BASIC METABOLIC PANEL
Anion Gap: 9 mmol/L (ref 5–15)
BUN: 23 mg/dL — ABNORMAL HIGH (ref 6–20)
Bun/Cre Ratio: 22 — ABNORMAL HIGH (ref 12–20)
CO2: 26 mmol/L (ref 21–32)
Calcium: 9.4 mg/dL (ref 8.5–10.1)
Chloride: 94 mmol/L — ABNORMAL LOW (ref 97–108)
Creatinine: 1.05 mg/dL (ref 0.70–1.30)
EGFR IF NonAfrican American: >60 mL/min/{1.73_m2} (ref >60)
GFR African American: >60 mL/min/{1.73_m2} (ref >60)
Glucose: 508 mg/dL — ABNORMAL HIGH (ref 65–100)
Potassium: 4 mmol/L (ref 3.5–5.1)
Sodium: 129 mmol/L — ABNORMAL LOW (ref 136–145)

## 2021-04-30 LAB — POCT GLUCOSE: POC Glucose: 324 mg/dL — ABNORMAL HIGH (ref 65–117)

## 2021-04-30 MED ORDER — INSULIN REGULAR HUMAN 100 UNIT/ML INJECTION
100 unit/mL | INTRAMUSCULAR | Status: AC
Start: 2021-04-30 — End: 2021-04-29
  Administered 2021-04-30: 02:00:00 via INTRAVENOUS

## 2021-04-30 MED ORDER — LEVOFLOXACIN 750 MG TAB
750 mg | ORAL_TABLET | Freq: Every day | ORAL | 0 refills | Status: AC
Start: 2021-04-30 — End: 2021-05-13

## 2021-04-30 MED ORDER — HYDROMORPHONE 1 MG/ML INJECTION SOLUTION
1 mg/mL | Freq: Once | INTRAMUSCULAR | Status: AC
Start: 2021-04-30 — End: 2021-04-29
  Administered 2021-04-30: 02:00:00 via INTRAVENOUS

## 2021-04-30 MED FILL — HYDROMORPHONE 1 MG/ML INJECTION SOLUTION: 1 mg/mL | INTRAMUSCULAR | Qty: 1

## 2021-04-30 MED FILL — HUMULIN R REGULAR U-100 INSULIN 100 UNIT/ML INJECTION SOLUTION: 100 unit/mL | INTRAMUSCULAR | Qty: 10

## 2021-05-01 LAB — CULTURE, URINE
Culture result:: NO GROWTH
Culture: NO GROWTH

## 2021-05-05 LAB — CULTURE, BLOOD
Culture result:: NO GROWTH
Culture result:: NO GROWTH

## 2021-05-05 LAB — CULTURE, BLOOD 1
Culture: NO GROWTH
Culture: NO GROWTH

## 2021-09-04 ENCOUNTER — Emergency Department: Payer: Medicare Other

## 2021-09-04 ENCOUNTER — Inpatient Hospital Stay
Admission: EM | Admit: 2021-09-04 | Discharge: 2021-09-11 | DRG: 623 | Disposition: A | Discharge status: Home Health | Payer: Medicare Other | Attending: Internal Medicine | Admitting: Internal Medicine

## 2021-09-04 DIAGNOSIS — Z8673 Personal history of transient ischemic attack (TIA), and cerebral infarction without residual deficits: Secondary | ICD-10-CM

## 2021-09-04 DIAGNOSIS — E1165 Type 2 diabetes mellitus with hyperglycemia: Secondary | ICD-10-CM

## 2021-09-04 DIAGNOSIS — I10 Essential (primary) hypertension: Secondary | ICD-10-CM | POA: Diagnosis present

## 2021-09-04 DIAGNOSIS — M869 Osteomyelitis, unspecified: Secondary | ICD-10-CM | POA: Diagnosis present

## 2021-09-04 DIAGNOSIS — Z9049 Acquired absence of other specified parts of digestive tract: Secondary | ICD-10-CM

## 2021-09-04 DIAGNOSIS — Z79899 Other long term (current) drug therapy: Secondary | ICD-10-CM

## 2021-09-04 DIAGNOSIS — R7889 Finding of other specified substances, not normally found in blood: Secondary | ICD-10-CM

## 2021-09-04 DIAGNOSIS — L97429 Non-pressure chronic ulcer of left heel and midfoot with unspecified severity: Secondary | ICD-10-CM | POA: Diagnosis present

## 2021-09-04 DIAGNOSIS — E872 Acidosis, unspecified: Secondary | ICD-10-CM

## 2021-09-04 DIAGNOSIS — R339 Retention of urine, unspecified: Secondary | ICD-10-CM | POA: Diagnosis present

## 2021-09-04 DIAGNOSIS — E1069 Type 1 diabetes mellitus with other specified complication: Principal | ICD-10-CM | POA: Diagnosis present

## 2021-09-04 DIAGNOSIS — E10628 Type 1 diabetes mellitus with other skin complications: Secondary | ICD-10-CM | POA: Diagnosis present

## 2021-09-04 DIAGNOSIS — I2510 Atherosclerotic heart disease of native coronary artery without angina pectoris: Secondary | ICD-10-CM | POA: Diagnosis present

## 2021-09-04 DIAGNOSIS — L97529 Non-pressure chronic ulcer of other part of left foot with unspecified severity: Secondary | ICD-10-CM | POA: Diagnosis present

## 2021-09-04 DIAGNOSIS — E11621 Type 2 diabetes mellitus with foot ulcer: Secondary | ICD-10-CM

## 2021-09-04 DIAGNOSIS — N419 Inflammatory disease of prostate, unspecified: Secondary | ICD-10-CM | POA: Diagnosis present

## 2021-09-04 DIAGNOSIS — M009 Pyogenic arthritis, unspecified: Secondary | ICD-10-CM | POA: Diagnosis present

## 2021-09-04 DIAGNOSIS — Z955 Presence of coronary angioplasty implant and graft: Secondary | ICD-10-CM

## 2021-09-04 DIAGNOSIS — E10621 Type 1 diabetes mellitus with foot ulcer: Secondary | ICD-10-CM | POA: Diagnosis present

## 2021-09-04 DIAGNOSIS — R300 Dysuria: Secondary | ICD-10-CM | POA: Diagnosis present

## 2021-09-04 DIAGNOSIS — E10622 Type 1 diabetes mellitus with other skin ulcer: Secondary | ICD-10-CM | POA: Diagnosis present

## 2021-09-04 DIAGNOSIS — B9562 Methicillin resistant Staphylococcus aureus infection as the cause of diseases classified elsewhere: Secondary | ICD-10-CM | POA: Diagnosis present

## 2021-09-04 DIAGNOSIS — K6289 Other specified diseases of anus and rectum: Secondary | ICD-10-CM

## 2021-09-04 DIAGNOSIS — N400 Enlarged prostate without lower urinary tract symptoms: Secondary | ICD-10-CM | POA: Diagnosis present

## 2021-09-04 DIAGNOSIS — R7982 Elevated C-reactive protein (CRP): Secondary | ICD-10-CM

## 2021-09-04 DIAGNOSIS — R000 Tachycardia, unspecified: Secondary | ICD-10-CM

## 2021-09-04 DIAGNOSIS — E1010 Type 1 diabetes mellitus with ketoacidosis without coma: Secondary | ICD-10-CM | POA: Diagnosis present

## 2021-09-04 DIAGNOSIS — M19072 Primary osteoarthritis, left ankle and foot: Secondary | ICD-10-CM | POA: Diagnosis present

## 2021-09-04 DIAGNOSIS — E876 Hypokalemia: Secondary | ICD-10-CM | POA: Diagnosis present

## 2021-09-04 DIAGNOSIS — Z7901 Long term (current) use of anticoagulants: Secondary | ICD-10-CM

## 2021-09-04 DIAGNOSIS — R700 Elevated erythrocyte sedimentation rate: Secondary | ICD-10-CM

## 2021-09-04 DIAGNOSIS — L03116 Cellulitis of left lower limb: Secondary | ICD-10-CM | POA: Diagnosis present

## 2021-09-04 DIAGNOSIS — R1030 Lower abdominal pain, unspecified: Secondary | ICD-10-CM

## 2021-09-04 DIAGNOSIS — R6510 Systemic inflammatory response syndrome (SIRS) of non-infectious origin without acute organ dysfunction: Secondary | ICD-10-CM

## 2021-09-04 DIAGNOSIS — E1040 Type 1 diabetes mellitus with diabetic neuropathy, unspecified: Secondary | ICD-10-CM | POA: Diagnosis present

## 2021-09-04 HISTORY — DX: Unstable angina: I20.0

## 2021-09-04 HISTORY — DX: Essential (primary) hypertension: I10

## 2021-09-04 HISTORY — DX: Cerebral infarction, unspecified: I63.9

## 2021-09-04 HISTORY — DX: Type 1 diabetes mellitus without complications: E10.9

## 2021-09-04 HISTORY — DX: Acute kidney failure, unspecified: N17.9

## 2021-09-04 HISTORY — DX: Atherosclerotic heart disease of native coronary artery without angina pectoris: I25.10

## 2021-09-04 HISTORY — DX: Other specified postprocedural states: Z98.890

## 2021-09-04 LAB — I-STAT LACTIC ACID
Lactic Acid I-Stat: 1.9 mMol/L (ref 0.5–1.9)
Lactic Acid I-Stat: 2.5 mMol/L (ref 0.5–1.9)
Room Number I-Stat: 11
Room Number I-Stat: 11

## 2021-09-04 LAB — VH URINALYSIS WITH MICROSCOPIC AND CULTURE IF INDICATED
Bilirubin, UA: NEGATIVE mg/dL
Glucose, UA: >=1000 mg/dL — AB
Leukocyte Esterase, UA: NEGATIVE Leu/uL
Nitrite, UA: NEGATIVE
RBC, UA: 6 /hpf — ABNORMAL HIGH (ref 0–4)
Squam Epithel, UA: <1 /hpf (ref 0–2)
Urine Specific Gravity: 1.01 (ref 1.005–1.030)
Urobilinogen, UA: 0.2 mg/dL
WBC, UA: 4 /hpf (ref 0–4)
pH, Urine: 6 pH (ref 5.0–8.0)

## 2021-09-04 LAB — ECG 12-LEAD
P Wave Axis: 79 deg
P-R Interval: 168 ms
Patient Age: 40 years
Q-T Interval(Corrected): 420 ms
Q-T Interval: 297 ms
QRS Axis: 48 deg
QRS Duration: 72 ms
Ventricular Rate: 120 //min

## 2021-09-04 LAB — CBC AND DIFFERENTIAL
Basophils %: 0.4 % (ref 0.0–3.0)
Basophils Absolute: 0.1 10*3/uL (ref 0.0–0.3)
Eosinophils %: 2.4 % (ref 0.0–7.0)
Eosinophils Absolute: 0.3 10*3/uL (ref 0.0–0.8)
Hematocrit: 33.1 % — ABNORMAL LOW (ref 39.0–52.5)
Hemoglobin: 11 gm/dL — ABNORMAL LOW (ref 13.0–17.5)
Lymphocytes Absolute: 1.5 10*3/uL (ref 0.6–5.1)
Lymphocytes: 12.7 % — ABNORMAL LOW (ref 15.0–46.0)
MCH: 28 pg (ref 28–35)
MCHC: 33 gm/dL (ref 32–36)
MCV: 83 fL (ref 80–100)
MPV: 9.1 fL (ref 6.0–10.0)
Monocytes Absolute: 1 10*3/uL (ref 0.1–1.7)
Monocytes: 8.8 % (ref 3.0–15.0)
Neutrophils %: 75.7 % (ref 42.0–78.0)
Neutrophils Absolute: 8.7 10*3/uL — ABNORMAL HIGH (ref 1.7–8.6)
PLT CT: 259 10*3/uL (ref 130–440)
RBC: 4.01 10*6/uL (ref 4.00–5.70)
RDW: 16.4 % — ABNORMAL HIGH (ref 11.0–14.0)
WBC: 11.5 10*3/uL — ABNORMAL HIGH (ref 4.0–11.0)

## 2021-09-04 LAB — VH I-STAT LACTIC ACID NOTIFICATION

## 2021-09-04 LAB — I-STAT CHEM 8 CARTRIDGE
Anion Gap I-Stat: 14 (ref 7.0–16.0)
BUN I-Stat: 4 mg/dL — ABNORMAL LOW (ref 7–22)
Calcium Ionized I-Stat: 4.4 mg/dL (ref 4.35–5.10)
Chloride I-Stat: 104 mMol/L (ref 98–110)
Creatinine I-Stat: 0.5 mg/dL — ABNORMAL LOW (ref 0.80–1.30)
EGFR: 134 mL/min/{1.73_m2} (ref 60–150)
Glucose I-Stat: 213 mg/dL — ABNORMAL HIGH (ref 71–99)
Hematocrit I-Stat: 31 % — ABNORMAL LOW (ref 39.0–52.5)
Hemoglobin I-Stat: 10.5 gm/dL — ABNORMAL LOW (ref 13.0–17.5)
Potassium I-Stat: 3.3 mMol/L — ABNORMAL LOW (ref 3.5–5.3)
Sodium I-Stat: 137 mMol/L (ref 136–147)
TCO2 I-Stat: 23 mMol/L — ABNORMAL LOW (ref 24–29)

## 2021-09-04 LAB — COMPREHENSIVE METABOLIC PANEL
ALT: 13 U/L (ref 0–55)
AST (SGOT): 7 U/L — ABNORMAL LOW (ref 10–42)
Albumin/Globulin Ratio: 0.77 Ratio — ABNORMAL LOW (ref 0.80–2.00)
Albumin: 3 gm/dL — ABNORMAL LOW (ref 3.5–5.0)
Alkaline Phosphatase: 160 U/L — ABNORMAL HIGH (ref 40–145)
Anion Gap: 15.3 mMol/L (ref 7.0–18.0)
BUN / Creatinine Ratio: 7.2 Ratio — ABNORMAL LOW (ref 10.0–30.0)
BUN: 6 mg/dL — ABNORMAL LOW (ref 7–22)
Bilirubin, Total: 0.5 mg/dL (ref 0.1–1.2)
CO2: 18 mMol/L — ABNORMAL LOW (ref 20–30)
Calcium: 8.7 mg/dL (ref 8.5–10.5)
Chloride: 104 mMol/L (ref 98–110)
Creatinine: 0.83 mg/dL (ref 0.80–1.30)
EGFR: 115 mL/min/{1.73_m2} (ref 60–150)
Globulin: 3.9 gm/dL (ref 2.0–4.0)
Glucose: 418 mg/dL — ABNORMAL HIGH (ref 71–99)
Osmolality Calculated: 284 mOsm/kg (ref 275–300)
Potassium: 3.3 mMol/L — ABNORMAL LOW (ref 3.5–5.3)
Protein, Total: 6.9 gm/dL (ref 6.0–8.3)
Sodium: 134 mMol/L — ABNORMAL LOW (ref 136–147)

## 2021-09-04 LAB — VH VENOUS BLOOD GAS I-STAT NOTIFICATION

## 2021-09-04 LAB — I-STAT G3 VENOUS CARTRIDGE
BE, ISTAT: -1 mMol/L
HCO3, ISTAT: 19.3 mMol/L — ABNORMAL LOW (ref 20.0–29.0)
O2 Sat, %, ISTAT: 88 % — ABNORMAL HIGH (ref 40–70)
PCO2, ISTAT: 21.3 mm Hg — ABNORMAL LOW (ref 42.0–55.0)
PO2, ISTAT: 45 mm Hg — ABNORMAL HIGH (ref 25–35)
Room Number I-Stat: 11
TCO2 I-Stat: 20 mMol/L — ABNORMAL LOW (ref 24–29)
i-STAT FIO2: 0 %
pH, ISTAT: 7.57 — ABNORMAL HIGH (ref 7.32–7.42)

## 2021-09-04 LAB — MAGNESIUM: Magnesium: 1.5 mg/dL — ABNORMAL LOW (ref 1.6–2.6)

## 2021-09-04 LAB — VH I-STAT CHEM 8 NOTIFICATION

## 2021-09-04 LAB — PT/INR
PT INR: 1 (ref 0.9–1.1)
PT: 10.7 s (ref 9.4–11.5)

## 2021-09-04 LAB — C-REACTIVE PROTEIN: C-Reactive Protein: 16.7 mg/dL — ABNORMAL HIGH (ref 0.02–0.80)

## 2021-09-04 LAB — VH DEXTROSE STICK GLUCOSE: Glucose POCT: 416 mg/dL — ABNORMAL HIGH (ref 71–99)

## 2021-09-04 LAB — SEDIMENTATION RATE: Sed Rate: 54 mm/hr — ABNORMAL HIGH (ref 0–15)

## 2021-09-04 LAB — LIPASE: Lipase: 8 U/L (ref 8–78)

## 2021-09-04 LAB — ETHANOL: Alcohol: <10 mg/dL (ref 0–9)

## 2021-09-04 LAB — BETA-HYDROXYBUTYRATE: Betahydroxybutyrate: 0.37 mMol/L — ABNORMAL HIGH (ref 0.02–0.27)

## 2021-09-04 MED ORDER — MORPHINE SULFATE 4 MG/ML IJ/IV SOLN (WRAP)
Status: AC
Start: 2021-09-04 — End: ?
  Filled 2021-09-04: qty 1

## 2021-09-04 MED ORDER — VH POTASSIUM CHLORIDE CRYS ER 20 MEQ PO TBCR (WRAP)
40.0000 meq | EXTENDED_RELEASE_TABLET | Freq: Once | ORAL | Status: AC
Start: 2021-09-04 — End: 2021-09-04
  Administered 2021-09-04: 40 meq via ORAL

## 2021-09-04 MED ORDER — POTASSIUM CHLORIDE 10 MEQ/100ML IV SOLN
10.0000 meq | Freq: Once | INTRAVENOUS | Status: AC
Start: 2021-09-04 — End: 2021-09-05
  Administered 2021-09-04: 10 meq via INTRAVENOUS

## 2021-09-04 MED ORDER — VANCOMYCIN HCL 1.25 G IV SOLR
1250.0000 mg | Freq: Once | INTRAVENOUS | Status: AC
Start: 2021-09-04 — End: 2021-09-05
  Administered 2021-09-05: 1250 mg via INTRAVENOUS
  Filled 2021-09-04: qty 1250

## 2021-09-04 MED ORDER — IOHEXOL 350 MG/ML IV SOLN
100.0000 mL | Freq: Once | INTRAVENOUS | Status: AC | PRN
Start: 2021-09-04 — End: 2021-09-04
  Administered 2021-09-04: 100 mL via INTRAVENOUS

## 2021-09-04 MED ORDER — VH POTASSIUM CHLORIDE CRYS ER 20 MEQ PO TBCR (WRAP)
EXTENDED_RELEASE_TABLET | ORAL | Status: AC
Start: 2021-09-04 — End: ?
  Filled 2021-09-04: qty 2

## 2021-09-04 MED ORDER — VH INSULIN REGULAR HUMAN 100 UNIT/ML IJ SOLN
7.0000 [IU] | Freq: Once | INTRAMUSCULAR | Status: AC
Start: 2021-09-04 — End: 2021-09-04
  Administered 2021-09-04: 7 [IU] via INTRAVENOUS

## 2021-09-04 MED ORDER — SODIUM CHLORIDE 0.9 % IV MBP
2.0000 g | Freq: Once | INTRAVENOUS | Status: AC
Start: 2021-09-04 — End: 2021-09-05
  Administered 2021-09-05: 2 g via INTRAVENOUS
  Filled 2021-09-04: qty 2

## 2021-09-04 MED ORDER — MORPHINE SULFATE 4 MG/ML IJ/IV SOLN (WRAP)
4.0000 mg | Freq: Once | Status: AC
Start: 2021-09-04 — End: 2021-09-04
  Administered 2021-09-04: 4 mg via INTRAVENOUS

## 2021-09-04 MED ORDER — VH MAGNESIUM SULFATE 2 G IN 50 ML IV PREMIX
2.0000 g | Freq: Once | INTRAVENOUS | Status: AC
Start: 2021-09-04 — End: 2021-09-05
  Administered 2021-09-05: 2 g via INTRAVENOUS

## 2021-09-04 MED ORDER — VH SODIUM CHLORIDE 0.9 % IV BOLUS
1000.0000 mL | Freq: Once | INTRAVENOUS | Status: AC
Start: 2021-09-04 — End: 2021-09-05
  Administered 2021-09-04: 1000 mL via INTRAVENOUS

## 2021-09-04 MED ORDER — ONDANSETRON HCL 4 MG/2ML IJ SOLN
INTRAMUSCULAR | Status: AC
Start: 2021-09-04 — End: ?
  Filled 2021-09-04: qty 2

## 2021-09-04 MED ORDER — ONDANSETRON HCL 4 MG/2ML IJ SOLN
4.0000 mg | Freq: Once | INTRAMUSCULAR | Status: AC
Start: 2021-09-04 — End: 2021-09-04
  Administered 2021-09-04: 4 mg via INTRAVENOUS

## 2021-09-04 MED ORDER — VH SODIUM CHLORIDE 0.9 % IV BOLUS
1000.0000 mL | Freq: Once | INTRAVENOUS | Status: AC
Start: 2021-09-04 — End: 2021-09-04
  Administered 2021-09-04: 1000 mL via INTRAVENOUS

## 2021-09-04 MED ORDER — POTASSIUM CHLORIDE 10 MEQ/100ML IV SOLN
INTRAVENOUS | Status: AC
Start: 2021-09-04 — End: ?
  Filled 2021-09-04: qty 100

## 2021-09-04 MED ORDER — VH MAGNESIUM SULFATE 2 G IN 50 ML IV PREMIX
INTRAVENOUS | Status: AC
Start: 2021-09-04 — End: ?
  Filled 2021-09-04: qty 50

## 2021-09-04 MED ORDER — VH INSULIN REGULAR HUMAN 100 UNIT/ML IJ SOLN
INTRAMUSCULAR | Status: AC
Start: 2021-09-04 — End: ?
  Filled 2021-09-04: qty 3

## 2021-09-04 NOTE — EDIE (Signed)
COLLECTIVE?NOTIFICATION?09/04/2021 21:28?Arthur Kim, Arthur Kim?MRN: [id number]    Criteria Met      High Utilization (6+ED/6 Months)    Security and Safety  No Security Events were found.  ED Care Guidelines  There are currently no ED Care Guidelines for this patient. Please check your facility's medical records system.    Flags      History of Sepsis - Patient has received a diagnosis of Sepsis from an acute or post-acute setting. Apply appropriate clinical planning practices; to learn more visit [email or web address] / Attributed By: [employer] / Attributed On: 06/06/2021       Prescription Monitoring Program  000??- Narcotic Use Score  000??- Sedative Use Score  000??- Stimulant Use Score  000??- Overdose Risk Score  - All Scores range from 000-999 with 75% of the population scoring < 200 and on 1% scoring above 650  - The last digit of the narcotic, sedative, and stimulant score indicates the number of active prescriptions of that type  - Higher Use scores correlate with increased prescribers, pharmacies, mg equiv, and overlapping prescriptions  - Higher Overdose Risk Scores correlate with increased risk of unintentional overdose death   Concerning or unexpectedly high scores should prompt a review of the PMP record; this does not constitute checking PMP for prescribing purposes.    E.D. Visit Count (12 mo.)  Facility Visits   [facility] 1   [facility] 1   [facility] 1   [facility] 1   [facility] 1   [facility] 1   [facility] 1   [facility] 1   [facility] 1   [facility] 1   [facility] 1   [facility] 1   [facility] 1   [facility] 1   [facility] 1   [facility] 2    [facility] 1   [facility] 1   [facility] 1   [facility] [address] [facility] 1   Total 34   Note: Visits indicate total known visits.     Recent Emergency Department Visit Summary  Showing 10 most recent visits out of 34 in the past 12 months   Date Facility [facility] Type Diagnoses or Chief Complaint    Sep 04, 2021  [facility]  [place]  Emergency      Pain      Groin Pain      Foot Ulcer      Acidosis, unspecified      Aug 31, 2021  [facility].  [facility]  Emergency      Unspecified abdominal pain      Gastritis, unspecified, without bleeding      Type 2 diabetes mellitus without complications      Nausea with vomiting, unspecified      Jul 21, 2021  [facility]  [facility]  Emergency     Jul 14, 2021  [facility].  [facility]  Emergency     Jul 05, 2021  [facility].  [facility]  Emergency  Hematuria, unspecified      Pelvic and perineal pain      Urinary tract infection, site not specified      Jul 01, 2021  [facility]  [facility]  Emergency     Jun 18, 2021  [facility].  [facility]  Emergency     Jun 09, 2021  [facility]  [facility]  Emergency      Lower abdominal pain, unspecified      Long term (current) use of insulin      Dehydration      Acute gastritis without bleeding      Type 2 diabetes mellitus with hyperglycemia      Jun 08, 2021  [facility]  [facility]  Emergency      Unspecified abdominal pain      Urinary tract infection, site not specified      Nausea with vomiting, unspecified      May 30, 2021  [facility]  [facility]  Emergency       Recent Inpatient Visit Summary  Showing 10 most recent visits out of 12 in the past 12 months   Date Facility [facility] Type Diagnoses or Chief Complaint    Aug 01, 2021   [facility]  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  [place]  [place]  Telemetry      Type 1 diabetes mellitus with ketoacidosis without coma      Osteomyelitis, unspecified      Non-pressure chronic ulcer of left heel and midfoot with unspecified severity      Unspecified severe protein-calorie malnutrition      Cutaneous abscess of left foot      Cellulitis of left lower limb      Old myocardial infarction      Hypokalemia      Elevated white blood cell count, unspecified      Essential (primary) hypertension      Jul 14, 2021  [facility].  [place]  [place]  Telemetry      Non-pressure chronic ulcer of left calf with fat layer exposed      Type 1 diabetes mellitus with ketoacidosis without coma      Moderate protein-calorie malnutrition      Non-pressure chronic ulcer of left heel and midfoot with unspecified severity      Acute kidney failure, unspecified      Hyperosmolality and hypernatremia      Family history of diabetes mellitus      Family history of malignant neoplasm of other organs or systems  Personal history of malignant neoplasm of prostate      Cellulitis of left lower limb      Jul 01, 2021  [facility].  [place]  [place]  Intensive Care      Type 2 diabetes mellitus with ketoacidosis without coma      Non-pressure chronic ulcer of left heel and midfoot with  unspecified severity      Gastro-esophageal reflux disease with esophagitis, with bleeding      Duodenitis with bleeding      Gastritis, unspecified, with bleeding      Old myocardial infarction      Allergy status to other drugs, medicaments and biological substances      Allergy status to penicillin      Presence of coronary angioplasty implant and graft      Procedure and treatment not carried out because of patient's decision for other reasons      Jun 18, 2021  [facility].  [facility]  Telemetry      Patient's noncompliance with other medical treatment and regimen      Hematuria, unspecified      Type 2 diabetes mellitus with hyperglycemia      Constipation, unspecified      Acquired absence of other organs      Acute prostatitis      Other specified diseases of anus and rectum      Nicotine dependence, chewing tobacco, uncomplicated      Old myocardial infarction      Procedure and treatment not carried out because of patient's decision for other reasons      May 30, 2021  [facility]  [place]  [place]  Telemetry      Hyperlipidemia, unspecified      Other ascites      Patient's noncompliance with other medical treatment and regimen      Other specified diseases of anus and rectum      Acute prostatitis      Unspecified urethral stricture, male, unspecified site      Nicotine dependence, chewing tobacco, uncomplicated      Constipation, unspecified      Essential (primary) hypertension      Acute cystitis with hematuria      May 26, 2021  [facility]  [place]  [place]  General Medicine      1. Unspecified abdominal pain      1. Sepsis, unspecified organism      2. Hypo-osmolality and hyponatremia      3. Acute prostatitis      4. Hematuria, unspecified      5. Type 1 diabetes mellitus with hyperglycemia      6. Atherosclerotic heart disease of native coronary artery without angina pectoris      7. Hyperlipidemia, unspecified      8. Essential (primary) hypertension      9.  Chronic prostatitis      Dec 26, 2020  [facility]  [place]  [place]  General Medicine  Chief Complaint: prostate pain    Dec 18, 2020  [facility]  [place].  [place]  General Medicine      1. Sepsis, unspecified organism      1. Candidal sepsis      2. Abscess of prostate      3. Acidosis      3. Long term (current) use of insulin  4. Candidal cystitis and urethritis      5. Urethral diverticulum      5. Type 1 diabetes mellitus with hyperglycemia      6. Essential (primary) hypertension      7. Procedure and treatment not carried out because of patient's decision for other reasons      Nov 12, 2020  [facility]  [place]  [facility]  Medical Surgical      1. Sepsis, unspecified organism      2. Abscess of prostate      3. Other obstructive and reflux uropathy      4. Inflammatory disease of prostate, unspecified      5. Atherosclerotic heart disease of native coronary artery without angina pectoris      6. Benign prostatic hyperplasia without lower urinary tract symptoms      7. Type 1 diabetes mellitus with hyperglycemia      8. Benign prostatic hyperplasia with lower urinary tract symptoms      9. Other retention of urine      10. Other specified abnormal findings of blood chemistry        Care Team  Provider Specialty Phone Fax Service Dates   Kelly Splinter, APN Nurse Practitioner: Family [phone] Current    Sherolyn Buba, FNP-C Nurse Practitioner: Family [phone]  Current      [facility] Portal  This patient has registered at the [facility] Emergency Department   For more information visit: [email or web address]     PLEASE NOTE:     1.   Any care recommendations and other clinical information are provided as guidelines or for historical purposes only, and providers should exercise their own clinical judgment when providing care.    2.   You may only use this information for purposes of  treatment, payment or health care operations activities, and subject to the limitations of applicable [employer] Policies.    3.   You should consult directly with the organization that provided a care guideline or other clinical history with any questions about additional information or accuracy or completeness of information provided.    ? 2022 [place], [employer] - [email or web address]

## 2021-09-04 NOTE — ED Provider Notes (Addendum)
[facility]  EMERGENCY DEPARTMENT  History and Physical Exam     Patient Name: Arthur Kim, Arthur Kim  Encounter Date:  09/04/2021  Attending Physician: Dow Adolph, MD  Room:  C11/C11-A  Patient DOB:  06-19-1981  Age: 40 y.o. male  MRN:  [id number]  PCP: Pcp, None, MD      Diagnosis/Disposition:  MDM:     Final Impression  1. Osteomyelitis of left foot, unspecified type    2. Lactic acidosis    3. Type 2 diabetes mellitus with hyperglycemia, unspecified whether long term insulin use    4. Cellulitis of left foot    5. Diabetic ulcer of left midfoot associated with type 2 diabetes mellitus, unspecified ulcer stage    6. Sinus tachycardia    7. SIRS (systemic inflammatory response syndrome)    8. Elevated beta-hydroxybutyrate    9. Elevated sedimentation rate    10. Elevated C-reactive protein (CRP)    11. Hypomagnesemia    12. Hypokalemia      Disposition  ED Disposition       ED Disposition   Admit    Condition   --    Date/Time   Thu Sep 04, 2021 11:06 PM    Comment   Service: Medicine [[id number]]               Follow up  No follow-up provider specified.  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  Point-of-care glucose performed and was 469.  We were concerned for diabetic ketoacidosis, electrolyte derangement, metabolic disarray, renal failure, sepsis, Sirs, osteomyelitis of the left foot, cellulitis of the left foot, urinary tract infection, pyelonephritis, prostatitis, other etiologies considered.  We did do a broad work-up on the patient with multiple labs, left foot x-ray, CT imaging of his abdomen pelvis.  He was given fluids, insulin 7 units IV regular,  morphine and Zofran for symptomatic relief here in the emergency department.    Work-up revealing patient had initial elevated lactate of 2.5.  He also has a highly elevated ESR and CRP.  Normal however.  He has been taking Bactrim and doxycycline as outpatient.  His work-up is showing that he does meet SIRS criteria as he had sinus tachycardia with lactic acidosis.  He also has cellulitis of the left foot, slightly elevated beta hydroxybutyrate of 1.37.  Was not acidotic however with drip 7.5.  He was hypomagnesemic with magnesium of 1.5 which I did replete with 2 g IV magnesium.  Potassium was slightly hypokalemic at 3.4 and since he has gotten insulin further so I repleted with 40 mEq oral potassium as well as 10 mEq IV potassium.  X-ray showing that he does have osteomyelitis and possible septic joint.  I did discuss with hospitalist.  I have ordered cefepime as well 2 g IV.  I had already ordered vancomycin for the patient.  Given all these abnormalities I do believe that he warrants admission.  I did discuss all the findings with the patient.  He is in agreement.  Shared decision making for admission at this time.  He is admitted to sound hospitalist Dr. Rexanne Mano, appreciate transfer  of care.  Of note I did order CT of the abdomen pelvis with IV contrast looking for other infectious pathology such as prostatitis which is pending at this time.  Hospitalist will follow up on this.         The patient's past medical records, including those in Care Everywhere when necessary, were reviewed by me    The results of diagnostic studies have been reviewed by myself. Available past medical, family, social, and surgical histories have been reviewed by myself. The clinical impression and plan have been discussed with the patient and/or the patient's family. All questions have been answered.        History of Presenting Illness:     Nursing Triage note: Pt BIB EMS from "his truck", pt is a truck driver out of state, [employer],  ulcer to bottom left foot, no drainage noted, red/warm to touch, pt's pain is more prominent in his groin, states had catheter placed yesterday for "prostate issues". BS <500 for EMS. Sugar was 151 at 6pm for pt, took insulin as scheduled.    Chief complaint: Groin Pain and Foot Ulcer    HPI/ROS is limited by: none  HPI/ROS given by: Patient    MUMIN DENOMME is a 40 y.o. male with past medical history of prostatitis with indwelling Foley catheter, diabetes, diabetic ketoacidosis, diabetic foot ulcerations with infection requiring greater than a month hospitalization per the patient presents for left foot pain presenting with left foot pain as well as urinary pain.  Reports having dysuria, has his indwelling catheter that was placed at a hospital in [place] per the patient.  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  Also reporting dysuria and left foot pain.  Found to be hyperglycemic on arrival to emergency department.  He activated EMS from his truck.  No modifying factors reported.      Review of Systems:  Physical Exam:       Review of Systems   Gastrointestinal:  Negative for abdominal pain.   Genitourinary:  Positive for dysuria.   Musculoskeletal:  Positive for joint pain.   Endo/Heme/Allergies:         Hyperglycemia   All other systems reviewed and are negative.    Blood pressure (!) 139/95, pulse 99, temperature 99.1 F (37.3 C), temperature source Temporal, resp. rate (!) 24, height 1.753 m, weight 81.6 kg, SpO2 96 %.     Physical Exam  Vitals and nursing note reviewed. Exam conducted with a chaperone present.   Constitutional:       General: He is in acute distress.      Appearance: Normal appearance. He is normal weight. He is not toxic-appearing or diaphoretic.      Comments: Appears older than stated age, poor dentition   HENT:      Head: Normocephalic and atraumatic.      Right Ear: External ear normal.      Left Ear: External ear normal.      Nose: Nose normal. No congestion or rhinorrhea.       Mouth/Throat:      Pharynx: Oropharynx is clear.      Comments: Poor dentition.  Eyes:      General: No scleral icterus.        Right eye: No discharge.         Left eye: No discharge.      Extraocular Movements: Extraocular movements intact.      Conjunctiva/sclera: Conjunctivae normal.   Cardiovascular:  Rate and Rhythm: Regular rhythm. Tachycardia present.   Pulmonary:      Effort: Pulmonary effort is normal. No respiratory distress.   Abdominal:      General: Abdomen is flat. There is no distension.      Palpations: Abdomen is soft.      Tenderness: There is no abdominal tenderness. There is no guarding or rebound.   Genitourinary:     Penis: Normal.       Testes: Normal.      Comments: Circumcised male, Foley catheter in place attached to leg bag.  Testes normal lie, no tenderness to palpation bilateral testes.  No perineal tenderness to palpation, no evidence of Fournier's gangrene.  Musculoskeletal:         General: Swelling present. Normal range of motion.      Cervical back: Normal range of motion and neck supple.      Comments: There is swelling of the left foot.  There is a approximately dime size ulceration on the left foot dorsal aspect laterally as well as a larger half dollar size ulceration on the plantar aspect of the left foot.  There is associated erythema of the left foot.   Skin:     General: Skin is warm and dry.      Capillary Refill: Capillary refill takes less than 2 seconds.      Coloration: Skin is not jaundiced or pale.      Findings: Erythema and lesion present.      Comments: 2 skin ulcerations of the left foot, refer to picture, as well as erythema of the left foot with edema.   Neurological:      General: No focal deficit present.      Mental Status: He is alert and oriented to person, place, and time. Mental status is at baseline.      Cranial Nerves: No cranial nerve deficit.      Sensory: No sensory deficit.      Motor: No weakness.         Diagnostic Results:     LAB  STUDIES    All lab value have been personally reviewed by me    Results       Procedure Component Value Units Date/Time    CBC and differential [[id number]]  (Abnormal) Collected: 09/04/21 2215    Specimen: Blood Updated: 09/04/21 2257     WBC 11.5 K/cmm      RBC 4.01 M/cmm      Hemoglobin 11.0 gm/dL      Hematocrit 40.9 %      MCV 83 fL      MCH 28 pg      MCHC 33 gm/dL      RDW 81.1 %      PLT CT 259 K/cmm      MPV 9.1 fL      Neutrophils % 75.7 %      Lymphocytes 12.7 %      Monocytes 8.8 %      Eosinophils % 2.4 %      Basophils % 0.4 %      Neutrophils Absolute 8.7 K/cmm      Lymphocytes Absolute 1.5 K/cmm      Monocytes Absolute 1.0 K/cmm      Eosinophils Absolute 0.3 K/cmm      Basophils Absolute 0.1 K/cmm     Comprehensive metabolic panel [[id number]]  (Abnormal) Collected: 09/04/21 2215    Specimen: Plasma Updated: 09/04/21 2254     Sodium  134 mMol/L      Potassium 3.3 mMol/L      Chloride 104 mMol/L      CO2 18 mMol/L      Calcium 8.7 mg/dL      Glucose 161 mg/dL      Creatinine 0.96 mg/dL      BUN 6 mg/dL      Protein, Total 6.9 gm/dL      Albumin 3.0 gm/dL      Alkaline Phosphatase 160 U/L      ALT 13 U/L      AST (SGOT) 7 U/L      Bilirubin, Total 0.5 mg/dL      Albumin/Globulin Ratio 0.77 Ratio      Anion Gap 15.3 mMol/L      BUN / Creatinine Ratio 7.2 Ratio      EGFR 115 mL/min/1.50m2      Osmolality Calculated 284 mOsm/kg      Globulin 3.9 gm/dL     C Reactive Protein [[id number]]  (Abnormal) Collected: 09/04/21 2215    Specimen: Plasma Updated: 09/04/21 2253     C-Reactive Protein 16.70 mg/dL     Lipase [[id number]] Collected: 09/04/21 2215    Specimen: Plasma Updated: 09/04/21 2253     Lipase 8 U/L     Beta-Hydroxybutyrate [[id number]]  (Abnormal) Collected: 09/04/21 2215    Specimen: Plasma Updated: 09/04/21 2253     Betahydroxybutyrate 0.37 mMol/L     Sedimentation rate (ESR) [[id number]]  (Abnormal) Collected: 09/04/21 2215    Specimen: Blood Updated: 09/04/21 2253     Sed Rate 54 mm/hr     Magnesium  [[id number]]  (Abnormal) Collected: 09/04/21 2215    Specimen: Plasma Updated: 09/04/21 2251     Magnesium 1.5 mg/dL     Prothrombin time/INR [[id number]] Collected: 09/04/21 2215    Specimen: Blood Updated: 09/04/21 2251     PT 10.7 sec      PT INR 1.0    Ethanol (Alcohol) Level [[id number]] Collected: 09/04/21 2215    Specimen: Plasma Updated: 09/04/21 2246     Alcohol <10 mg/dL     Urinalysis w Microscopic and Culture if Indicated [[id number]]  (Abnormal) Collected: 09/04/21 2215    Specimen: Urine, Random Updated: 09/04/21 2244     Color, UA Yellow     Clarity, UA Clear     Urine Specific Gravity 1.010     pH, Urine 6.0 pH      Protein, UR Trace mg/dL      Glucose, UA >=2951 mg/dL      Ketones UA Trace     Bilirubin, UA Negative mg/dL      Blood, UA Moderate mg/dL      Nitrite, UA Negative     Urobilinogen, UA 0.2 mg/dL      Leukocyte Esterase, UA Negative Leu/uL      UR Micro Performed     WBC, UA 4 /hpf      RBC, UA 6 /hpf      Squam Epithel, UA <1 /hpf     i-Stat Lactic AcID [[id number]]  (Abnormal) Collected: 09/04/21 2229    Specimen: Venipuncture Updated: 09/04/21 2237     Room Number I-Stat 11     Sample I-Stat Venous     Site I-Stat R Radial     Lactic Acid I-Stat 2.5 mMol/L     i-Stat G3 Venous CartrIDge [[id number]]  (Abnormal) Collected: 09/04/21 2229    Specimen: Venipuncture Updated: 09/04/21 2232     pH, ISTAT 7.57  PO2, ISTAT 45 mm Hg      BE, ISTAT -1 mMol/L      HCO3, ISTAT 19.3 mMol/L      PCO2, ISTAT 21.3 mm Hg      O2 Sat, %, ISTAT 88 %      Room Number I-Stat 11     i-STAT Allen's Test N/A     DELS, ISTAT Room Air     i-STAT FIO2 0.00 %      Sample I-Stat Venous     Site I-Stat R Radial     TCO2 I-Stat 20 mMol/L      Operator, ISTAT Operator: [id number] Nita Sickle ED POCT    Lactic Acid I-Stat [[id number]] Collected: 09/04/21 2215    Specimen: ISTAT Updated: 09/04/21 2225     I-STAT Notification Istat Notification    Venous Blood Gas (VBG) (i-STAT) [[id number]] Collected: 09/04/21 2215     Specimen: ISTAT Updated: 09/04/21 2225     I-STAT Notification Istat Notification    Blood Culture - Venipuncture [[id number]] Collected: 09/04/21 2215    Specimen: Blood from Venipuncture Updated: 09/04/21 2222    Urine Drug Screen - No Confirmation [[id number]] Collected: 09/04/21 2215    Specimen: Urine, Random Updated: 09/04/21 2215    Dextrose Stick Glucose [[id number]]  (Abnormal) Collected: 09/04/21 2130    Specimen: Blood Updated: 09/04/21 2132     Glucose POCT 416 mg/dL             RADIOLOGIC STUDIES    All images have been personally viewed by me    XR Foot Left AP Lateral And Oblique    Result Date: 09/04/2021  There is periostitis of the fourth metatarsal with suggestion of osteomyelitis of the fourth metatarsal head and possibly base of the fourth proximal phalanx. I suspect that there is septic arthritis of the fourth MTP joint. There is cellulitis of the foot laterally and possible deep soft tissue gas. ReadingStation:[id number]       EKG:       EKG:   Last EKG Result       None             PROCEDURES       Critical Care  Performed by: Dow Adolph, MD  Authorized by: Dow Adolph, MD     Critical care provider statement:     Critical care time (minutes):  60    Critical care start time:  09/04/2021 10:26 PM    Critical care end time:  09/04/2021 11:26 PM    Critical care time was exclusive of:  Separately billable procedures and treating other patients and teaching time    Critical care was necessary to treat or prevent imminent or life-threatening deterioration of the following conditions:  Sepsis and endocrine crisis    Critical care was time spent personally by me on the following activities:  Development of treatment plan with patient or surrogate, discussions with consultants, evaluation of patient's response to treatment, examination of patient, interpretation of cardiac output measurements, obtaining history from patient or surrogate, re-evaluation of patient's condition, ordering and  review of radiographic studies, ordering and review of laboratory studies and ordering and performing treatments and interventions    I assumed direction of critical care for this patient from another provider in my specialty: no      Care discussed with: admitting provider          ORDERS PLACED THIS VISIT     Orders  Orders Placed This Encounter  Procedures    Critical Care    Blood Culture - Venipuncture    Blood Culture - Venipuncture    XR Foot Left AP Lateral And Oblique    CT Abdomen Pelvis with IV Cont    Dextrose Stick Glucose    Collect Blood    CBC and differential    Comprehensive metabolic panel    Prothrombin time/INR    C Reactive Protein    Sedimentation rate (ESR)    Lipase    Beta-Hydroxybutyrate    Magnesium    Urinalysis w Microscopic and Culture if Indicated    Venous Blood Gas (VBG) (i-STAT)    Lactic Acid I-Stat    Ethanol (Alcohol) Level    Urine Drug Screen - No Confirmation    Lactic Acid I-Stat    Chem 8 plus only (i-STAT)    i-Stat G3 Venous CartrIDge    i-Stat Lactic AcID    ECG 12 lead (Stat) (Cardiac Related)    Saline lock IV    ED Admission Request       Medications  Medications   vancomycin (VANCOCIN) 1,250 mg in sodium chloride 0.9 % 250 mL IVPB (vial-mate) (has no administration in time range)   sodium chloride 0.9 % bolus 1,000 mL (1,000 mLs Intravenous New Bag 09/04/21 2312)   potassium chloride 10 mEq in 100 mL IVPB (premix) (10 mEq Intravenous New Bag 09/04/21 2315)   magnesium sulfate 2 g in 50 mL IVPB (Premix) 2 g (has no administration in time range)   cefepime (MAXIPIME) 2 g in sodium chloride 0.9 % 100 mL IVPB mini-bag plus (has no administration in time range)   sodium chloride 0.9 % bolus 1,000 mL (0 mLs Intravenous Stopped 09/04/21 2311)   insulin regular (HumuLIN R) injection 7 Units (7 Units Intravenous Given 09/04/21 2208)   ondansetron (ZOFRAN) injection 4 mg (4 mg Intravenous Given 09/04/21 2209)   morphine injection 4 mg (4 mg Intravenous Given 09/04/21 2209)    potassium chloride (KLOR-CON) CR tablet 40 mEq (40 mEq Oral Given 09/04/21 2311)              Allergies & Medications:     Pt has No Known Allergies.    Current/Home Medications    ASPIRIN 81 MG CHEWABLE TABLET    Chew 81 mg by mouth daily    COLCHICINE 0.6 MG TABLET    Take 0.6 mg by mouth daily    INSULIN DETEMIR (LEVEMIR) 100 UNIT/ML INJECTION    Inject into the skin    LISINOPRIL (ZESTRIL) 5 MG TABLET    Take 5 mg by mouth daily           Past History:     Medical:   Past Medical History:   Diagnosis Date    Acute renal failure (ARF)     CAD (coronary artery disease)     CVA (cerebral vascular accident)     Diabetes mellitus type 1     Essential hypertension     Hx of cardiac cath     Unstable angina        Surgical:   Past Surgical History:   Procedure Laterality Date    ABDOMINAL SURGERY      APPENDECTOMY (OPEN)      CARDIAC CATHETERIZATION  04/12/2019    CARDIAC CATHETERIZATION  03/16/2019    CHOLECYSTECTOMY         Family: History reviewed. No pertinent family history.    Social:  reports that he  does not drink alcohol and does not use drugs. No history on file for tobacco use.        ATTESTATIONS     Dow Adolph, MD    The results of diagnostic studies have been reviewed by myself. The above past medical, family, social, and surgical histories have been reviewed by myself. The clinical impression and plan have been discussed with the patient and/or the patient's family. All questions have been answered.    Note:  This chart was generated by an EMR and may contain errors, including typographical, or omissions not intended by the user. This chart was generated by the Epic EMR system/speech recognition and may contain inherent errors or omissions not intended by the user. Grammatical errors, random word insertions, deletions, pronoun errors and incomplete sentences are occasional consequences of this technology due to software limitations. Not all errors are caught or corrected. If there are questions or  concerns about the content of this note or information contained within the body of this dictation they should be addressed directly with the Thereasa Parkin for clarification            Dow Adolph, MD  09/04/21 [id number]       Dow Adolph, MD  09/04/21 [phone]

## 2021-09-05 ENCOUNTER — Encounter: Payer: Self-pay | Admitting: Student in an Organized Health Care Education/Training Program

## 2021-09-05 ENCOUNTER — Inpatient Hospital Stay: Payer: Medicare Other | Admitting: Anesthesiology

## 2021-09-05 ENCOUNTER — Inpatient Hospital Stay: Payer: Medicare Other

## 2021-09-05 ENCOUNTER — Encounter
Admission: EM | Disposition: A | Discharge status: Home Health | Payer: Self-pay | Source: Home / Self Care | Attending: Internal Medicine

## 2021-09-05 DIAGNOSIS — M869 Osteomyelitis, unspecified: Secondary | ICD-10-CM | POA: Diagnosis present

## 2021-09-05 HISTORY — PX: DEBRIDEMENT & IRRIGATION, LOWER EXTREMITY: SHX3682

## 2021-09-05 LAB — VH DEXTROSE STICK GLUCOSE
Glucose POCT: 295 mg/dL — ABNORMAL HIGH (ref 71–99)
Glucose POCT: 193 mg/dL — ABNORMAL HIGH (ref 71–99)
Glucose POCT: 151 mg/dL — ABNORMAL HIGH (ref 71–99)
Glucose POCT: 86 mg/dL (ref 71–99)
Glucose POCT: 68 mg/dL — ABNORMAL LOW (ref 71–99)
Glucose POCT: 144 mg/dL — ABNORMAL HIGH (ref 71–99)
Glucose POCT: 83 mg/dL (ref 71–99)

## 2021-09-05 LAB — COMPREHENSIVE METABOLIC PANEL
ALT: 20 U/L (ref 0–55)
AST (SGOT): 23 U/L (ref 10–42)
Albumin/Globulin Ratio: 0.7 Ratio — ABNORMAL LOW (ref 0.80–2.00)
Albumin: 2.6 gm/dL — ABNORMAL LOW (ref 3.5–5.0)
Alkaline Phosphatase: 188 U/L — ABNORMAL HIGH (ref 40–145)
Anion Gap: 12.3 mMol/L (ref 7.0–18.0)
BUN / Creatinine Ratio: 7.7 Ratio — ABNORMAL LOW (ref 10.0–30.0)
BUN: 5 mg/dL — ABNORMAL LOW (ref 7–22)
Bilirubin, Total: 0.4 mg/dL (ref 0.1–1.2)
CO2: 20 mMol/L (ref 20–30)
Calcium: 7.8 mg/dL — ABNORMAL LOW (ref 8.5–10.5)
Chloride: 107 mMol/L (ref 98–110)
Creatinine: 0.65 mg/dL — ABNORMAL LOW (ref 0.80–1.30)
EGFR: 124 mL/min/{1.73_m2} (ref 60–150)
Globulin: 3.7 gm/dL (ref 2.0–4.0)
Glucose: 260 mg/dL — ABNORMAL HIGH (ref 71–99)
Osmolality Calculated: 278 mOsm/kg (ref 275–300)
Potassium: 3.3 mMol/L — ABNORMAL LOW (ref 3.5–5.3)
Protein, Total: 6.3 gm/dL (ref 6.0–8.3)
Sodium: 136 mMol/L (ref 136–147)

## 2021-09-05 LAB — CBC AND DIFFERENTIAL
Basophils %: 0.4 % (ref 0.0–3.0)
Basophils Absolute: 0 10*3/uL (ref 0.0–0.3)
Eosinophils %: 3.2 % (ref 0.0–7.0)
Eosinophils Absolute: 0.3 10*3/uL (ref 0.0–0.8)
Hematocrit: 34.4 % — ABNORMAL LOW (ref 39.0–52.5)
Hemoglobin: 10.6 gm/dL — ABNORMAL LOW (ref 13.0–17.5)
Lymphocytes Absolute: 1.3 10*3/uL (ref 0.6–5.1)
Lymphocytes: 13.3 % — ABNORMAL LOW (ref 15.0–46.0)
MCH: 26 pg — ABNORMAL LOW (ref 28–35)
MCHC: 31 gm/dL — ABNORMAL LOW (ref 32–36)
MCV: 86 fL (ref 80–100)
MPV: 8.8 fL (ref 6.0–10.0)
Monocytes Absolute: 0.9 10*3/uL (ref 0.1–1.7)
Monocytes: 9.2 % (ref 3.0–15.0)
Neutrophils %: 73.9 % (ref 42.0–78.0)
Neutrophils Absolute: 7.4 10*3/uL (ref 1.7–8.6)
PLT CT: 231 10*3/uL (ref 130–440)
RBC: 4.02 10*6/uL (ref 4.00–5.70)
RDW: 16.5 % — ABNORMAL HIGH (ref 11.0–14.0)
WBC: 9.9 10*3/uL (ref 4.0–11.0)

## 2021-09-05 LAB — MAGNESIUM: Magnesium: 2 mg/dL (ref 1.6–2.6)

## 2021-09-05 LAB — HEMOGLOBIN A1C
Estimated Average Glucose: 203 mg/dL
Hgb A1C, %: 8.7 %

## 2021-09-05 LAB — VH CULTURE AND SMEAR, WOUND

## 2021-09-05 LAB — VH EXTRA SPECIMEN LABEL

## 2021-09-05 SURGERY — DEBRIDEMENT AND IRRIGATION, LOWER EXTREMITY
Anesthesia: Anesthesia MAC / Sedation | Site: Foot | Laterality: Left | Wound class: Dirty or Infected

## 2021-09-05 MED ORDER — VH DEXTROSE 10 % IV BOLUS (ADULT)
125.0000 mL | INTRAVENOUS | Status: DC | PRN
Start: 2021-09-05 — End: 2021-09-11
  Filled 2021-09-05: qty 250

## 2021-09-05 MED ORDER — METOPROLOL SUCCINATE ER 50 MG PO TB24
50.0000 mg | ORAL_TABLET | Freq: Every day | ORAL | Status: DC
Start: 2021-09-05 — End: 2021-09-11
  Administered 2021-09-05 – 2021-09-11 (×7): 50 mg via ORAL
  Filled 2021-09-05 (×7): qty 1

## 2021-09-05 MED ORDER — ENOXAPARIN SODIUM 40 MG/0.4ML IJ SOSY
40.0000 mg | PREFILLED_SYRINGE | INTRAMUSCULAR | Status: DC
Start: 2021-09-05 — End: 2021-09-11
  Filled 2021-09-05 (×5): qty 0.4

## 2021-09-05 MED ORDER — VANCOMYCIN HCL 1.5 G IV SOLR
1500.0000 mg | Freq: Two times a day (BID) | INTRAVENOUS | Status: DC
Start: 2021-09-05 — End: 2021-09-08
  Administered 2021-09-05 – 2021-09-08 (×7): 1500 mg via INTRAVENOUS
  Filled 2021-09-05 (×8): qty 1500

## 2021-09-05 MED ORDER — INSULIN LISPRO (1 UNIT DIAL) 100 UNIT/ML SC SOPN
1.0000 [IU] | PEN_INJECTOR | Freq: Every evening | SUBCUTANEOUS | Status: DC
Start: 2021-09-05 — End: 2021-09-07

## 2021-09-05 MED ORDER — FENTANYL CITRATE (PF) 50 MCG/ML IJ SOLN (WRAP)
INTRAMUSCULAR | Status: AC
Start: 2021-09-05 — End: ?
  Filled 2021-09-05: qty 2

## 2021-09-05 MED ORDER — TICAGRELOR 90 MG PO TABS
90.0000 mg | ORAL_TABLET | Freq: Two times a day (BID) | ORAL | Status: DC
Start: 2021-09-05 — End: 2021-09-11
  Administered 2021-09-05 – 2021-09-11 (×11): 90 mg via ORAL
  Filled 2021-09-05 (×14): qty 1

## 2021-09-05 MED ORDER — INSULIN LISPRO (1 UNIT DIAL) 100 UNIT/ML SC SOPN
1.0000 [IU] | PEN_INJECTOR | Freq: Every day | SUBCUTANEOUS | Status: DC | PRN
Start: 2021-09-05 — End: 2021-09-07
  Administered 2021-09-05: 4 [IU] via SUBCUTANEOUS
  Filled 2021-09-05: qty 3

## 2021-09-05 MED ORDER — VH POTASSIUM CHLORIDE CRYS ER 20 MEQ PO TBCR (WRAP)
40.0000 meq | EXTENDED_RELEASE_TABLET | Freq: Once | ORAL | Status: AC
Start: 2021-09-05 — End: 2021-09-05
  Administered 2021-09-05: 40 meq via ORAL
  Filled 2021-09-05: qty 2

## 2021-09-05 MED ORDER — BUPIVACAINE HCL (PF) 0.5 % IJ SOLN
INTRAMUSCULAR | Status: DC | PRN
Start: 2021-09-05 — End: 2021-09-05
  Administered 2021-09-05: 10 mL

## 2021-09-05 MED ORDER — COLCHICINE 0.6 MG PO TABS
0.6000 mg | ORAL_TABLET | Freq: Every day | ORAL | Status: DC
Start: 2021-09-05 — End: 2021-09-11
  Administered 2021-09-05 – 2021-09-11 (×7): 0.6 mg via ORAL
  Filled 2021-09-05 (×7): qty 1

## 2021-09-05 MED ORDER — VH PROPOFOL INFUSION 10 MG/ML (WRAPPED)
INTRAVENOUS | Status: DC | PRN
Start: 2021-09-05 — End: 2021-09-05
  Administered 2021-09-05: 25 ug/kg/min via INTRAVENOUS

## 2021-09-05 MED ORDER — VH DEXTROSE 5 % IN LACTATED RINGERS IV BOLUS
500.0000 mL | Freq: Once | INTRAVENOUS | Status: DC | PRN
Start: 2021-09-05 — End: 2021-09-05
  Administered 2021-09-05: 500 mL via INTRAVENOUS

## 2021-09-05 MED ORDER — ASPIRIN 81 MG PO CHEW
81.0000 mg | CHEWABLE_TABLET | Freq: Every day | ORAL | Status: DC
Start: 2021-09-05 — End: 2021-09-11
  Administered 2021-09-05 – 2021-09-11 (×7): 81 mg via ORAL
  Filled 2021-09-05 (×7): qty 1

## 2021-09-05 MED ORDER — OXYCODONE HCL 5 MG PO TABS
5.0000 mg | ORAL_TABLET | Freq: Once | ORAL | Status: AC | PRN
Start: 2021-09-05 — End: 2021-09-05
  Administered 2021-09-05: 5 mg via ORAL
  Filled 2021-09-05: qty 1

## 2021-09-05 MED ORDER — GADOTERATE MEGLUMINE 10 MMOL/20ML IV SOLN
15.0000 mL | Freq: Once | INTRAVENOUS | Status: AC | PRN
Start: 2021-09-05 — End: 2021-09-05
  Administered 2021-09-05: 7.5 mmol via INTRAVENOUS

## 2021-09-05 MED ORDER — MEPERIDINE HCL 25 MG/ML IJ SOLN
12.5000 mg | Freq: Once | INTRAMUSCULAR | Status: DC | PRN
Start: 2021-09-05 — End: 2021-09-05

## 2021-09-05 MED ORDER — MORPHINE SULFATE 4 MG/ML IJ/IV SOLN (WRAP)
2.0000 mg | Freq: Four times a day (QID) | Status: DC | PRN
Start: 2021-09-05 — End: 2021-09-07
  Administered 2021-09-05 – 2021-09-07 (×8): 2 mg via INTRAVENOUS
  Filled 2021-09-05 (×8): qty 1

## 2021-09-05 MED ORDER — LISINOPRIL 5 MG PO TABS
5.0000 mg | ORAL_TABLET | Freq: Every day | ORAL | Status: DC
Start: 2021-09-05 — End: 2021-09-05

## 2021-09-05 MED ORDER — ISOSORBIDE MONONITRATE ER 60 MG PO TB24
60.0000 mg | ORAL_TABLET | Freq: Every day | ORAL | Status: DC
Start: 2021-09-05 — End: 2021-09-11
  Administered 2021-09-05 – 2021-09-11 (×7): 60 mg via ORAL
  Filled 2021-09-05 (×7): qty 1

## 2021-09-05 MED ORDER — VANCOMYCIN HCL 1.25 G IV SOLR
1250.0000 mg | Freq: Two times a day (BID) | INTRAVENOUS | Status: DC
Start: 2021-09-05 — End: 2021-09-05
  Filled 2021-09-05: qty 1250

## 2021-09-05 MED ORDER — INSULIN LISPRO (1 UNIT DIAL) 100 UNIT/ML SC SOPN
2.0000 [IU] | PEN_INJECTOR | Freq: Three times a day (TID) | SUBCUTANEOUS | Status: DC
Start: 2021-09-05 — End: 2021-09-07
  Administered 2021-09-05 – 2021-09-06 (×4): 2 [IU] via SUBCUTANEOUS
  Filled 2021-09-05: qty 3

## 2021-09-05 MED ORDER — LACTATED RINGERS IV SOLN
50.0000 mL/h | INTRAVENOUS | Status: DC
Start: 2021-09-05 — End: 2021-09-06

## 2021-09-05 MED ORDER — EPHEDRINE SULFATE 50 MG/ML IJ/IV SOLN (WRAP)
10.0000 mg | Status: DC | PRN
Start: 2021-09-05 — End: 2021-09-05
  Administered 2021-09-05: 10 mg via INTRAVENOUS

## 2021-09-05 MED ORDER — VH VANCOMYCIN THERAPY PLACEHOLDER
Status: DC
Start: 2021-09-05 — End: 2021-09-08

## 2021-09-05 MED ORDER — GLUCAGON 1 MG IJ SOLR (WRAP)
1.0000 mg | INTRAMUSCULAR | Status: DC | PRN
Start: 2021-09-05 — End: 2021-09-11

## 2021-09-05 MED ORDER — GABAPENTIN 300 MG PO CAPS
300.0000 mg | ORAL_CAPSULE | Freq: Three times a day (TID) | ORAL | Status: DC
Start: 2021-09-05 — End: 2021-09-11
  Administered 2021-09-05 – 2021-09-11 (×19): 300 mg via ORAL
  Filled 2021-09-05 (×19): qty 1

## 2021-09-05 MED ORDER — SODIUM CHLORIDE (PF) 0.9 % IJ SOLN
0.4000 mg | INTRAMUSCULAR | Status: DC | PRN
Start: 2021-09-05 — End: 2021-09-11

## 2021-09-05 MED ORDER — ACETAMINOPHEN 650 MG RE SUPP
650.0000 mg | RECTAL | Status: DC | PRN
Start: 2021-09-05 — End: 2021-09-11

## 2021-09-05 MED ORDER — MIDAZOLAM HCL 1 MG/ML IJ SOLN (WRAP)
INTRAMUSCULAR | Status: DC | PRN
Start: 2021-09-05 — End: 2021-09-05
  Administered 2021-09-05: 2 mg via INTRAVENOUS

## 2021-09-05 MED ORDER — ACETAMINOPHEN 160 MG/5ML PO SOLN
650.0000 mg | ORAL | Status: DC | PRN
Start: 2021-09-05 — End: 2021-09-11

## 2021-09-05 MED ORDER — BUPIVACAINE HCL (PF) 0.5 % IJ SOLN
INTRAMUSCULAR | Status: AC
Start: 2021-09-05 — End: ?
  Filled 2021-09-05: qty 30

## 2021-09-05 MED ORDER — PROPOFOL 200 MG/20ML IV EMUL
INTRAVENOUS | Status: AC
Start: 2021-09-05 — End: ?
  Filled 2021-09-05: qty 20

## 2021-09-05 MED ORDER — VH HYDROMORPHONE HCL PF 1 MG/ML CARPUJECT
0.4000 mg | INTRAMUSCULAR | Status: DC | PRN
Start: 2021-09-05 — End: 2021-09-05

## 2021-09-05 MED ORDER — FENTANYL CITRATE (PF) 50 MCG/ML IJ SOLN (WRAP)
25.0000 ug | INTRAMUSCULAR | Status: DC | PRN
Start: 2021-09-05 — End: 2021-09-05

## 2021-09-05 MED ORDER — SODIUM CHLORIDE 0.9 % IV MBP
2.0000 g | Freq: Two times a day (BID) | INTRAVENOUS | Status: DC
Start: 2021-09-05 — End: 2021-09-08
  Administered 2021-09-05 – 2021-09-08 (×7): 2 g via INTRAVENOUS
  Filled 2021-09-05 (×7): qty 2

## 2021-09-05 MED ORDER — EPHEDRINE SULFATE 50 MG/ML IJ/IV SOLN (WRAP)
Status: AC
Start: 2021-09-05 — End: 2021-09-06
  Filled 2021-09-05: qty 1

## 2021-09-05 MED ORDER — ACETAMINOPHEN 325 MG PO TABS
650.0000 mg | ORAL_TABLET | ORAL | Status: DC | PRN
Start: 2021-09-05 — End: 2021-09-11
  Administered 2021-09-07: 650 mg via ORAL
  Filled 2021-09-05: qty 2

## 2021-09-05 MED ORDER — DROPERIDOL 2.5 MG/ML IJ SOLN
0.6250 mg | Freq: Once | INTRAMUSCULAR | Status: DC | PRN
Start: 2021-09-05 — End: 2021-09-05

## 2021-09-05 MED ORDER — LACTATED RINGERS IV SOLN
INTRAVENOUS | Status: DC | PRN
Start: 2021-09-05 — End: 2021-09-05

## 2021-09-05 MED ORDER — SODIUM CHLORIDE (PF) 0.9 % IJ SOLN
3.0000 mL | Freq: Two times a day (BID) | INTRAMUSCULAR | Status: DC
Start: 2021-09-05 — End: 2021-09-11
  Administered 2021-09-05 – 2021-09-11 (×13): 3 mL via INTRAVENOUS

## 2021-09-05 MED ORDER — HYDROCODONE-ACETAMINOPHEN 5-325 MG PO TABS
1.0000 | ORAL_TABLET | ORAL | Status: DC | PRN
Start: 2021-09-05 — End: 2021-09-11
  Administered 2021-09-05 – 2021-09-06 (×3): 2 via ORAL
  Administered 2021-09-06 (×2): 1 via ORAL
  Administered 2021-09-06: 2 via ORAL
  Administered 2021-09-07 (×2): 1 via ORAL
  Administered 2021-09-07 – 2021-09-08 (×3): 2 via ORAL
  Administered 2021-09-08: 1 via ORAL
  Administered 2021-09-09 – 2021-09-11 (×4): 2 via ORAL
  Filled 2021-09-05: qty 2
  Filled 2021-09-05 (×3): qty 1
  Filled 2021-09-05: qty 2
  Filled 2021-09-05 (×2): qty 1
  Filled 2021-09-05 (×5): qty 2
  Filled 2021-09-05: qty 1
  Filled 2021-09-05 (×4): qty 2

## 2021-09-05 MED ORDER — GLYCOPYRROLATE 0.2 MG/ML IJ SOLN (WRAP)
INTRAMUSCULAR | Status: DC | PRN
Start: 2021-09-05 — End: 2021-09-05
  Administered 2021-09-05: .2 mg via INTRAVENOUS

## 2021-09-05 MED ORDER — VH DEXMEDETOMIDINE HCL 200 MCG/2ML IV SOLN BOLUS FROM BAG
INTRAVENOUS | Status: DC | PRN
Start: 2021-09-05 — End: 2021-09-05
  Administered 2021-09-05 (×2): 12 ug via INTRAVENOUS
  Administered 2021-09-05: 16 ug via INTRAVENOUS

## 2021-09-05 MED ORDER — MORPHINE SULFATE 2 MG/ML IJ/IV SOLN (WRAP)
2.0000 mg | Freq: Once | Status: AC
Start: 2021-09-05 — End: 2021-09-05
  Administered 2021-09-05: 2 mg via INTRAVENOUS
  Filled 2021-09-05: qty 1

## 2021-09-05 MED ORDER — INSULIN DETEMIR 100 UNIT/ML SC SOPN
50.0000 [IU] | PEN_INJECTOR | Freq: Two times a day (BID) | SUBCUTANEOUS | Status: DC
Start: 2021-09-05 — End: 2021-09-05
  Filled 2021-09-05: qty 3

## 2021-09-05 MED ORDER — MIDAZOLAM HCL 1 MG/ML IJ SOLN (WRAP)
INTRAMUSCULAR | Status: AC
Start: 2021-09-05 — End: ?
  Filled 2021-09-05: qty 2

## 2021-09-05 MED ORDER — LANTUS SOLOSTAR 100 UNIT/ML SC SOPN
50.0000 [IU] | PEN_INJECTOR | Freq: Two times a day (BID) | SUBCUTANEOUS | Status: DC
Start: 2021-09-05 — End: 2021-09-07
  Administered 2021-09-05 – 2021-09-07 (×4): 50 [IU] via SUBCUTANEOUS
  Filled 2021-09-05: qty 3

## 2021-09-05 MED ORDER — FENTANYL CITRATE (PF) 50 MCG/ML IJ SOLN (WRAP)
INTRAMUSCULAR | Status: DC | PRN
Start: 2021-09-05 — End: 2021-09-05
  Administered 2021-09-05 (×3): 25 ug via INTRAVENOUS

## 2021-09-05 MED ORDER — ONDANSETRON HCL 4 MG/2ML IJ SOLN
4.0000 mg | Freq: Once | INTRAMUSCULAR | Status: DC | PRN
Start: 2021-09-05 — End: 2021-09-05

## 2021-09-05 SURGICAL SUPPLY — 24 items
BANDAGE ESMARK 4IN  STERILE LF (Dressings) IMPLANT
BANDAGE KLING 2 STERILE (Dressings) ×2 IMPLANT
BANDAGE KLING 3 STERILE (Dressings) IMPLANT
BANDAGE SOF BAND KERLIX 4.5 IN (Dressings) ×2 IMPLANT
BLADE SCALPEL #15 (Supply) ×6 IMPLANT
CONTAINER STER SPEC PEEL/PACK (Supply) IMPLANT
CUFF TOURNIQUET 24 (Supply) IMPLANT
CULTURETTE BBL (Supply) IMPLANT
GAUZE PLAIN PACKING 1 IN X 5Y (Dressings) IMPLANT
GLOVE BIOGEL UT PI SURG SZ 7 (Supply) ×2 IMPLANT
GOWN AURORA NONREINF STER LG (Supply) ×6 IMPLANT
NDL HYPO 25GA X 1.5 (Supply) ×2 IMPLANT
PACK EXTREMITY-LF (Supply) ×2 IMPLANT
PAD ABD STERILE 8 X 10 (Dressings) IMPLANT
SET STRYKER HANDPIECE NO TIP (Supply) ×2 IMPLANT
SHEET EXTREMITY T DRAPE (Supply) ×2 IMPLANT
SLEEVE ARM DISP SINGLE-USE (Supply) IMPLANT
SOL SALINE IRRIG 3000ML (Supply) ×2 IMPLANT
SOL SALINE IRRIG 500ML (Solutions) ×2 IMPLANT
SOL WATER STERILE IRRG 500ML (Supply) ×2 IMPLANT
SUT MONOCRYL 4-0 Y426H (Supply) ×2 IMPLANT
SYR CONTROL LL 10ML W/GRIP (Supply) ×2 IMPLANT
TOWEL BLUE STERILE 6 PER PK (Supply) ×4 IMPLANT
UNDERPAD BLUE 23X36  LF (Supply) ×2 IMPLANT

## 2021-09-05 NOTE — Progress Notes (Signed)
Pharmacy Vancomycin Dosing Consult Note  Arthur Kim    Assessment:   Indication: osteomyelitis, diabetes, SIRS  Day #1 vancomycin      Plan:   Vancomycin 1500 mg IV q12H  Vancomycin Pharmacokinetic target: AUC24 (range) 400-600 mg/L/hr  Serum creatinine daily  Levels: Peak:     Trough level    AUC Random  MRSA nares   Pharmacy will follow the patient's renal function, vancomycin levels, and dosing during the course of therapy. If you have any questions, please contact the pharmacist at [phone].      Age: 40 y.o.  Height: 1.753 m (5\' 9" )  Weight:  81.6 kg (180 lb)  IBW: 71 kg     Baseline Population Estimated Kinetics: Individual Kinetics (Once Levels Resulted):   SCr: 0.7 mg/dL    CrCl: 604 ml/min    Vancomycin Clearance: 5 L/hr    Volume: 68 L  For patients with BMI >40, [facility] will assume a standard Volume of     t50 (half-life): 9 hours SCr:  mg/dL    CrCl:  ml/min    Vancomycin Clearance:  L/hr    Volume:  L  For patients with BMI >40, [facility] will assume a standard Volume of 25 L    t50 (half-life):  hours     Expected AUC24 - steady state, Trough - steady state, and Risk of nephrotoxicity  Goal: AUC24,ss: 400 - 600 mg/L/hr  Goal: Probability of AUC24 > 400: Greater than 70%  Goal: Probability of nephrotoxicity: Less than 20%            Historic Patient Regimen   Date Regimen Weight SCr CrCl Trough Level               Recent Labs   Lab 09/05/21  0310 09/04/21  2345 09/04/21  2215   Creatinine 0.65*  --  0.83   Creatinine I-Stat  --  0.50*  --    BUN 5*  --  6*   WBC 9.9  --  11.5*     Temp (24hrs), Avg:99.4 F (37.4 C), Min:99.1 F (37.3 C), Max:100 F (37.8 C)    Patient Vitals for the past 12 hrs:   BP Temp Pulse Resp   09/05/21 0412 124/72 99.1 F (37.3 C) (!) 108 22   09/05/21 0224 136/84 100 F (37.8 C) (!) 108 20   09/04/21 2318 141/87 -- -- --   09/04/21 2231 (!) 139/95 -- -- --   09/04/21 2131 139/83 99.1 F (37.3 C) 99 (!) 24        Microbiology Results (last 15 days)       Procedure  Component Value Units Date/Time    MRSA Culture [[id number]]     Order Status: Sent Specimen: Nares     Wound Culture and Smear [[id number]]     Order Status: Sent Specimen: Wound from Left Foot     Blood Culture - Venipuncture [[id number]] Collected: 09/04/21 2344    Order Status: Completed Specimen: Blood from Venipuncture Updated: 09/05/21 0007     Culture Result --     Blood culture volume for this collection was less than the optimal 5 ml needed to achieve adequate sensitivity.  Culture In Progress      Blood Culture - Venipuncture [[id number]] Collected: 09/04/21 2215    Order Status: Completed Specimen: Blood from Venipuncture Updated: 09/05/21 0007     Culture Result Culture In Progress

## 2021-09-05 NOTE — Progress Notes (Signed)
Brief Note:    Patient seen and examined.  Admitted by Dr. Scheryl Marten earlier this AM.  H&P, labs and radiographic studies reviewed.  Patient reports pain in left foot.  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    Continue vanco and cefepime  Podiatry consulted - discussed with Dr. Oswaldo Done  Plan for MRI today and I&D tonight

## 2021-09-05 NOTE — H&P (Signed)
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 I suspect that there is septic arthritis of the fourth MTP joint.  -ESR of 53, CRP 16.7  -Blood cultures ordered  -wound Cultures   -Started on vancomycin and cefepime  -podiatry consultation in the a.m.    #Mild DKA present on admission  #Type 1 diabetes:  -Blood sugar in 400s, lactate anion gap 15, bicarb of 18, beta hydroxybutyrate 0.37  -Given IV fluids, regular insulin 7 units  -Repeat glucose 200, bicarb 23  -We will continue with home dose Lantus 50 twice daily  -SSI scale Accu-Cheks    #Hypokalemia  #Hypomagnesemia  -Magnesium 1.5, potassium 3.3  -Replaced  -Continue to monitor    #History of coronary disease status post PCI and stent  -Continue aspirin and Brilinta    #History of hypertension  -Continue metoprolol succinate and Imdur    #Prostatitis:  #BPH  -Has a Foley catheter in place due to enlarged prostate and incomplete bladder emptying   -Can remove catheter and have voiding trial while inpatient    DVT PPx: Medication VTE Prophylaxis Orders: enoxaparin (LOVENOX) syringe 40 mg  Mechanical VTE Prophylaxis Orders: Mechanical VTE: Pneumatic Compression; Knee high  Dispo: Inpatient  Code: full code     History of Presenting Illness                                CC: left foot pain  Arthur Kim is a 40 y.o. male patient with past medical history of prostatitis with  indwelling Foley catheter, diabetes, diabetic ketoacidosis, diabetic foot ulcerations presented with worsening left foot pain . He states He has been with having diabetic ulcer with recurrent infections requiring greater than a month hospitalization currently on doxycycline and Bactri. 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    In the ED he was medically stable afebrile remarkable for cytosis, elevated CRP ESR, elevated lactic acid 2.5 x-ray left foot showed possible osteomyelitis of the left fourth toe patient be admitted for further management  Past Medical History:   Diagnosis Date    Acute renal failure (ARF)     CAD (coronary artery disease)  CVA (cerebral vascular accident)     Diabetes mellitus type 1     Essential hypertension     Hx of cardiac cath     Unstable angina      Past Surgical History:   Procedure Laterality Date    ABDOMINAL SURGERY      APPENDECTOMY (OPEN)      CARDIAC CATHETERIZATION  04/12/2019    CARDIAC CATHETERIZATION  03/16/2019    CHOLECYSTECTOMY         History reviewed. No pertinent family history.  Social History     Tobacco Use    Smoking status: Unknown   Vaping Use    Vaping Use: Never used   Substance Use Topics    Alcohol use: Never    Drug use: Never            Subjective     Review of Systems:  Review of systems as noted in HPI. Full 12 point ROS otherwise negative.         Objective   Physical Exam:     Vitals: T:100 F (37.8 C) (Temporal),  BP:136/84, HR:(!) 108, RR:20, SaO2:98%    1) General Appearance: Alert and oriented x 4. In no acute distress.   2) Eyes: Pink conjunctiva, anicteric sclera. Pupils are equally reactive to  light.  3) ENT: Oral mucosa moist with no pharyngeal congestion, erythema or swelling.  4) Neck: Supple, with full range of motion. Trachea is central, no JVD noted  5) Chest: Clear to auscultation bilaterally, no wheezes or rhonchi.  6) CVS: normal rate and regular rhythm, with no murmurs.  7) Abdomen: Soft, non-tender, no palpable mass. Bowel sounds normal. No CVA tenderness, Foley catheter in place  8) Extremities: Left foot plantar ulcer with diffuse erythema and tenderness, pulses palpable  9) Skin: as above   10) Lymphatics: No lymphadenopathy in axillary, cervical and inguinal area.   11) Neurological: Cranial nerves II-XII intact. No gross focal motor or sensory deficits noted.  12) Psychiatric: Affect is appropriate. No hallucinations.    EKG: Per my review shows sinus tachycardia      Patient Vitals for the past 12 hrs:   BP Temp Pulse Resp   09/05/21 0224 136/84 100 F (37.8 C) (!) 108 20   09/04/21 2318 141/87 -- -- --   09/04/21 2231 (!) 139/95 -- -- --   09/04/21 2131 139/83 99.1 F (37.3 C) 99 (!) 24     Weight Monitoring 09/04/2021   Height 175.3 cm   Height Method Estimated   Weight 81.647 kg   Weight Method Estimated   BMI (calculated) 26.6 kg/m2           LABS:  Estimated Creatinine Clearance: 200.3 mL/min (A) (based on SCr of 0.5 mg/dL (L)).   Recent Results (from the past 24 hour(s))   Dextrose Stick Glucose    Collection Time: 09/04/21  9:30 PM   Result Value Ref Range    Glucose POCT 416 (H) 71 - 99 mg/dL   CBC and differential    Collection Time: 09/04/21 10:15 PM   Result Value Ref Range    WBC 11.5 (H) 4.0 - 11.0 K/cmm    RBC 4.01 4.00 - 5.70 M/cmm    Hemoglobin 11.0 (L) 13.0 - 17.5 gm/dL    Hematocrit 16.1 (L) 39.0 - 52.5 %    MCV 83 80 - 100 fL    MCH 28 28 - 35 pg    MCHC 33 32 - 36  gm/dL    RDW 47.8 (H) 29.5 - 14.0 %    PLT CT 259 130 - 440 K/cmm    MPV 9.1 6.0 - 10.0 fL    Neutrophils % 75.7 42.0 - 78.0 %    Lymphocytes 12.7 (L) 15.0 - 46.0 %    Monocytes 8.8 3.0 - 15.0 %     Eosinophils % 2.4 0.0 - 7.0 %    Basophils % 0.4 0.0 - 3.0 %    Neutrophils Absolute 8.7 (H) 1.7 - 8.6 K/cmm    Lymphocytes Absolute 1.5 0.6 - 5.1 K/cmm    Monocytes Absolute 1.0 0.1 - 1.7 K/cmm    Eosinophils Absolute 0.3 0.0 - 0.8 K/cmm    Basophils Absolute 0.1 0.0 - 0.3 K/cmm   Comprehensive metabolic panel    Collection Time: 09/04/21 10:15 PM   Result Value Ref Range    Sodium 134 (L) 136 - 147 mMol/L    Potassium 3.3 (L) 3.5 - 5.3 mMol/L    Chloride 104 98 - 110 mMol/L    CO2 18 (L) 20 - 30 mMol/L    Calcium 8.7 8.5 - 10.5 mg/dL    Glucose 621 (H) 71 - 99 mg/dL    Creatinine 3.08 6.57 - 1.30 mg/dL    BUN 6 (L) 7 - 22 mg/dL    Protein, Total 6.9 6.0 - 8.3 gm/dL    Albumin 3.0 (L) 3.5 - 5.0 gm/dL    Alkaline Phosphatase 160 (H) 40 - 145 U/L    ALT 13 0 - 55 U/L    AST (SGOT) 7 (L) 10 - 42 U/L    Bilirubin, Total 0.5 0.1 - 1.2 mg/dL    Albumin/Globulin Ratio 0.77 (L) 0.80 - 2.00 Ratio    Anion Gap 15.3 7.0 - 18.0 mMol/L    BUN / Creatinine Ratio 7.2 (L) 10.0 - 30.0 Ratio    EGFR 115 60 - 150 mL/min/1.27m2    Osmolality Calculated 284 275 - 300 mOsm/kg    Globulin 3.9 2.0 - 4.0 gm/dL   Prothrombin time/INR    Collection Time: 09/04/21 10:15 PM   Result Value Ref Range    PT 10.7 9.4 - 11.5 sec    PT INR 1.0 0.9 - 1.1   C Reactive Protein    Collection Time: 09/04/21 10:15 PM   Result Value Ref Range    C-Reactive Protein 16.70 (H) 0.02 - 0.80 mg/dL   Sedimentation rate (ESR)    Collection Time: 09/04/21 10:15 PM   Result Value Ref Range    Sed Rate 54 (H) 0 - 15 mm/hr   Lipase    Collection Time: 09/04/21 10:15 PM   Result Value Ref Range    Lipase 8 8 - 78 U/L   Beta-Hydroxybutyrate    Collection Time: 09/04/21 10:15 PM   Result Value Ref Range    Betahydroxybutyrate 0.37 (H) 0.02 - 0.27 mMol/L   Magnesium    Collection Time: 09/04/21 10:15 PM   Result Value Ref Range    Magnesium 1.5 (L) 1.6 - 2.6 mg/dL   Urinalysis w Microscopic and Culture if Indicated    Collection Time: 09/04/21 10:15 PM    Specimen: Urine,  Random   Result Value Ref Range    Color, UA Yellow Colorless,Light-Yellow,Yellow    Clarity, UA Clear Clear    Urine Specific Gravity 1.010 1.005 - 1.030    pH, Urine 6.0 5.0 - 8.0 pH    Protein, UR Trace (A) Negative mg/dL  Glucose, UA >=1000 (A) Negative mg/dL    Ketones UA Trace (A) Negative    Bilirubin, UA Negative Negative mg/dL    Blood, UA Moderate (A) Negative mg/dL    Nitrite, UA Negative Negative    Urobilinogen, UA 0.2 0.2 mg/dL    Leukocyte Esterase, UA Negative Negative Leu/uL    UR Micro Performed     WBC, UA 4 0 - 4 /hpf    RBC, UA 6 (H) 0 - 4 /hpf    Squam Epithel, UA <1 0 - 2 /hpf   Venous Blood Gas (VBG) (i-STAT)    Collection Time: 09/04/21 10:15 PM   Result Value Ref Range    I-STAT Notification Istat Notification    Blood Culture - Venipuncture    Collection Time: 09/04/21 10:15 PM    Specimen: Venipuncture; Blood   Result Value Ref Range    Culture Result Culture In Progress    Lactic Acid I-Stat    Collection Time: 09/04/21 10:15 PM   Result Value Ref Range    I-STAT Notification Istat Notification    Ethanol (Alcohol) Level    Collection Time: 09/04/21 10:15 PM   Result Value Ref Range    Alcohol <10 0 - 9 mg/dL   i-Stat G3 Venous CartrIDge    Collection Time: 09/04/21 10:29 PM   Result Value Ref Range    pH, ISTAT 7.57 (H) 7.32 - 7.42    PO2, ISTAT 45 (H) 25 - 35 mm Hg    BE, ISTAT -1 mMol/L    HCO3, ISTAT 19.3 (L) 20.0 - 29.0 mMol/L    PCO2, ISTAT 21.3 (L) 42.0 - 55.0 mm Hg    O2 Sat, %, ISTAT 88 (H) 40 - 70 %    Room Number I-Stat 11     i-STAT Allen's Test N/A     DELS, ISTAT Room Air     i-STAT FIO2 0.00 %    Sample I-Stat Venous     Site I-Stat R Radial     TCO2 I-Stat 20 (L) 24 - 29 mMol/L    Operator, ISTAT Operator: [id number] Nita Sickle ED POCT    i-Stat Lactic AcID    Collection Time: 09/04/21 10:29 PM   Result Value Ref Range    Room Number I-Stat 11     Sample I-Stat Venous     Site I-Stat R Radial     Lactic Acid I-Stat 2.5 (HH) 0.5 - 1.9 mMol/L   ECG 12 lead (Stat) (Cardiac  Related)    Collection Time: 09/04/21 10:51 PM   Result Value Ref Range    Patient Age 54 years    Patient DOB 04/17/1981     Patient Height      Patient Weight      Interpretation Text       Sinus tachycardia, rate 120, PR 168, QTc 420, no STEMI  No previous ECG available for comparison  Electronically Signed On 09-04-2021 23:52:35 EST by Dow Adolph      Physician Interpreter Navid Behrooz     Ventricular Rate 120 //min    QRS Duration 72 ms    P-R Interval 168 ms    Q-T Interval 297 ms    Q-T Interval(Corrected) 420 ms    P Wave Axis 79 deg    QRS Axis 48 deg    T Axis  years   Lactic Acid I-Stat    Collection Time: 09/04/21 11:38 PM   Result Value Ref Range    I-STAT  Notification Istat Notification    Chem 8 plus only (i-STAT)    Collection Time: 09/04/21 11:38 PM   Result Value Ref Range    I-STAT Notification Istat Notification    Blood Culture - Venipuncture    Collection Time: 09/04/21 11:44 PM    Specimen: Venipuncture; Blood   Result Value Ref Range    Culture Result       Blood culture volume for this collection was less than the optimal 5 ml needed to achieve adequate sensitivity.  Culture In Progress     i-Stat Chem 8 CartrIDge    Collection Time: 09/04/21 11:45 PM   Result Value Ref Range    Sodium I-Stat 137 136 - 147 mMol/L    Potassium I-Stat 3.3 (L) 3.5 - 5.3 mMol/L    Chloride I-Stat 104 98 - 110 mMol/L    TCO2 I-Stat 23 (L) 24 - 29 mMol/L    Calcium Ionized I-Stat 4.40 4.35 - 5.10 mg/dL    Glucose I-Stat 161 (H) 71 - 99 mg/dL    Creatinine I-Stat 0.96 (L) 0.80 - 1.30 mg/dL    BUN I-Stat 4 (L) 7 - 22 mg/dL    Anion Gap I-Stat 04.5 7.0 - 16.0    EGFR 134 60 - 150 mL/min/1.62m2    Hematocrit I-Stat 31.0 (L) 39.0 - 52.5 %    Hemoglobin I-Stat 10.5 (L) 13.0 - 17.5 gm/dL   i-Stat Lactic AcID    Collection Time: 09/04/21 11:45 PM   Result Value Ref Range    Room Number I-Stat 11     Sample I-Stat Venous     Site I-Stat R Radial     Lactic Acid I-Stat 1.9 0.5 - 1.9 mMol/L          No Known Allergies    XR Foot Left AP Lateral And Oblique    Result Date: 09/04/2021  There is periostitis of the fourth metatarsal with suggestion of osteomyelitis of the fourth metatarsal head and possibly base of the fourth proximal phalanx. I suspect that there is septic arthritis of the fourth MTP joint. There is cellulitis of the foot laterally and possible deep soft tissue gas. ReadingStation:[email or web address]    CT Abdomen Pelvis with IV Cont    Result Date: 09/05/2021  1. There is mild circumferential wall thickening of the rectum and distal sigmoid colon with mild adjacent stranding. This could be related to nondistention or represent a focal proctitis. No free fluid or abnormal gas collections are noted. Large amount  of gas and stool are seen in the proximal colon. 2. Foley catheter is present within the urinary bladder. There is mild stranding associated with the prostate and seminal vesicles as well and cannot exclude acute inflammation. 3. Dependent soft tissue edema. ReadingStation:[email or web address]      Home Medications       Med List Status: In Progress Set By: Bradd Canary, RN at 09/04/2021  9:44 PM              aspirin 81 MG chewable tablet     Chew 81 mg by mouth daily     colchicine 0.6 MG tablet     Take 0.6 mg by mouth daily     insulin detemir (LEVEMIR) 100 UNIT/ML injection     Inject into the skin     lisinopril (ZESTRIL) 5 MG tablet     Take 5 mg by mouth daily           Meds given in the ED:  Medications   vancomycin (VANCOCIN) 1,250 mg in sodium chloride 0.9 % 250 mL IVPB (vial-mate) (1,250 mg Intravenous New Bag 09/05/21 0154)   cefepime (MAXIPIME) 2 g in sodium chloride 0.9 % 100 mL IVPB mini-bag plus (has no administration in time range)   aspirin chewable tablet 81 mg (has no administration in time range)   colchicine tablet 0.6 mg (has no administration in time range)   lisinopril (ZESTRIL) tablet 5 mg (has no administration in time range)   insulin detemir (LEVEMIR FLEXPEN) injection pen 50 Units (has no  administration in time range)   dextrose (D10W) 10% bolus 125 mL (has no administration in time range)   glucagon (rDNA) (GLUCAGEN) injection 1 mg (has no administration in time range)   sodium chloride (PF) 0.9 % injection 3 mL (has no administration in time range)   naloxone (NARCAN) injection 0.4 mg (has no administration in time range)   insulin lispro (1 Unit Dial) (HumaLOG) injection pen 2-24 Units (has no administration in time range)     And   insulin lispro (1 Unit Dial) (HumaLOG) injection pen 1-12 Units (has no administration in time range)     And   insulin lispro (1 Unit Dial) (HumaLOG) injection pen 1-12 Units (has no administration in time range)   enoxaparin (LOVENOX) syringe 40 mg (has no administration in time range)   acetaminophen (TYLENOL) tablet 650 mg (has no administration in time range)     Or   acetaminophen (TYLENOL) 160 MG/5ML oral solution 650 mg (has no administration in time range)     Or   acetaminophen (TYLENOL) suppository 650 mg (has no administration in time range)   HYDROcodone-acetaminophen (NORCO) 5-325 MG per tablet 1-2 tablet (has no administration in time range)   morphine injection 2 mg (2 mg Intravenous Given 09/05/21 0228)   vancomycin (VANCOCIN) 1,250 mg in sodium chloride 0.9 % 250 mL IVPB (vial-mate) (has no administration in time range)   cefepime (MAXIPIME) 2 g in sodium chloride 0.9 % 100 mL IVPB mini-bag plus (has no administration in time range)   vancomycin therapy placeholder (has no administration in time range)   sodium chloride 0.9 % bolus 1,000 mL (0 mLs Intravenous Stopped 09/04/21 2311)   insulin regular (HumuLIN R) injection 7 Units (7 Units Intravenous Given 09/04/21 2208)   ondansetron (ZOFRAN) injection 4 mg (4 mg Intravenous Given 09/04/21 2209)   morphine injection 4 mg (4 mg Intravenous Given 09/04/21 2209)   sodium chloride 0.9 % bolus 1,000 mL (0 mLs Intravenous Stopped 09/05/21 0041)   potassium chloride (KLOR-CON) CR tablet 40 mEq (40 mEq Oral  Given 09/04/21 2311)   potassium chloride 10 mEq in 100 mL IVPB (premix) (0 mEq Intravenous Stopped 09/05/21 0041)   magnesium sulfate 2 g in 50 mL IVPB (Premix) 2 g (2 g Intravenous New Bag 09/05/21 0045)   iohexol (OMNIPAQUE) 350 MG/ML injection 100 mL (100 mLs Intravenous Imaging Agent Given 09/04/21 2356)      Time Spent:     Rickard Rhymes, MD     09/05/21,2:43 AM   MRN: [id number]                                      CSN: [id number] DOB: Aug 31, 1981

## 2021-09-05 NOTE — Anesthesia Postprocedure Evaluation (Signed)
Anesthesia Post Evaluation    Patient: Arthur Kim    Procedure(s):  DEBRIDEMENT & IRRIGATION OF LEFT FOOT    Anesthesia type: MAC    Last Vitals:   Vitals Value Taken Time   BP 84/45 09/05/21 1930   Temp 36.6 C (97.9 F) 09/05/21 1908   Pulse 82 09/05/21 1930   Resp 5 09/05/21 1930   SpO2 94 % 09/05/21 1930                 Anesthesia Post Evaluation:     Patient Evaluated: bedside  Patient Participation: complete - patient participated  Level of Consciousness: awake and alert    Pain Management: adequate    Airway Patency: patent    Anesthetic complications: No      PONV Status: controlled    Cardiovascular status: acceptable  Respiratory status: acceptable  Hydration status: acceptable        Signed by: Adele Dan, MD, 09/05/2021 7:41 PM

## 2021-09-05 NOTE — Plan of Care (Signed)
Patient:  Arthur Kim, Arthur Kim, 40 y.o. 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  Vital signs are stable and pain is addressed.  Plan of care was discussed.   Vitals:      BP: 113/66 (09/05/2021  8:13 PM)  Heart Rate: 94 (09/05/2021  8:13 PM)  Temp: 97.9 F (36.6 C) (09/05/2021  8:13 PM)  Resp Rate: 18 (09/05/2021  8:13 PM)   Oxygen Needs:    SpO2: 96 % (09/05/2021  8:13 PM)  O2 Device: None (Room air) (09/05/2021  8:13 PM)      Last 3 Weights:    Recent Weights for the past 720 hrs (Last 3 readings):   Weight   09/04/21 2131 81.6 kg (180 lb)       Tele:   Last order of [facility]H TELEMETRY MONITORING was found on 09/05/2021 from Hospital Encounter on 09/04/2021         Cardiac Rhythm: Sinus Tach     Drips:         Current Facility-Administered Medications   Medication Dose Last Admin    lactated ringers infusion  50 mL/hr        I/O: Last 24 hours:    UOP:  Foley/Drain LDA(s):       Intake/Output Summary (Last 24 hours) at 09/05/2021 2314  Last data filed at 09/05/2021 1856  Gross per 24 hour   Intake 1400 ml   Output 603 ml   Net 797 ml        Drain Output:  Urethral Catheter (Active)   Catheter necessity reviewed? No 09/05/21 1900   Urine Output (mL) Total 600 mL 09/05/21  1113    No order of INSERT FOLEY CATHETER is found.     No order of EXTERNAL URINARY CATHETER is found.     If there is a catheter present (internal or external)   "Should we continue the catheter?"  []  Remove   []  Keep           Last BM: Last BM Date: 09/04/21       Unmeasured Output This Shift:    No data found.     VTE PPX:       Current Facility-Administered Medications (Includes Only Anticoagulants)   Medication Dose Route Frequency Provider Last Rate Last Admin    enoxaparin (LOVENOX) syringe 40 mg  40 mg Subcutaneous Q24H Alyacoub, Ramez I, MD           Skin Issues/Concerns:      Skin issues noted this shift:      Wound 09/05/21 Diabetic/ Neuropathy Foot Anterior;Left 4-81mm circular scabbed wound on top of foot (Active)   09/05/21 0524 Foot   Wound Type: Diabetic/ Neuropathy   Pressure Injury Staging (WOCN/ Trained RNs Only):    Wound Location Orientation:  Anterior;Left   Wound Description (Comments): 4-26mm circular scabbed wound on top of foot   Present on Admission: Yes   WOCN Wound Description (Comments):    Non-staged Wound Description:    Site Description Edema;Red 09/05/21 2220   Peri-wound Description Red 09/05/21 2220       Wound 09/05/21 Diabetic/ Neuropathy Heel Left 1.5-2 inch in diameter open reddened/pink wound to pad of foot (Active)   09/05/21 0525 Heel   Wound Type: Diabetic/ Neuropathy   Pressure Injury Staging (WOCN/ Trained RNs Only):    Wound Location Orientation: Left   Wound Description (Comments): 1.5-2 inch in diameter open reddened/pink wound to pad of foot   Present on Admission: Yes   WOCN Wound Description (Comments):    Non-staged Wound Description:        Wound 09/05/21 Surgical Incision Foot Left 4x4 gauze soaked in saline, kling and ace bandage, clean and dry (Active)   09/05/21 1855 Foot   Wound Type: Surgical Incision   Pressure Injury Staging (WOCN/ Trained RNs Only):    Wound Location Orientation: Left   Wound Description (Comments): 4x4 gauze soaked in saline, kling and  ace bandage, clean and dry   Present on Admission:    WOCN Wound Description (Comments):    Non-staged Wound Description:    Site Description Dressing covering site (UTA) 09/05/21 2220   Closure Approximated 09/05/21 1900   Dressing Gauze Bandage Roll;Ace Wrap 09/05/21 1900   Dressing Changed New 09/05/21 1900   Dressing Status Clean;Dry;Intact 09/05/21 2220         Chem Sticks (Hyper/Hypo)       Glucose POCT   Date Value Ref Range Status   09/05/2021 83 71 - 99 mg/dL Final     Comment:     The above 1 analytes were performed by [facility] [phone])  [address],[place] [address]         Mobility:        PMP Activity: Step 3 - Bed Mobility (09/05/2021 10:34 PM)      Falls Risk:   Risk Category: Moderate      PT/OT (recommendations):      No data recorded    Care Act Designee to care for patient after discharge:      Designate Care Provider?: Refused (09/05/2021  2:00 AM)      Care Partner(s) who will help during hospital stay:      No data recorded    Patient Rounding, Team Members Present    []  Rounds Occurred   []  Patient    []  MD    []  CM    []  Primary RN    []  Charge Nurse    []  Pharmacist      ("In a few words, as a patient, do you have any goals or concerns you would like to share  with the team today?" )     Patient stated goals and/or concerns:          Action plan:                       Problem: Inadequate Cardiac Output  Goal: Adequate tissue perfusion will be maintained  Description: Interventions:  1. Monitor/assess vital signs  2. Monitor/assess lab values and report abnormal values  3. Monitor/assess neurovascular status (pulses, capillary refill, pain, paresthesia, paralysis, presence of edema)  4. Monitor intake and output  5. Monitor/assess for signs of VTE (edema of calf/thigh redness, pain)  6. Monitor for signs and symptoms of a pulmonary embolism (dyspnea, tachypnea, tachycardia, confusion)  7. VTE Prevention: Administer anticoagulant(s) and/or apply anti-embolism  stockings/devices as ordered  8. Encourage/assist patient as needed to turn, cough, and perform deep breathing every 2 hours  9. Reinforce use of ordered respiratory interventions (i.e. CPAP, BiPAP, Incentive Spirometer, Acapella, etc.)  10. Perform active/passive ROM  11. Reinforce ankle pump exercises  12. Increase mobility as tolerated/progressive mobility  13. Elevate feet  14. Position patient for maximum circulation/cardiac output  15. Place shoes or other foot protection on patient  16. Assess and monitor skin integrity  17. Provide wound/skin care  Outcome: Progressing     Problem: Infection  Goal: Free from infection  Description: Interventions:  1. Utilize isolation precautions per protocol  2. Assess immunization status  3. Consult/collaborate with Infection Preventionist   4. Assess for signs/symptoms of infection  5. Utilize sepsis protocol  Outcome: Progressing     Problem: Compromised skin integrity  Goal: Skin integrity is maintained or improved  Description: Interventions:  1. Assess Braden Scale every shift  2. Turn or reposition patient every 2 hours or as needed unless able to reposition self  3. Increase activity as tolerated/progressive mobility  4. Relieve pressure to bony prominences  5. Avoid shearing   6. Keep skin clean and dry   7. Encourage use of lotion/moisturizer on skin   8. Monitor patient's hygiene practices   9. Collaborate with Wound, Ostomy, and Continence Nurse  10. Utilize specialty bed  11. Keep head of bed 30 degrees or less (unless contraindicated)  Outcome: Progressing     Problem: Compromised Tissue integrity  Goal: Damaged tissue is healing and protected  Description: Interventions:  1. Monitor/assess Braden scale every shift  2. Provide wound care per wound care algorithm  3. Reposition patient every 2 hours and as needed unless able to reposition self  4. Increase activity as tolerated/progressive mobility  5. Relieve pressure to bony prominences for patients at  moderate and high risk  6. Avoid shearing injuries   7. Keep intact skin clean and dry  8. Use bath wipes, not soap and water, for daily bathing   9. Use incontinence wipes for cleaning urine, stool and caustic drainage; Foley care as needed   10. Monitor external devices/tubes for correct placement to prevent pressure, friction and shearing   11. Encourage use of lotion/moisturizer on skin  12. Monitor patient's hygiene practices  13. Consult/collaborate with wound care nurse   14. Utilize specialty bed  15. Consider placing an indwelling catheter if incontinence interferes with healing of stage 3 or 4 pressure injury  Outcome: Progressing  Goal: Nutritional status is improving  Description: Interventions:  1. Assist patient with eating   2. Allow adequate time for meals   3. Encourage patient to take dietary supplement(s) as ordered   4. Collaborate with Clinical Nutritionist  5. Include patient/patient care companion in decisions related to nutrition  Outcome: Progressing     Problem: Moderate/High Fall Risk Score >5  Goal: Patient will remain free of falls  Outcome: Progressing     Problem: Diabetes: Glucose Imbalance  Goal: Blood glucose stable at established goal  Description: Interventions:  1. Monitor lab values  2. Monitor intake and output.  Notify LIP if urine output is < 30 mL/hour   3. Follow fluid restrictions/IV/PO parameters  4. Include patient/family in decisions related to nutrition/dietary selections  5. Assess for hypoglycemia /hyperglycemia  6. Monitor/assess vital signs  7. Coordinate medication administration with meals, as indicated  8. Ensure appropriate diet and assess tolerance  9. Ensure adequate hydration  10. Ensure appropriate consults are obtained (Nutrition, Diabetes Education, and Case Management)  11. Ensure patient/family has adequate teaching materials  Outcome: Progressing

## 2021-09-05 NOTE — Progress Notes (Signed)
Pharmacy Vancomycin Dosing Consult Note  Arthur Kim    Assessment:   Indication: osteomyelitis, diabetes, SIRS  Day #1 vancomycin + cefepime for this 40 yo male who presented with worsening left foot pain. PMH includes prostatitis with indwelling foley catheter, DM, DKA, and recurring diabetic foot ulcerations. Recent hospitalization over a month ago for this infection and discharged on doxycycline and bactrim with no improvement.   Afebrile, WBC improved to 9.9, Scr at baseline. Blood and wound cx in progress. MRSA nares in progress.     Plan:   Vancomycin 1500 mg IV q12H  Vancomycin Pharmacokinetic target: AUC24 (range) 400-600 mg/L/hr  Serum creatinine daily  Trough level at 0800 on 12/17  MRSA nares in progress  Pharmacy will follow the patient's renal function, vancomycin levels, and dosing during the course of therapy. If you have any questions, please contact the pharmacist at [phone].        Expected AUC24 - steady state, Trough - steady state, and Risk of nephrotoxicity  Goal: AUC24,ss: 400 - 600 mg/L/hr  Goal: Probability of AUC24 > 400: Greater than 70%  Goal: Probability of nephrotoxicity: Less than 20%   12/16  Regimen: 1500 mg IV every 12 hours.  Exposure target: AUC24 (range)400-600 mg/L.hr   AUC24,ss: 557 mg/L.hr  Probability of AUC24 > 400: 80 %  Ctrough,ss: 16.8 mg/L  Probability of Ctrough,ss > 20: 37 %  Probability of nephrotoxicity (Lodise CID 2009): 12 %

## 2021-09-05 NOTE — Plan of Care (Signed)
Patient:  Arthur Kim, Arthur Kim, 40 y.o. 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]  Cellulitis of left foot [L03.116]  Elevated beta-hydroxybutyrate [R78.89]  Osteomyelitis of left foot, unspecified type [M86.9]  Diabetic ulcer of left midfoot associated with type 2 diabetes mellitus, unspecified ulcer stage [E11.621, L97.429]  Type 2 diabetes mellitus with hyperglycemia, unspecified whether long term insulin use [E11.65]    Events (last 24 hrs/overnight):        Nursing Shift Summary:         Vitals:      BP: 136/84 (09/05/2021  2:24 AM)  Heart Rate: (!) 108 (09/05/2021  2:24 AM)  Temp: 100 F (37.8 C) (09/05/2021  2:24 AM)  Resp Rate: 20 (09/05/2021  2:24 AM)   Oxygen Needs:    SpO2: 98 % (09/05/2021  2:24 AM)      Last 3 Weights:    Recent Weights for the past 720 hrs (Last 3 readings):   Weight   09/04/21 2131 81.6 kg (180 lb)       Tele:   Last order of VH TELEMETRY MONITORING was found on 09/05/2021 from Hospital Encounter on 09/04/2021         Cardiac Rhythm: Sinus Tach     Drips:         Current Facility-Administered Medications   Medication Dose Last Admin      I/O: Last 24 hours:    UOP:  Foley/Drain LDA(s):       Intake/Output Summary (Last 24 hours) at 09/05/2021 0242  Last data filed at 09/05/2021 0041  Gross per 24 hour   Intake 1100 ml   Output --   Net 1100 ml        Drain Output:    No order of INSERT FOLEY CATHETER is found.     No order of EXTERNAL URINARY CATHETER is found.     If there is a catheter present (internal or external)   "Should we continue the catheter?"  []  Remove   []  Keep           Last BM: No data recorded       Unmeasured Output This Shift:    No data found.     VTE PPX:       Current Facility-Administered  Medications (Includes Only Anticoagulants)   Medication Dose Route Frequency Provider Last Rate Last Admin    enoxaparin (LOVENOX) syringe 40 mg  40 mg Subcutaneous Q24H Alyacoub, Ramez I, MD           Skin Issues/Concerns:      Skin issues noted this shift:             Chem Sticks (Hyper/Hypo)       Glucose POCT   Date Value Ref Range Status   09/04/2021 416 (H) 71 - 99 mg/dL Final     Comment:     The above 1 analytes were performed by [facility] [phone])  [address],[place] [address]         Mobility:      No data recorded    Falls Risk:   Risk Category: Low      PT/OT (recommendations):      No data recorded    Care Act Designee to  care for patient after discharge:    No data recorded    Care Partner(s) who will help during hospital stay:      No data recorded    Patient Rounding, Team Members Present    []  Rounds Occurred   []  Patient    []  MD    []  CM    []  Primary RN    []  Charge Nurse    []  Pharmacist      ("In a few words, as a patient, do you have any goals or concerns you would like to share  with the team today?" )     Patient stated goals and/or concerns:          Action plan:                       Problem: Inadequate Cardiac Output  Goal: Adequate tissue perfusion will be maintained  Outcome: Progressing  Flowsheets (Taken 09/05/2021 0242)  Adequate tissue perfusion will be maintained:   Monitor/assess vital signs   Monitor/assess lab values and report abnormal values   Monitor/assess neurovascular status (pulses, capillary refill, pain, paresthesia, paralysis, presence of edema)   Monitor intake and output   Monitor for signs and symptoms of a pulmonary embolism (dyspnea, tachypnea, tachycardia, confusion)   Monitor/assess for signs of VTE (edema of calf/thigh redness, pain)   VTE Prevention: Administer anticoagulant(s) and/or apply anti-embolism stockings/devices as ordered   Encourage/assist patient as needed to turn, cough, and perform deep breathing every 2 hours      Problem: Infection  Goal: Free from infection  Outcome: Progressing  Flowsheets (Taken 09/05/2021 0242)  Free from infection:   Assess for signs/symptoms of infection   Assess immunization status   Utilize isolation precautions per protocol/policy   Consult/collaborate with Infection Preventionist   Utilize sepsis protocol     Problem: Compromised skin integrity  Goal: Skin integrity is maintained or improved  Outcome: Progressing  Flowsheets (Taken 09/05/2021 0242)  Skin integrity is maintained or improved:   Assess Braden Scale every shift   Turn or reposition patient every 2 hours or as needed unless able to reposition self   Increase activity as tolerated/progressive mobility   Relieve pressure to bony prominences   Avoid shearing   Encourage use of lotion/moisturizer on skin   Keep skin clean and dry   Monitor patient's hygiene practices   Collaborate with Wound, Ostomy, and Continence Nurse   Utilize specialty bed   Keep head of bed 30 degrees or less (unless contraindicated)

## 2021-09-05 NOTE — UM Notes (Signed)
[facility] Utilization Management Review Sheet    Facility :  [facility]    NAME: Arthur Kim  MR#: [id number]    CSN#: [id number]    ROOM: 435/435-A AGE: 40 y.o.    Date of Birth: August 06, 1981    ADMIT DATE AND TIME: 09/04/2021  9:28 PM      PATIENT CLASS: INPT     ATTENDING PHYSICIAN: Islam, Kamrul, DO  PAYOR:Payor: MEDICARE / Plan: MEDICARE PART A AND B / Product Type: Medicare /       AUTH #:     DIAGNOSIS:     ICD-10-CM    1. Osteomyelitis of left foot, unspecified type  M86.9       2. Lactic acidosis  E87.20       3. Type 2 diabetes mellitus with hyperglycemia, unspecified whether long term insulin use  E11.65       4. Cellulitis of left foot  L03.116       5. Diabetic ulcer of left midfoot associated with type 2 diabetes mellitus, unspecified ulcer stage  E11.621     L97.429       6. Sinus tachycardia  R00.0       7. SIRS (systemic inflammatory response syndrome)  R65.10       8. Elevated beta-hydroxybutyrate  R78.89       9. Elevated sedimentation rate  R70.0       10. Elevated C-reactive protein (CRP)  R79.82       11. Hypomagnesemia  E83.42       12. Hypokalemia  E87.6       13. Proctitis  K62.89           HISTORY:   Past Medical History:   Diagnosis Date    Acute renal failure (ARF)     CAD (coronary artery disease)     CVA (cerebral vascular accident)     Diabetes mellitus type 1     Essential hypertension     Hx of cardiac cath     Unstable angina        DATE OF REVIEW: 09/05/2021    VITALS: BP 108/65   Pulse 93   Temp 98.4 F (36.9 C) (Temporal)   Resp 20   Ht 1.753 m (5\' 9" )   Wt 81.6 kg (180 lb)   SpO2 97%   BMI 26.58 kg/m     Active Hospital Problems    Diagnosis    Osteomyelitis of left foot       DATE OF SERVICE 09/05/2021    PT PRESENTED TO THE ED 09/04/21 WITH C/O  left foot diabetic ulcerations requiring wound VAC who is currently on antibiotics with Bactrim and doxycycline presents for dysuria reported prostatitis as well as left foot pain.  Point-of-care glucose performed  and was 469.     Vitals: T:100 F (37.8 C) (Temporal),  BP:136/84-139/95, HR:(!) 108, RR:20-24 SaO2:98%    ED TX:    LABS  UA  BLOOD CULTURES  URINE DRUG SCREEN  CT ABD/PELVIS W  XR LEFT FOOT  ECG    ED MEDS:   IV NS BOLUS X 2  IV ZOFRAN  IV INSULIN  IV KCL X 2  IV VANCOMYCIN   IV MAGNESIUM  IV MORPHINE         CLINICAL IMPRESSION:   OSTEOMYELITIS OF LEFT FOOT  LACTIC ACIDOSIS  TYPE 2  DM W/HYPERGLYCEMIA  CELLULITIS OF LEFT FOOT  DIABETIC ULCER  SINUS TACHYCARDIA  SIRS  ELEVATED  BETA HYDROX  ELEVATED SED RATE  ELEVATED C-REACTIVE PROT  HYPOMAGNESEMIA  HYPOKALEMIA      ED DISPOSITION-ADMIT INPT MEDICINE 09/05/2021 @ 0116 AM    ADMIT MD NOTES:       Assessment and Plan:                                                                 #Diabetic ulcer of the left foot with cellulitis:  #Possible osteomyelitis of the fourth left toe  #Possible septic arthritis of the left fourth MTP joint  #Lactic acidosis  -hemodynamically stable afebrile  -Xray There is periostitis of the fourth metatarsal with suggestion of osteomyelitis of the fourth metatarsal head and possibly base of the fourth proximal phalanx. I suspect that there is septic arthritis of the fourth MTP joint.  -ESR of 53, CRP 16.7  -Blood cultures ordered  -wound Cultures   -Started on IV vancomycin and cefepime  -podiatry consultation in the a.m.     #Mild DKA present on admission  #Type 1 diabetes:  Blood sugar in 400s, lactate anion gap 15, bicarb of 18, beta hydroxybutyrate 0.37  Given IV fluids, regular insulin 7 units  Repeat glucose 200, bicarb 23  We will continue with home dose Lantus 50 twice daily  SSI scale Accu-Cheks     #Hypokalemia  #Hypomagnesemia  -Magnesium 1.5, potassium 3.3  -Replaced  -Continue to monitor    #Prostatitis:  #BPH  -Has a Foley catheter in place due to enlarged prostate and incomplete bladder emptying   -Can remove catheter and have voiding trial while inpatient     DVT PPx: Medication VTE Prophylaxis Orders: enoxaparin  (LOVENOX) syringe 40 mg  Mechanical VTE Prophylaxis Orders: Mechanical VTE: Pneumatic Compression; Knee high  Dispo: Inpatient       Vitals: T:100 F (37.8 C) (Temporal),  BP:136/84, HR:(!) 108, RR:20, SaO2:98%    FSBS ACHS W/SS  NPO  MRI LEFT FOOT W/O  AM LABS  URINARY CATHETER  TELEMETRY  VS Q8H  SCD'S      Recent Labs   Lab 09/05/21  0310   WBC 9.9   Hemoglobin 10.6*   Hematocrit 34.4*   PLT CT 231        Recent Labs   Lab 09/05/21  0310   Sodium 136   Potassium 3.3*   Chloride 107   CO2 20   BUN 5*   Creatinine 0.65*   EGFR 124   Glucose 260*   Calcium 7.8*          Scheduled Meds:  Current Facility-Administered Medications   Medication Dose Route Frequency    aspirin  81 mg Oral Daily    cefepime  2 g Intravenous Q12H    colchicine  0.6 mg Oral Daily    enoxaparin  40 mg Subcutaneous Q24H    gabapentin  300 mg Oral TID    insulin glargine  50 Units Subcutaneous Q12H    insulin lispro (1 Unit Dial)  2-24 Units Subcutaneous TID AC    And    insulin lispro (1 Unit Dial)  1-12 Units Subcutaneous QHS    isosorbide mononitrate  60 mg Oral Daily at 0600    metoprolol succinate XL  50 mg Oral Daily    sodium  chloride (PF)  3 mL Intravenous Q12H [facility]    ticagrelor  90 mg Oral Q12H    vancomycin  1,500 mg Intravenous Q12H    vancomycin therapy placeholder   Does not apply See Admin Instructions     Continuous Infusions:  PRN Meds:.acetaminophen **OR** acetaminophen **OR** acetaminophen, dextrose, glucagon (rDNA), HYDROcodone-acetaminophen, NSG Communication: Glucose POCT order (AC, HS) **AND** insulin lispro (1 Unit Dial) **AND** insulin lispro (1 Unit Dial) **AND** insulin lispro (1 Unit Dial), morphine, naloxone          Leontine Locket, RN, BSN  [facility]  Utilization Review Management   [address]  [place] [address]    Direct ph.: [phone]  UR Dept. #: [phone]  UR fax #:  [phone]  [email or web address]

## 2021-09-05 NOTE — Anesthesia Preprocedure Evaluation (Signed)
Anesthesia Evaluation    AIRWAY    Mallampati: II    TM distance: >3 FB  Neck ROM: full  Mouth Opening:full   CARDIOVASCULAR    cardiovascular exam normal, regular and normal       DENTAL    no notable dental hx     PULMONARY    pulmonary exam normal and clear to auscultation     OTHER FINDINGS    Very poor dentition; multiple caries/broken teeth              Relevant Problems   OTHER   (+) Osteomyelitis of left foot               Anesthesia Plan    ASA 4     MAC                     intravenous induction           Post op pain management: per surgeon    informed consent obtained    Plan discussed with CRNA.    ECG reviewed  pertinent labs reviewed             Signed by: Adele Dan, MD 09/05/21 6:10 PM

## 2021-09-05 NOTE — Progress Notes (Signed)
Wound care- left foot wound on the dorsal and plantar sides- there is purulent drainage- his foot is edematous, red and painful. The redness extends towards the medial ankle.He will be going to the OR later this evening.

## 2021-09-05 NOTE — Brief Op Note (Signed)
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 due to underlying condition, unspecified part of foot, unspecified ulcer stage [E08.621, L97.529]     * Osteomyelitis of left foot, unspecified type [M86.9]    Postoperative Diagnosis:   Post-Op Diagnosis Codes:     * 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  Proximal tracking of the plantar wound to approximately 10 cm toward the anteromedial aspect of the ankle.  No gross purulence noted.  Destruction of the plantar fascia noted.    Complications:   None      Signed by: Jannett Celestine, DPM                                                                           [facility]

## 2021-09-05 NOTE — Transfer of Care (Signed)
Anesthesia Transfer of Care Note    Patient: Arthur Kim    Last vitals:   Vitals:    09/05/21 1908   BP: 94/52   Pulse: 86   Resp: 14   Temp: 36.6 C (97.9 F)   SpO2: 95%       Oxygen: Nasal Cannula     Mental Status:awake    Airway: Natural    Cardiovascular Status:  stable    Report to RN

## 2021-09-05 NOTE — Plan of Care (Addendum)
Patient:  Arthur Kim, Arthur Kim, 40 y.o. 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]  Cellulitis of left foot [L03.116]  Elevated beta-hydroxybutyrate [R78.89]  Osteomyelitis of left foot, unspecified type [M86.9]  Diabetic ulcer of left midfoot associated with type 2 diabetes mellitus, unspecified ulcer stage [E11.621, L97.429]  Type 2 diabetes mellitus with hyperglycemia, unspecified whether long term insulin use [E11.65]    Events (last 24 hrs/overnight):        Nursing Shift Summary:      1505 - MRI called regarding stat order placed at 1200.  MRI staff inquiring about screening form that was completed immediately following order being placed by MD.  MRI called  back to notify of transport being ordered.     Vitals:      BP: 114/71 (09/05/2021  7:23 AM)  Heart Rate: 95 (09/05/2021  7:23 AM)  Temp: 97.9 F (36.6 C) (09/05/2021  7:23 AM)  Resp Rate: 20 (09/05/2021  7:23 AM)   Oxygen Needs:    SpO2: 98 % (09/05/2021  7:23 AM)  O2 Device: None (Room air) (09/05/2021  7:23 AM)      Last 3 Weights:    Recent Weights for the past 720 hrs (Last 3 readings):   Weight   09/04/21 2131 81.6 kg (180 lb)       Tele:   Last order of VH TELEMETRY MONITORING was found on 09/05/2021 from Hospital Encounter on 09/04/2021         Cardiac Rhythm: Sinus Tach     Drips:         Current Facility-Administered Medications   Medication Dose Last Admin      I/O: Last 24 hours:    UOP:  Foley/Drain LDA(s):       Intake/Output Summary (Last 24 hours) at 09/05/2021 0918  Last data filed at 09/05/2021 0041  Gross per 24 hour   Intake 1100 ml   Output --   Net 1100 ml        Drain Output:  Urethral Catheter (Active)   Catheter necessity reviewed? Yes 09/05/21 0300    No order of  INSERT FOLEY CATHETER is found.     No order of EXTERNAL URINARY CATHETER is found.     If there is a catheter present (internal or external)   "Should we continue the catheter?"  []  Remove   []  Keep           Last BM: Last BM Date: 09/04/21       Unmeasured Output This Shift:    No data found.     VTE PPX:       Current Facility-Administered Medications (Includes Only Anticoagulants)   Medication Dose Route Frequency Provider Last Rate Last Admin    enoxaparin (LOVENOX) syringe 40 mg  40 mg Subcutaneous Q24H Alyacoub, Ramez I, MD           Skin Issues/Concerns:      Skin issues noted this shift:      Wound 09/05/21 Diabetic/ Neuropathy Foot Anterior;Left 4-35mm circular scabbed wound on top of foot (Active)   09/05/21 0524 Foot   Wound Type: Diabetic/ Neuropathy   Pressure Injury Staging (WOCN/ Trained RNs Only):    Wound Location Orientation: Anterior;Left   Wound Description (Comments): 4-59mm circular scabbed  wound on top of foot   Present on Admission: Yes   WOCN Wound Description (Comments):    Non-staged Wound Description:        Wound 09/05/21 Diabetic/ Neuropathy Heel Left 1.5-2 inch in diameter open reddened/pink wound to pad of foot (Active)   09/05/21 0525 Heel   Wound Type: Diabetic/ Neuropathy   Pressure Injury Staging (WOCN/ Trained RNs Only):    Wound Location Orientation: Left   Wound Description (Comments): 1.5-2 inch in diameter open reddened/pink wound to pad of foot   Present on Admission: Yes   WOCN Wound Description (Comments):    Non-staged Wound Description:          Chem Sticks (Hyper/Hypo)       Glucose POCT   Date Value Ref Range Status   09/05/2021 193 (H) 71 - 99 mg/dL Final     Comment:     The above 1 analytes were performed by [facility] [phone])  [address],[place] [address]         Mobility:        PMP Activity: Step 1 - Bedrest (09/05/2021  3:00 AM)      Falls Risk:   Risk Category: Low      PT/OT (recommendations):      No data recorded    Care Act  Designee to care for patient after discharge:      Designate Care Provider?: Refused (09/05/2021  2:00 AM)      Care Partner(s) who will help during hospital stay:      No data recorded    Patient Rounding, Team Members Present    [x]  Rounds Occurred   [x]  Patient    [x]  MD    []  CM    [x]  Primary RN    [x]  Charge Nurse    [x]  Pharmacist      ("In a few words, as a patient, do you have any goals or concerns you would like to share  with the team today?" )     Patient stated goals and/or concerns:          Action plan:                       Problem: Inadequate Cardiac Output  Goal: Adequate tissue perfusion will be maintained  Outcome: Progressing     Problem: Infection  Goal: Free from infection  Outcome: Progressing  Flowsheets (Taken 09/05/2021 0242 by Leighton Roach, RN)  Free from infection:   Assess for signs/symptoms of infection   Assess immunization status   Utilize isolation precautions per protocol/policy   Consult/collaborate with Infection Preventionist   Utilize sepsis protocol     Problem: Compromised skin integrity  Goal: Skin integrity is maintained or improved  Outcome: Progressing     Problem: Compromised Tissue integrity  Goal: Damaged tissue is healing and protected  Outcome: Progressing  Goal: Nutritional status is improving  Outcome: Progressing

## 2021-09-05 NOTE — Consults (Signed)
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    Plan:   Consult complete.  Thank you.  Will order STAT MRI to evaluate for deep space abscess and deep gas gangrene left foot.  TO OR later tonight for I and D left foot and resection of 4th metatarsal head left foot.      History:   Arthur Kim is a 40 y.o. male who presents to the hospital on 09/04/2021 with a limb threatening diabetic foot infection.  He notes pain in his left foot of about 4-5 months duration.  He relates a surgery previously to drain an infection in his foot.  He notes severe pain when he walks on his foot.  He has had fever and chills recently.       Past Medical History:     Past Medical History:   Diagnosis Date    Acute renal failure (ARF)     CAD (coronary artery disease)     CVA (cerebral vascular accident)     Diabetes mellitus type 1     Essential hypertension     Hx of cardiac cath     Unstable angina        Past Surgical History:     Past Surgical History:   Procedure Laterality Date    ABDOMINAL SURGERY      APPENDECTOMY (OPEN)      CARDIAC CATHETERIZATION  04/12/2019    CARDIAC CATHETERIZATION  03/16/2019    CHOLECYSTECTOMY         Family History:   History reviewed. No pertinent family history.    Social History:     Social History     Socioeconomic History    Marital status: Single     Spouse name: Not on file    Number of children: Not on file    Years of education: Not on file    Highest education level: Not on file   Occupational History    Not on file   Tobacco Use    Smoking status: Unknown    Smokeless tobacco: Current     Types: Chew   Vaping Use     Vaping Use: Never used   Substance and Sexual Activity    Alcohol use: Never    Drug use: Never    Sexual activity: Not on file   Other Topics Concern    Not on file   Social History Narrative    Not on file     Social Determinants of Health     Financial Resource Strain: Not on file   Food Insecurity: Not on file   Transportation Needs: Not on file   Physical Activity: Not on file   Stress: Not on file   Social Connections: Not on file   Intimate Partner Violence: Not on file   Housing Stability: Not on file       Allergies:   No Known Allergies    Medications:     Current Facility-Administered Medications   Medication Dose Route Frequency    aspirin  81 mg Oral Daily  cefepime  2 g Intravenous Q12H    colchicine  0.6 mg Oral Daily    enoxaparin  40 mg Subcutaneous Q24H    gabapentin  300 mg Oral TID    insulin glargine  50 Units Subcutaneous Q12H    insulin lispro (1 Unit Dial)  2-24 Units Subcutaneous TID AC    And    insulin lispro (1 Unit Dial)  1-12 Units Subcutaneous QHS    isosorbide mononitrate  60 mg Oral Daily at 0600    metoprolol succinate XL  50 mg Oral Daily    sodium chloride (PF)  3 mL Intravenous Q12H [facility]    ticagrelor  90 mg Oral Q12H    vancomycin  1,500 mg Intravenous Q12H    vancomycin therapy placeholder   Does not apply See Admin Instructions       Review of Systems:   A comprehensive review of systems was: History obtained from the patient  Endocrine ROS: negative  Respiratory ROS: negative  Cardiovascular ROS: negative  Gastrointestinal ROS: negative  Musculoskeletal ROS: negative  Neurological ROS: negative  Dermatological ROS: negative    Physical Exam:     Vitals:    09/05/21 1127   BP: 108/65   Pulse: 93   Resp: 20   Temp: 98.4 F (36.9 C)   SpO2: 97%       Intake and Output Summary (Last 24 hours) at Date Time    Intake/Output Summary (Last 24 hours) at 09/05/2021 1207  Last data filed at 09/05/2021 1113  Gross per 24 hour   Intake 1100 ml   Output 600 ml   Net 500 ml       General  appearance - alert, well appearing, and in no distress  Musculoskeletal - no joint tenderness, deformity or swelling  Extremities - peripheral pulses normal, no pedal edema, no clubbing or cyanosis  Erythema and edema noted to the entire left foot.  There is a necrotic eschar plantar central aspect of the left foot with proximal erythema and pain.  There is also an eschar over the 4th metatarsal head.No active dranage noted today.      Labs Reviewed:     Results       Procedure Component Value Units Date/Time    Dextrose Stick Glucose [[id number]]  (Abnormal) Collected: 09/05/21 1127    Specimen: Blood Updated: 09/05/21 1128     Glucose POCT 151 mg/dL     MRSA Culture [[id number]] Collected: 09/05/21 0812    Specimen: Nares Updated: 09/05/21 0828    Wound Culture and Smear [[id number]] Collected: 09/05/21 0812    Specimen: Wound from Left Foot Updated: 09/05/21 0817    Dextrose Stick Glucose [[id number]]  (Abnormal) Collected: 09/05/21 0723    Specimen: Blood Updated: 09/05/21 0724     Glucose POCT 193 mg/dL     Collect Blood [[id number]] Collected: 09/04/21 2153    Specimen: Other Updated: 09/05/21 0451     Collect Blood Label Notification    Dextrose Stick Glucose [[id number]]  (Abnormal) Collected: 09/05/21 0413    Specimen: Blood Updated: 09/05/21 0421     Glucose POCT 295 mg/dL     Comprehensive metabolic panel [[id number]]  (Abnormal) Collected: 09/05/21 0310    Specimen: Plasma Updated: 09/05/21 0412     Sodium 136 mMol/L      Potassium 3.3 mMol/L      Chloride 107 mMol/L      CO2 20 mMol/L      Calcium 7.8 mg/dL  Glucose 260 mg/dL      Creatinine 1.61 mg/dL      BUN 5 mg/dL      Protein, Total 6.3 gm/dL      Albumin 2.6 gm/dL      Alkaline Phosphatase 188 U/L      ALT 20 U/L      AST (SGOT) 23 U/L      Bilirubin, Total 0.4 mg/dL      Albumin/Globulin Ratio 0.70 Ratio      Anion Gap 12.3 mMol/L      BUN / Creatinine Ratio 7.7 Ratio      EGFR 124 mL/min/1.74m2      Osmolality Calculated 278 mOsm/kg      Globulin  3.7 gm/dL     Magnesium [[id number]] Collected: 09/05/21 0310    Specimen: Plasma Updated: 09/05/21 0412     Magnesium 2.0 mg/dL     Hemoglobin W1X [[id number]] Collected: 09/05/21 0310    Specimen: Blood Updated: 09/05/21 0356     Hgb A1C, % 8.7 %      Estimated Average Glucose 203 mg/dL     CBC and differential [[id number]]  (Abnormal) Collected: 09/05/21 0310    Specimen: Blood Updated: 09/05/21 0343     WBC 9.9 K/cmm      RBC 4.02 M/cmm      Hemoglobin 10.6 gm/dL      Hematocrit 46.9 %      MCV 86 fL      MCH 26 pg      MCHC 31 gm/dL      RDW 62.9 %      PLT CT 231 K/cmm      MPV 8.8 fL      Neutrophils % 73.9 %      Lymphocytes 13.3 %      Monocytes 9.2 %      Eosinophils % 3.2 %      Basophils % 0.4 %      Neutrophils Absolute 7.4 K/cmm      Lymphocytes Absolute 1.3 K/cmm      Monocytes Absolute 0.9 K/cmm      Eosinophils Absolute 0.3 K/cmm      Basophils Absolute 0.0 K/cmm     Blood Culture - Venipuncture [[id number]] Collected: 09/04/21 2344    Specimen: Blood from Venipuncture Updated: 09/05/21 0007     Culture Result --     Blood culture volume for this collection was less than the optimal 5 ml needed to achieve adequate sensitivity.  Culture In Progress      Blood Culture - Venipuncture [[id number]] Collected: 09/04/21 2215    Specimen: Blood from Venipuncture Updated: 09/05/21 0007     Culture Result Culture In Progress    i-Stat Lactic AcID [[id number]] Collected: 09/04/21 2345    Specimen: Venipuncture Updated: 09/04/21 2349     Room Number I-Stat 11     Sample I-Stat Venous     Site I-Stat R Radial     Lactic Acid I-Stat 1.9 mMol/L     i-Stat Chem 8 CartrIDge [[id number]]  (Abnormal) Collected: 09/04/21 2345    Specimen: Blood Updated: 09/04/21 2348     Sodium I-Stat 137 mMol/L      Potassium I-Stat 3.3 mMol/L      Chloride I-Stat 104 mMol/L      TCO2 I-Stat 23 mMol/L      Calcium Ionized I-Stat 4.40 mg/dL      Glucose I-Stat 433 mg/dL      Creatinine I-Stat 0.50 mg/dL  BUN I-Stat 4 mg/dL      Anion Gap  I-Stat 14.0     EGFR 134 mL/min/1.16m2      Hematocrit I-Stat 31.0 %      Hemoglobin I-Stat 10.5 gm/dL     Chem 8 plus only (i-STAT) [[id number]] Collected: 09/04/21 2338    Specimen: ISTAT Updated: 09/04/21 2341     I-STAT Notification Istat Notification    Lactic Acid I-Stat [[id number]] Collected: 09/04/21 2338    Specimen: ISTAT Updated: 09/04/21 2341     I-STAT Notification Istat Notification    CBC and differential [[id number]]  (Abnormal) Collected: 09/04/21 2215    Specimen: Blood Updated: 09/04/21 2257     WBC 11.5 K/cmm      RBC 4.01 M/cmm      Hemoglobin 11.0 gm/dL      Hematocrit 08.6 %      MCV 83 fL      MCH 28 pg      MCHC 33 gm/dL      RDW 57.8 %      PLT CT 259 K/cmm      MPV 9.1 fL      Neutrophils % 75.7 %      Lymphocytes 12.7 %      Monocytes 8.8 %      Eosinophils % 2.4 %      Basophils % 0.4 %      Neutrophils Absolute 8.7 K/cmm      Lymphocytes Absolute 1.5 K/cmm      Monocytes Absolute 1.0 K/cmm      Eosinophils Absolute 0.3 K/cmm      Basophils Absolute 0.1 K/cmm     Comprehensive metabolic panel [[id number]]  (Abnormal) Collected: 09/04/21 2215    Specimen: Plasma Updated: 09/04/21 2254     Sodium 134 mMol/L      Potassium 3.3 mMol/L      Chloride 104 mMol/L      CO2 18 mMol/L      Calcium 8.7 mg/dL      Glucose 413 mg/dL      Creatinine 2.44 mg/dL      BUN 6 mg/dL      Protein, Total 6.9 gm/dL      Albumin 3.0 gm/dL      Alkaline Phosphatase 160 U/L      ALT 13 U/L      AST (SGOT) 7 U/L      Bilirubin, Total 0.5 mg/dL      Albumin/Globulin Ratio 0.77 Ratio      Anion Gap 15.3 mMol/L      BUN / Creatinine Ratio 7.2 Ratio      EGFR 115 mL/min/1.70m2      Osmolality Calculated 284 mOsm/kg      Globulin 3.9 gm/dL     C Reactive Protein [[id number]]  (Abnormal) Collected: 09/04/21 2215    Specimen: Plasma Updated: 09/04/21 2253     C-Reactive Protein 16.70 mg/dL     Lipase [[id number]] Collected: 09/04/21 2215    Specimen: Plasma Updated: 09/04/21 2253     Lipase 8 U/L     Beta-Hydroxybutyrate  [[id number]]  (Abnormal) Collected: 09/04/21 2215    Specimen: Plasma Updated: 09/04/21 2253     Betahydroxybutyrate 0.37 mMol/L     Sedimentation rate (ESR) [[id number]]  (Abnormal) Collected: 09/04/21 2215    Specimen: Blood Updated: 09/04/21 2253     Sed Rate 54 mm/hr     Magnesium [[id number]]  (Abnormal) Collected: 09/04/21 2215    Specimen: Plasma Updated: 09/04/21 2251  Magnesium 1.5 mg/dL     Prothrombin time/INR [[id number]] Collected: 09/04/21 2215    Specimen: Blood Updated: 09/04/21 2251     PT 10.7 sec      PT INR 1.0    Ethanol (Alcohol) Level [[id number]] Collected: 09/04/21 2215    Specimen: Plasma Updated: 09/04/21 2246     Alcohol <10 mg/dL     Urinalysis w Microscopic and Culture if Indicated [[id number]]  (Abnormal) Collected: 09/04/21 2215    Specimen: Urine, Random Updated: 09/04/21 2244     Color, UA Yellow     Clarity, UA Clear     Urine Specific Gravity 1.010     pH, Urine 6.0 pH      Protein, UR Trace mg/dL      Glucose, UA >=0865 mg/dL      Ketones UA Trace     Bilirubin, UA Negative mg/dL      Blood, UA Moderate mg/dL      Nitrite, UA Negative     Urobilinogen, UA 0.2 mg/dL      Leukocyte Esterase, UA Negative Leu/uL      UR Micro Performed     WBC, UA 4 /hpf      RBC, UA 6 /hpf      Squam Epithel, UA <1 /hpf     i-Stat Lactic AcID [[id number]]  (Abnormal) Collected: 09/04/21 2229    Specimen: Venipuncture Updated: 09/04/21 2237     Room Number I-Stat 11     Sample I-Stat Venous     Site I-Stat R Radial     Lactic Acid I-Stat 2.5 mMol/L     i-Stat G3 Venous CartrIDge [[id number]]  (Abnormal) Collected: 09/04/21 2229    Specimen: Venipuncture Updated: 09/04/21 2232     pH, ISTAT 7.57     PO2, ISTAT 45 mm Hg      BE, ISTAT -1 mMol/L      HCO3, ISTAT 19.3 mMol/L      PCO2, ISTAT 21.3 mm Hg      O2 Sat, %, ISTAT 88 %      Room Number I-Stat 11     i-STAT Allen's Test N/A     DELS, ISTAT Room Air     i-STAT FIO2 0.00 %      Sample I-Stat Venous     Site I-Stat R Radial     TCO2 I-Stat 20 mMol/L       Operator, ISTAT Operator: [id number] Nita Sickle ED POCT    Lactic Acid I-Stat [[id number]] Collected: 09/04/21 2215    Specimen: ISTAT Updated: 09/04/21 2225     I-STAT Notification Istat Notification    Venous Blood Gas (VBG) (i-STAT) [[id number]] Collected: 09/04/21 2215    Specimen: ISTAT Updated: 09/04/21 2225     I-STAT Notification Istat Notification    Urine Drug Screen - No Confirmation [[id number]] Collected: 09/04/21 2215    Specimen: Urine, Random Updated: 09/04/21 2215    Dextrose Stick Glucose [[id number]]  (Abnormal) Collected: 09/04/21 2130    Specimen: Blood Updated: 09/04/21 2132     Glucose POCT 416 mg/dL             Recent CBC   Recent Labs     09/05/21  0310   RBC 4.02   Hemoglobin 10.6*   Hematocrit 34.4*   MCV 86   MCH 26*   MCHC 31*   RDW 16.5*   MPV 8.8     Recent BMP   Recent Labs     09/05/21  0310  Glucose 260*   BUN 5*   Creatinine 0.65*   Calcium 7.8*   Sodium 136   Potassium 3.3*   Chloride 107   CO2 20       Rads:   Radiological Procedure reviewed.   STAT MRI w/ and W/o contrast pending.    Signed by: Jannett Celestine, DPM

## 2021-09-05 NOTE — ED Notes (Signed)
[facility] EMERGENCY DEPARTMENT  ED NURSING NOTE FOR THE RECEIVING INPATIENT NURSE   ED NURSE PHONE: [phone]    ADMISSION INFORMATION   Arthur Kim is a 40 y.o. male admitted with an ED diagnosis of:  1. Osteomyelitis of left foot, unspecified type    2. Lactic acidosis    3. Type 2 diabetes mellitus with hyperglycemia, unspecified whether long term insulin use    4. Cellulitis of left foot    5. Diabetic ulcer of left midfoot associated with type 2 diabetes mellitus, unspecified ulcer stage    6. Sinus tachycardia    7. SIRS (systemic inflammatory response syndrome)    8. Elevated beta-hydroxybutyrate    9. Elevated sedimentation rate    10. Elevated C-reactive protein (CRP)    11. Hypomagnesemia    12. Hypokalemia    13. Proctitis      ED Pre-Departure Assessment:  Patient has osteomyelitis of his L foot, has had an ulcer since October. Recieved Klor Con , IV potassium chloride , NS bolus, and IV mag. Will start vancocin before patient comes up. Vital signs updated.    NURSING CARE   Isolation None   Home O2 No   Patient Comes From: Home Independent   Documents accompanying patient: not applicable   Mental Status: alert and oriented   Ambulation: 1 person assist   Pertinent Info/Safety Concern: None   Report called to receiving RN?: No

## 2021-09-06 LAB — CBC AND DIFFERENTIAL
Basophils %: 0.1 % (ref 0.0–3.0)
Basophils Absolute: 0 10*3/uL (ref 0.0–0.3)
Eosinophils %: 4.1 % (ref 0.0–7.0)
Eosinophils Absolute: 0.3 10*3/uL (ref 0.0–0.8)
Hematocrit: 30.3 % — ABNORMAL LOW (ref 39.0–52.5)
Hemoglobin: 9.7 gm/dL — ABNORMAL LOW (ref 13.0–17.5)
Lymphocytes Absolute: 1 10*3/uL (ref 0.6–5.1)
Lymphocytes: 12.7 % — ABNORMAL LOW (ref 15.0–46.0)
MCH: 27 pg — ABNORMAL LOW (ref 28–35)
MCHC: 32 gm/dL (ref 32–36)
MCV: 85 fL (ref 80–100)
MPV: 8.5 fL (ref 6.0–10.0)
Monocytes Absolute: 0.8 10*3/uL (ref 0.1–1.7)
Monocytes: 10 % (ref 3.0–15.0)
Neutrophils %: 73.1 % (ref 42.0–78.0)
Neutrophils Absolute: 6 10*3/uL (ref 1.7–8.6)
PLT CT: 265 10*3/uL (ref 130–440)
RBC: 3.59 10*6/uL — ABNORMAL LOW (ref 4.00–5.70)
RDW: 16.3 % — ABNORMAL HIGH (ref 11.0–14.0)
WBC: 8.2 10*3/uL (ref 4.0–11.0)

## 2021-09-06 LAB — COMPREHENSIVE METABOLIC PANEL
ALT: 13 U/L (ref 0–55)
AST (SGOT): 9 U/L — ABNORMAL LOW (ref 10–42)
Albumin/Globulin Ratio: 0.69 Ratio — ABNORMAL LOW (ref 0.80–2.00)
Albumin: 2.5 gm/dL — ABNORMAL LOW (ref 3.5–5.0)
Alkaline Phosphatase: 169 U/L — ABNORMAL HIGH (ref 40–145)
Anion Gap: 11.4 mMol/L (ref 7.0–18.0)
BUN / Creatinine Ratio: 9.6 Ratio — ABNORMAL LOW (ref 10.0–30.0)
BUN: 7 mg/dL (ref 7–22)
Bilirubin, Total: 0.2 mg/dL (ref 0.1–1.2)
CO2: 22 mMol/L (ref 20–30)
Calcium: 8.3 mg/dL — ABNORMAL LOW (ref 8.5–10.5)
Chloride: 111 mMol/L — ABNORMAL HIGH (ref 98–110)
Creatinine: 0.73 mg/dL — ABNORMAL LOW (ref 0.80–1.30)
EGFR: 119 mL/min/{1.73_m2} (ref 60–150)
Globulin: 3.6 gm/dL (ref 2.0–4.0)
Glucose: 181 mg/dL — ABNORMAL HIGH (ref 71–99)
Osmolality Calculated: 284 mOsm/kg (ref 275–300)
Potassium: 3.4 mMol/L — ABNORMAL LOW (ref 3.5–5.3)
Protein, Total: 6.1 gm/dL (ref 6.0–8.3)
Sodium: 141 mMol/L (ref 136–147)

## 2021-09-06 LAB — VH DEXTROSE STICK GLUCOSE
Glucose POCT: 199 mg/dL — ABNORMAL HIGH (ref 71–99)
Glucose POCT: 157 mg/dL — ABNORMAL HIGH (ref 71–99)
Glucose POCT: 196 mg/dL — ABNORMAL HIGH (ref 71–99)
Glucose POCT: 103 mg/dL — ABNORMAL HIGH (ref 71–99)
Glucose POCT: 154 mg/dL — ABNORMAL HIGH (ref 71–99)

## 2021-09-06 LAB — VH CULTURE AND SMEAR, TISSUE

## 2021-09-06 LAB — VH CULTURE MRSA

## 2021-09-06 LAB — VANCOMYCIN, TROUGH: Vancomycin Trough: 12.3 ug/mL (ref 10.0–20.0)

## 2021-09-06 LAB — MAGNESIUM: Magnesium: 1.9 mg/dL (ref 1.6–2.6)

## 2021-09-06 LAB — VH CULTURE AND SMEAR, WOUND

## 2021-09-06 MED ORDER — KETOROLAC TROMETHAMINE 30 MG/ML IJ SOLN
30.0000 mg | Freq: Once | INTRAMUSCULAR | Status: AC
Start: 2021-09-06 — End: 2021-09-06
  Administered 2021-09-06: 30 mg via INTRAVENOUS
  Filled 2021-09-06: qty 1

## 2021-09-06 MED ORDER — VH POTASSIUM CHLORIDE CRYS ER 20 MEQ PO TBCR (WRAP)
40.0000 meq | EXTENDED_RELEASE_TABLET | Freq: Once | ORAL | Status: AC
Start: 2021-09-06 — End: 2021-09-06
  Administered 2021-09-06: 40 meq via ORAL
  Filled 2021-09-06: qty 2

## 2021-09-06 NOTE — Progress Notes (Signed)
Pharmacy Vancomycin Dosing Consult Note  Arthur Kim    Assessment:   Indication: osteomyelitis, diabetes, SIRS  Day #2 vancomycin + cefepime for this 40 yo M who presented with worsening left foot pain. PMH includes prostatitis with indwelling foley catheter, DM, DKA, and recurring diabetic foot ulcerations. Recent hospitalization over a month ago for this infection and discharged on doxycycline and bactrim with no improvement.   Tmax 100.2 F, WBC 8.2, Scr 0.73. MRSA nares (+). L foot wound and tissue Cx 12/16 MRSA. 12/15 Bcx NGTD.      Plan:   Continue vancomycin 1500 mg IV Q12  Vancomycin Pharmacokinetic target: AUC24 (range) 400-600 mg/L/hr  Serum creatinine daily  Trough level at 0800 on 12/17 = 12.3.   MRSA nares (+)  Pharmacy will follow the patient's renal function, vancomycin levels, and dosing during the course of therapy. If you have any questions, please contact the pharmacist at [phone].      Age: 40 y.o.  Height: 1.753 m (5\' 9" )  Weight:  81.6 kg (180 lb)  IBW: 70.7 kg  DW: 75.1 kg     Baseline Population Estimated Kinetics: Individual Kinetics (Once Levels Resulted):   SCr: 0.73 mg/dL    CrCl: >045 ml/min    Vancomycin Clearance: 5.38 L/hr    Volume: 68.1 L  For patients with BMI >40, [facility] will assume a standard Volume of     t50 (half-life): 9.53 hours SCr:  mg/dL    CrCl:  ml/min    Vancomycin Clearance:  L/hr    Volume:  L  For patients with BMI >40, [facility] will assume a standard Volume of 25 L    t50 (half-life):  hours     Expected AUC24 - steady state, Trough - steady state, and Risk of nephrotoxicity  Goal: AUC24,ss: 400 - 600 mg/L/hr  Goal: Probability of AUC24 > 400: Greater than 70%  Goal: Probability of nephrotoxicity: Less than 20%     12/17  Regimen: 1500 mg IV every 12 hours  Start time: 14:43 on 09/06/2021  Exposure target: AUC24 (range)400-600 mg/L.hr   AUC24,ss: 488 mg/L.hr  Probability of AUC24 > 400: 85 %  Ctrough,ss: 13.5 mg/L  Probability of Ctrough,ss > 20: 9  %  Probability of nephrotoxicity (Lodise CID 2009): 9 %         Historic Patient Regimen   Date Regimen Weight SCr CrCl Trough Level               Recent Labs   Lab 09/06/21  0330 09/05/21  0310 09/04/21  2345 09/04/21  2215   Creatinine 0.73* 0.65*  --  0.83   Creatinine I-Stat  --   --  0.50*  --    BUN 7 5*  --  6*   WBC 8.2 9.9  --  11.5*     Temp (24hrs), Avg:99.2 F (37.3 C), Min:97.9 F (36.6 C), Max:100.2 F (37.9 C)    Patient Vitals for the past 12 hrs:   BP Temp Pulse Resp   09/06/21 1119 125/71 99.3 F (37.4 C) 99 (!) 26   09/06/21 0708 124/78 99 F (37.2 C) (!) 103 20   09/06/21 0423 124/70 100.2 F (37.9 C) (!) 111 18        Microbiology Results (last 15 days)       Procedure Component Value Units Date/Time    Tissue Culture and Smear [[id number]]  (Abnormal) Collected: 09/05/21 1853    Order Status: Completed Specimen:  Tissue from Left Foot Updated: 09/06/21 1214     Gram Stain --     No Epithelial Cells  Many WBC's Seen  Many Gram Positive Cocci  Moderate Gram Negative Rods  Occasional Gram Positive Rods       Isolate 1 Many  **Methicillin Resistant Staphylococcus aureus**  **MRSA isolated.**  Results called and read back by (licensed clinician/date/time/tech):  RN Caralyn Guile 12.17.22 1214 [id number]      Anaerobic Culture [[id number]] Collected: 09/05/21 1853    Order Status: Completed Specimen: Tissue from Left Foot Updated: 09/06/21 0634     Culture Result No Anaerobes Isolated - Day 1, Reincubate    Wound Culture and Smear [[id number]]  (Abnormal) Collected: 09/05/21 0812    Order Status: Completed Specimen: Wound from Left Foot Updated: 09/06/21 1216     Gram Stain --     Few Epithelial Cells  Occasional WBC's Seen  No Organisms Seen       Isolate 1 Few  **Methicillin Resistant Staphylococcus aureus**  **MRSA isolated.**  Results called and read back by (licensed clinician/date/time/tech):  RN JUNG LEE 12.17.22 1214 [id number]  Sensitivity To Follow      MRSA Culture [[id number]]  (Abnormal) Collected: 09/05/21  0812    Order Status: Completed Specimen: Nares Updated: 09/06/21 1217     Isolate 1 Few  **Methicillin Resistant Staphylococcus aureus**  **MRSA isolated.**  Results called and read back by (licensed clinician/date/time/tech):  RN JUNG LEE 12.17.22 1214 [id number]      Blood Culture - Venipuncture [[id number]] Collected: 09/04/21 2344    Order Status: Completed Specimen: Blood from Venipuncture Updated: 09/06/21 1334     Culture Result --     Blood culture volume for this collection was less than the optimal 5 ml needed to achieve adequate sensitivity.  No Growth To Date      Blood Culture - Venipuncture [[id number]] Collected: 09/04/21 2215    Order Status: Completed Specimen: Blood from Venipuncture Updated: 09/06/21 1334     Culture Result No Growth To Date

## 2021-09-06 NOTE — Plan of Care (Signed)
Patient:  Arthur Kim, Arthur Kim, 40 y.o. 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]  Cellulitis of left foot [L03.116]  Elevated beta-hydroxybutyrate [R78.89]  Osteomyelitis of left foot, unspecified type [M86.9]  Diabetic ulcer of left midfoot associated with type 2 diabetes mellitus, unspecified ulcer stage [E11.621, L97.429]  Type 2 diabetes mellitus with hyperglycemia, unspecified whether long term insulin use [E11.65]    Events (last 24 hrs/overnight):        Nursing Shift Summary:      Pt is A&Ox4, presents with pleasant affect and participates in PoC. Norco was not effective in managing Pt's pain-Pt was provided with 1 time dose of Ketorolac with vo given by Dr. Oswaldo Done: s/p, Pt is asleep in bed in what appears comfortable. Rounding has also been completed with Dr. Robin Searing with PoC being provided to the Pt. Safety precautions remains in place with Pt's bed locked and placed in the lowest position with three side rails up. Call bell is within reach of the patient and the bed alarm is on. Patient stated "can't take it anymore--about to leave the hospital to the [facility] due to the pain/discomfort." Education has been reinforced regarding PoC such as Dr. Oswaldo Done planning to remove the Pt's dressings tomorrow, and that the Pt was being treated with antibiotics. PRN Morphine has been administered with Pt stating it was effective, and stating thanks.   Vitals:      BP: 125/71 (09/06/2021 11:19 AM)  Heart Rate: 99 (09/06/2021 11:19 AM)  Temp: 99.3 F (37.4 C) (09/06/2021 11:19 AM)  Resp Rate: (!) 26 (09/06/2021 11:19 AM)   Oxygen Needs:    SpO2: 97 % (09/06/2021 11:19 AM)  O2 Device: None (Room air) (09/06/2021 11:19 AM)      Last 3 Weights:    Recent  Weights for the past 720 hrs (Last 3 readings):   Weight   09/04/21 2131 81.6 kg (180 lb)       Tele:   Last order of [facility] TELEMETRY MONITORING was found on 09/05/2021 from Hospital Encounter on 09/04/2021         Cardiac Rhythm: Normal Sinus Rhythm; Sinus Tach     Drips:         Current Facility-Administered Medications   Medication Dose Last Admin      I/O: Last 24 hours:    UOP:  Foley/Drain LDA(s):       Intake/Output Summary (Last 24 hours) at 09/06/2021 1405  Last data filed at 09/06/2021 1200  Gross per 24 hour   Intake 1020 ml   Output 253 ml   Net 767 ml        Drain Output:  Urethral Catheter (Active)   Catheter necessity reviewed? Yes 09/06/21 0804   Urine Output (mL) Total 250 mL 09/06/21 0804    No order of INSERT FOLEY CATHETER is found.     No order of EXTERNAL URINARY CATHETER is found.     If there is a catheter present (internal or external)   "Should we continue the catheter?"  []  Remove   []  Keep           Last BM: Last BM Date: 09/04/21       Unmeasured Output This Shift:    No data found.     VTE  PPX:       Current Facility-Administered Medications (Includes Only Anticoagulants)   Medication Dose Route Frequency Provider Last Rate Last Admin    enoxaparin (LOVENOX) syringe 40 mg  40 mg Subcutaneous Q24H Alyacoub, Ramez I, MD           Skin Issues/Concerns:      Skin issues noted this shift:      Wound 09/05/21 Diabetic/ Neuropathy Foot Anterior;Left 4-72mm circular scabbed wound on top of foot (Active)   09/05/21 0524 Foot   Wound Type: Diabetic/ Neuropathy   Pressure Injury Staging (WOCN/ Trained RNs Only):    Wound Location Orientation: Anterior;Left   Wound Description (Comments): 4-68mm circular scabbed wound on top of foot   Present on Admission: Yes   WOCN Wound Description (Comments):    Non-staged Wound Description:    Site Description Dressing covering site (UTA) 09/06/21 0804   Peri-wound Description Clean;Dry;Intact 09/06/21 0804       Wound 09/05/21 Diabetic/ Neuropathy Heel Left 1.5-2  inch in diameter open reddened/pink wound to pad of foot (Active)   09/05/21 0525 Heel   Wound Type: Diabetic/ Neuropathy   Pressure Injury Staging (WOCN/ Trained RNs Only):    Wound Location Orientation: Left   Wound Description (Comments): 1.5-2 inch in diameter open reddened/pink wound to pad of foot   Present on Admission: Yes   WOCN Wound Description (Comments):    Non-staged Wound Description:    Site Description Dressing covering site (UTA) 09/06/21 0804       Wound 09/05/21 Surgical Incision Foot Left 4x4 gauze soaked in saline, kling and ace bandage, clean and dry (Active)   09/05/21 1855 Foot   Wound Type: Surgical Incision   Pressure Injury Staging (WOCN/ Trained RNs Only):    Wound Location Orientation: Left   Wound Description (Comments): 4x4 gauze soaked in saline, kling and ace bandage, clean and dry   Present on Admission:    WOCN Wound Description (Comments):    Non-staged Wound Description:    Site Description Dressing covering site (UTA) 09/06/21 0804   Closure Approximated 09/05/21 1900   Dressing Gauze Bandage Roll;Ace Wrap 09/05/21 1900   Dressing Changed New 09/05/21 1900   Dressing Status Clean;Dry;Intact 09/05/21 2220         Chem Sticks (Hyper/Hypo)       Glucose POCT   Date Value Ref Range Status   09/06/2021 196 (H) 71 - 99 mg/dL Final     Comment:     The above 1 analytes were performed by [facility] [phone])  [address],[place] [address]         Mobility:        PMP Activity: Step 4 - Dangle at Bedside (09/06/2021  8:04 AM)      Falls Risk:   Risk Category: High      PT/OT (recommendations):      No data recorded    Care Act Designee to care for patient after discharge:      [facility] Provider?: Refused (09/05/2021  2:00 AM)      Care Partner(s) who will help during hospital stay:      No data recorded    Patient Rounding, Team Members Present    []  Rounds Occurred   []  Patient    []  MD    []  CM    []  Primary RN    []  Charge Nurse    []  Pharmacist       ("In a  few words, as a patient, do you have any goals or concerns you would like to share  with the team today?" )     Patient stated goals and/or concerns:          Action plan:                     Problem: Inadequate Cardiac Output  Goal: Adequate tissue perfusion will be maintained  Outcome: Progressing     Problem: Infection  Goal: Free from infection  Outcome: Progressing     Problem: Compromised skin integrity  Goal: Skin integrity is maintained or improved  Outcome: Progressing     Problem: Compromised Tissue integrity  Goal: Damaged tissue is healing and protected  Outcome: Progressing  Goal: Nutritional status is improving  Outcome: Progressing     Problem: Moderate/High Fall Risk Score >5  Goal: Patient will remain free of falls  Outcome: Progressing     Problem: Diabetes: Glucose Imbalance  Goal: Blood glucose stable at established goal  Outcome: Progressing

## 2021-09-06 NOTE — Progress Notes (Signed)
Medicine Progress Note   [facility]  [employer]   Patient Name: Arthur Kim, Arthur Kim LOS: 1 days   Attending Physician: Claiborne Rigg, DO PCP: Pcp, None, MD      Hospital Course:                                                            Arthur Kim is a 40 y.o. male patient with past medical history of prostatitis with indwelling Foley catheter, diabetes, diabetic ketoacidosis, diabetic foot ulcerations presented with worsening left foot pain . He states He has been with having diabetic ulcer with recurrent infections requiring greater than a month hospitalization currently on doxycycline and Bactrim. 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      In the ED he was medically stable afebrile remarkable for leukocytosis, elevated CRP ESR, elevated lactic acid 2.5 x-ray left foot showed possible osteomyelitis of the left fourth toe patient be admitted for further management.    Podiatry consulted.  Underwent left foot I&D on 12/16.     Assessment and Plan:      #Diabetic ulcer of the left foot with cellulitis:  #Possible osteomyelitis of the fourth left toe  #Possible septic arthritis of the left fourth MTP joint  #Lactic acidosis  -hemodynamically stable afebrile  -Xray There is periostitis of the fourth metatarsal with suggestion of osteomyelitis of the fourth metatarsal head and possibly base of the fourth proximal phalanx. I suspect that there is septic arthritis of the fourth MTP joint.  -MRI left foot showed Diabetic left foot infection. Small deep plantar soft tissue ulcer extending to the bone in the left forefoot with acute septic arthritis of the left  fourth MTP joint and acute osteomyelitis with bone destruction of the adjacent proximal phalanx of the left fourth toe and distal fourth metatarsal bone. Reactive bone marrow edema or acute osteomyelitis without bone destruction of the heads of the left second and fourth metatarsals.  -Blood cultures negative to date  -OR cultures growing MRSA  -continue vancomycin and cefepime  -pain control with morphine IV prn + norco prn  -may need further surgical intervention  -will need ID consult as course progresses     #Mild DKA present on admission  #Type 1 diabetes:  -hba1c: 8.7  -We will continue with home dose Lantus 50 twice daily  -SSI scale Accu-Cheks     #Hypokalemia  #Hypomagnesemia  -replete K  -repeat BMP in AM     #History of coronary disease status post PCI and stent  -Continue aspirin and Brilinta  -continue Toprol XL     #History of hypertension  -Continue Toprol XL and Imdur    #Prostatitis:  #BPH  -Has a Foley catheter in place due to enlarged prostate and incomplete bladder emptying   -will need outpatient urology follow up    Disposition: Inpatient  DVT PPX: Medication VTE Prophylaxis Orders: enoxaparin (LOVENOX) syringe 40 mg  Mechanical VTE Prophylaxis Orders: Mechanical VTE: Pneumatic Compression; Knee high  Code:  Full Code       Subjective     Patient seen and examined.  No overnight events.  Reports left foot pain.    Objective   Physical Exam:     Vitals: T:99 F (37.2 C) (Temporal), BP:124/78, HR:(!) 103, RR:20, SaO2:96%    General: Patient is awake. In no acute distress.  HEENT: No conjunctival drainage, vision is intact, anicteric sclera.  Neck: Supple, no thyromegaly.  Chest: CTA bilaterally. No rhonchi, no wheezing. No use of accessory muscles.  CVS: Normal rate and regular rhythm no murmurs, without JVD, no pitting edema, pulses palpable.  Abdomen: Soft, non-tender, no guarding or rigidity, with normal bowel sounds.  Extremities: +left foot dressing  Skin: Warm, dry, no rash and no  worrisome lesions.  NEURO: No motor or sensory deficits.  Psychiatric: Alert, interactive, appropriate, normal affect.    Weight Monitoring 09/04/2021   Height 175.3 cm   Height Method Estimated   Weight 81.647 kg   Weight Method Estimated   BMI (calculated) 26.6 kg/m2           Intake/Output Summary (Last 24 hours) at 09/06/2021 0946  Last data filed at 09/05/2021 1856  Gross per 24 hour   Intake 300 ml   Output 603 ml   Net -303 ml     Body mass index is 26.58 kg/m.     Meds:     Current Facility-Administered Medications   Medication Dose Route Frequency    aspirin  81 mg Oral Daily    cefepime  2 g Intravenous Q12H    colchicine  0.6 mg Oral Daily    enoxaparin  40 mg Subcutaneous Q24H    gabapentin  300 mg Oral TID    insulin glargine  50 Units Subcutaneous Q12H    insulin lispro (1 Unit Dial)  2-24 Units Subcutaneous TID AC    And    insulin lispro (1 Unit Dial)  1-12 Units Subcutaneous QHS    isosorbide mononitrate  60 mg Oral Daily at 0600    metoprolol succinate XL  50 mg Oral Daily    potassium chloride  40 mEq Oral Once    sodium chloride (PF)  3 mL Intravenous Q12H [facility]    ticagrelor  90 mg Oral Q12H    vancomycin  1,500 mg Intravenous Q12H    vancomycin therapy placeholder   Does not apply See Admin Instructions       PRN Meds: acetaminophen **OR** acetaminophen **OR** acetaminophen, dextrose, glucagon (rDNA), HYDROcodone-acetaminophen, NSG Communication: Glucose POCT order (AC, HS) **AND** insulin lispro (1 Unit Dial) **AND** insulin lispro (1 Unit Dial) **AND** insulin lispro (1 Unit Dial), morphine, naloxone.     LABS:     Estimated Creatinine Clearance: 137.2 mL/min (A) (based on SCr of 0.73 mg/dL (L)).  Recent Labs   Lab 09/06/21  0330 09/05/21  0310   WBC 8.2 9.9   RBC 3.59* 4.02   Hemoglobin 9.7* 10.6*   Hematocrit 30.3* 34.4*   MCV 85 86   PLT CT 265 231     Recent Labs   Lab 09/04/21  2215   PT 10.7   PT INR 1.0         Lab Results   Component Value Date    HGBA1CPERCNT 8.7 09/05/2021     Recent  Labs   Lab 09/06/21  0330 09/05/21  0310 09/04/21  2345   Glucose  181* 260*  --    Sodium 141 136  --    Potassium 3.4* 3.3*  --    Chloride 111* 107  --    CO2 22 20  --    BUN 7 5*  --    Creatinine 0.73* 0.65*  --    Creatinine I-Stat  --   --  0.50*   EGFR 119 124 134   Calcium 8.3* 7.8*  --      Recent Labs   Lab 09/06/21  0330 09/05/21  0310   Magnesium 1.9 2.0   Albumin 2.5* 2.6*   Protein, Total 6.1 6.3   Bilirubin, Total 0.2 0.4   Alkaline Phosphatase 169* 188*   ALT 13 20   AST (SGOT) 9* 23     Recent Labs   Lab 09/04/21  2215   Urine Specific Gravity 1.010   pH, Urine 6.0   Protein, UR Trace*   Glucose, UA >=1000*   Ketones UA Trace*   Bilirubin, UA Negative   Blood, UA Moderate*   Nitrite, UA Negative   Urobilinogen, UA 0.2   Leukocyte Esterase, UA Negative   WBC, UA 4   RBC, UA 6*      Patient Lines/Drains/Airways Status       Active PICC Line / CVC Line / PIV Line / Drain / Airway / Intraosseous Line / Epidural Line / ART Line / Line / Wound / Pressure Ulcer / NG/OG Tube       Name Placement date Placement time Site Days    Peripheral IV 09/04/21 20 G Right Wrist 09/04/21  2146  Wrist  1    Urethral Catheter 09/05/21  0335  --  1    Non-Surgical Airway 09/05/21  1819  --  less than 1    Wound 09/05/21 Diabetic/ Neuropathy Foot Anterior;Left 4-67mm circular scabbed wound on top of foot 09/05/21  0524  Foot  1    Wound 09/05/21 Diabetic/ Neuropathy Heel Left 1.5-2 inch in diameter open reddened/pink wound to pad of foot 09/05/21  0525  Heel  1    Wound 09/05/21 Surgical Incision Foot Left 4x4 gauze soaked in saline, kling and ace bandage, clean and dry 09/05/21  1855  Foot  less than 1                   XR Foot Left AP Lateral And Oblique    Result Date: 09/04/2021  There is periostitis of the fourth metatarsal with suggestion of osteomyelitis of the fourth metatarsal head and possibly base of the fourth proximal phalanx. I suspect that there is septic arthritis of the fourth MTP joint. There is  cellulitis of the foot laterally and possible deep soft tissue gas. ReadingStation:[email or web address]    CT Abdomen Pelvis with IV Cont    Result Date: 09/05/2021  1. There is mild circumferential wall thickening of the rectum and distal sigmoid colon with mild adjacent stranding. This could be related to nondistention or represent a focal proctitis. No free fluid or abnormal gas collections are noted. Large amount  of gas and stool are seen in the proximal colon. 2. Foley catheter is present within the urinary bladder. There is mild stranding associated with the prostate and seminal vesicles as well and cannot exclude acute inflammation. 3. Dependent soft tissue edema. ReadingStation:[email or web address]    MRI Foot Left W WO Contrast    Result Date: 09/05/2021  1.  Diabetic left foot infection. 2.  Small deep plantar soft  tissue ulcer extending to the bone in the left forefoot with acute septic arthritis of the left fourth MTP joint and acute osteomyelitis with bone destruction of the adjacent proximal phalanx of the left fourth toe and distal fourth metatarsal bone. 3.  Reactive bone marrow edema or acute osteomyelitis without bone destruction of the heads of the left second and fourth metatarsals. ReadingStation:[email or web address]    Home Health Needs:  There are no questions and answers to display.       Nutrition assessment done in collaboration with Registered Dietitians:        Claiborne Rigg, DO     09/06/21,9:46 AM   MRN: [id number]                                      CSN: [id number] DOB: Jun 22, 1981

## 2021-09-06 NOTE — Progress Notes (Signed)
PROGRESS NOTE    Date Time: 09/06/21 12:16 PM  Patient Name: Arthur Kim, Arthur Kim      Subjective:   Patient seen less than 24 hours status post incision and drainage left foot.  Complaining of 8 out of 10 this morning despite Norco.  Patient relates no other concerns.  Medications:     Current Facility-Administered Medications   Medication Dose Route Frequency    aspirin  81 mg Oral Daily    cefepime  2 g Intravenous Q12H    colchicine  0.6 mg Oral Daily    enoxaparin  40 mg Subcutaneous Q24H    gabapentin  300 mg Oral TID    insulin glargine  50 Units Subcutaneous Q12H    insulin lispro (1 Unit Dial)  2-24 Units Subcutaneous TID AC    And    insulin lispro (1 Unit Dial)  1-12 Units Subcutaneous QHS    isosorbide mononitrate  60 mg Oral Daily at 0600    ketorolac  30 mg Intravenous Once    metoprolol succinate XL  50 mg Oral Daily    sodium chloride (PF)  3 mL Intravenous Q12H [facility]    ticagrelor  90 mg Oral Q12H    vancomycin  1,500 mg Intravenous Q12H    vancomycin therapy placeholder   Does not apply See Admin Instructions         Physical Exam:     Vitals:    09/06/21 1119   BP: 125/71   Pulse: 99   Resp: (!) 26   Temp: 99.3 F (37.4 C)   SpO2: 97%       Intake and Output Summary (Last 24 hours) at Date Time    Intake/Output Summary (Last 24 hours) at 09/06/2021 1216  Last data filed at 09/06/2021 0700  Gross per 24 hour   Intake 780 ml   Output 3 ml   Net 777 ml       General appearance - well appearing, and in no distress  Mental status - alert, oriented to person, place, and time, normal mood, behavior, speech, dress, motor activity, and thought processes  Neurological - alert, oriented,   Dressing on the left foot is clean dry and intact.  No strikethrough noted.  No calf pain noted    Labs:     Results       Procedure Component Value Units Date/Time    Tissue Culture and Smear [[id number]]  (Abnormal) Collected: 09/05/21 1853    Specimen: Tissue from Left Foot Updated: 09/06/21 1214     Gram Stain --     No  Epithelial Cells  Many WBC's Seen  Many Gram Positive Cocci  Moderate Gram Negative Rods  Occasional Gram Positive Rods       Isolate 1 Many  **Methicillin Resistant Staphylococcus aureus**  **MRSA isolated.**  Results called and read back by (licensed clinician/date/time/tech):  RN JUNG LEE 12.17.22 [id number]      Dextrose Stick Glucose [[id number]]  (Abnormal) Collected: 09/06/21 1121    Specimen: Blood Updated: 09/06/21 1123     Glucose POCT 196 mg/dL     Vancomycin, trough [[id number]] Collected: 09/06/21 0759    Specimen: Plasma Updated: 09/06/21 0910     Vancomycin Trough 12.3 mcg/mL     Dextrose Stick Glucose [[id number]]  (Abnormal) Collected: 09/06/21 0709    Specimen: Blood Updated: 09/06/21 0711     Glucose POCT 157 mg/dL     Anaerobic Culture [[id number]] Collected: 09/05/21 1853    Specimen:  Tissue from Left Foot Updated: 09/06/21 0634     Culture Result No Anaerobes Isolated - Day 1, Reincubate    Comprehensive metabolic panel [[id number]]  (Abnormal) Collected: 09/06/21 0330    Specimen: Plasma Updated: 09/06/21 0504     Sodium 141 mMol/L      Potassium 3.4 mMol/L      Chloride 111 mMol/L      CO2 22 mMol/L      Calcium 8.3 mg/dL      Glucose 102 mg/dL      Creatinine 7.25 mg/dL      BUN 7 mg/dL      Protein, Total 6.1 gm/dL      Albumin 2.5 gm/dL      Alkaline Phosphatase 169 U/L      ALT 13 U/L      AST (SGOT) 9 U/L      Bilirubin, Total 0.2 mg/dL      Albumin/Globulin Ratio 0.69 Ratio      Anion Gap 11.4 mMol/L      BUN / Creatinine Ratio 9.6 Ratio      EGFR 119 mL/min/1.56m2      Osmolality Calculated 284 mOsm/kg      Globulin 3.6 gm/dL     Magnesium [[id number]] Collected: 09/06/21 0330    Specimen: Plasma Updated: 09/06/21 0504     Magnesium 1.9 mg/dL     CBC and differential [[id number]]  (Abnormal) Collected: 09/06/21 0330    Specimen: Blood Updated: 09/06/21 0501     WBC 8.2 K/cmm      RBC 3.59 M/cmm      Hemoglobin 9.7 gm/dL      Hematocrit 56.4 %      MCV 85 fL      MCH 27 pg      MCHC 32 gm/dL       RDW 33.2 %      PLT CT 265 K/cmm      MPV 8.5 fL      Neutrophils % 73.1 %      Lymphocytes 12.7 %      Monocytes 10.0 %      Eosinophils % 4.1 %      Basophils % 0.1 %      Neutrophils Absolute 6.0 K/cmm      Lymphocytes Absolute 1.0 K/cmm      Monocytes Absolute 0.8 K/cmm      Eosinophils Absolute 0.3 K/cmm      Basophils Absolute 0.0 K/cmm     Dextrose Stick Glucose [[id number]]  (Abnormal) Collected: 09/06/21 0425    Specimen: Blood Updated: 09/06/21 0427     Glucose POCT 199 mg/dL     Dextrose Stick Glucose [[id number]] Collected: 09/05/21 2119    Specimen: Blood Updated: 09/05/21 2120     Glucose POCT 83 mg/dL     Wound Culture and Smear [[id number]] Collected: 09/05/21 2025    Specimen: Wound from Left Foot Updated: 09/05/21 2045     Gram Stain --     Few Epithelial Cells  Occasional WBC's Seen  No Organisms Seen      Blood Culture - Venipuncture [[id number]] Collected: 09/04/21 2344    Specimen: Blood from Venipuncture Updated: 09/05/21 2006     Culture Result --     Blood culture volume for this collection was less than the optimal 5 ml needed to achieve adequate sensitivity.  No Growth To Date      Blood Culture - Venipuncture [[id number]] Collected: 09/04/21 2215    Specimen: Blood from  Venipuncture Updated: 09/05/21 2006     Culture Result No Growth To Date    Dextrose Stick Glucose [[id number]]  (Abnormal) Collected: 09/05/21 1937    Specimen: Blood Updated: 09/05/21 1939     Glucose POCT 144 mg/dL     Dextrose Stick Glucose [[id number]]  (Abnormal) Collected: 09/05/21 1911    Specimen: Blood Updated: 09/05/21 1912     Glucose POCT 68 mg/dL     Dextrose Stick Glucose [[id number]] Collected: 09/05/21 1654    Specimen: Blood Updated: 09/05/21 1655     Glucose POCT 86 mg/dL             Recent CBC   Recent Labs     09/06/21  0330   RBC 3.59*   Hemoglobin 9.7*   Hematocrit 30.3*   MCV 85   MCH 27*   MCHC 32   RDW 16.3*   MPV 8.5     Recent BMP   Recent Labs     09/06/21  0330   Glucose 181*   BUN 7   Creatinine  0.73*   Calcium 8.3*   Sodium 141   Potassium 3.4*   Chloride 111*   CO2 22       Lab Results   Component Value Date    WBC 8.2 09/06/2021    HGB 9.7 (L) 09/06/2021    PLT 265 09/06/2021    NA 141 09/06/2021    K 3.4 (L) 09/06/2021    BUN 7 09/06/2021    CREAT 0.73 (L) 09/06/2021    MG 1.9 09/06/2021    AST 9 (L) 09/06/2021    ALB 2.5 (L) 09/06/2021    INR 1.0 09/04/2021       Rads:   Radiological Procedure reviewed.    Assessment:   Limb threatening diabetic foot infection.  Diabetic ulcer left foot.  Questionable septic arthritis to the left fourth metatarsal phalangeal joint only per MRI.  Clinical signs do not correlate at this time.  Cellulitis left foot    Plan:   Patient was seen, examined and informed of findings and treatment plan.  Patient is given 30 mg IV Toradol now.  Plan for dressing change tomorrow.  At this point am not overly concerned about the left fourth metatarsal phalangeal joint.  He does have an open wound there which we will watch.  There is no redness or gross infection noted to the fourth toe or fourth MTPJ at this time.  I am more concerned with the midfoot and hindfoot.  Plan for first dressing change tomorrow morning.  He is likely dealing with a multi microbial wound.  May need further surgery.  Even the MRI was unimpressive of the midfoot and hindfoot.  Out of bed to chair ad lib. from my standpoint.  Toe-touch weightbearing with walker.        Signed by: Jannett Celestine, DPM

## 2021-09-06 NOTE — Op Note (Signed)
FULL OPERATIVE NOTE    Date Time: 09/06/21 9:56 AM  Patient Name: Arthur Kim  Attending Physician: Claiborne Rigg, DO      Date of Operation:   09/05/2021    Providers Performing:   Surgeon(s):  Jannett Celestine, DPM    Anesthesiologist: Adele Dan, MD  CRNA: Alric Ran, CRNA    Circulator: Lenon Curt, RN  Scrub Person: Wilmer, Lolita Patella, CSA    Anesthesia/Sedation:   MAC    Operative Procedure:   Procedure(s):  DEBRIDEMENT & IRRIGATION OF LEFT FOOT - Wound Class: Dirty or Infected     Preoperative Diagnosis:   Pre-Op Diagnosis Codes:     * Diabetic ulcer of left foot associated with diabetes mellitus due to underlying condition, unspecified part of foot, unspecified ulcer stage [E08.621, L97.529]     * Osteomyelitis of left foot, unspecified type [M86.9]    Postoperative Diagnosis:   Post-Op Diagnosis Codes:     * Diabetic ulcer of left foot associated with diabetes mellitus due to underlying condition, unspecified part of foot, unspecified ulcer stage [E08.621, L97.529]     * Osteomyelitis of left foot, unspecified type [M86.9]    Findings:   No gross infection noted to the 4th toe and MPJ.  Full thickness ulceration plantar aspect of the left foot extending to and including the plantar fascia.  Proximal tracking of the wound into the medial aspect of the foot and the ankle.  No gross purulence noted.       Indications:   Limb threatening diabetic foot infection.    Operative Notes:   Under mild sedation the patient is brought to the OR and placed on the OR table in the supine position.  After the appropriate time out the procedure was started. Following IV sedation, an ankle block was performed using 10cc 0.5% marcaine plain.  The left lower extremity was then scrubbed, prepped and draped in the usual aseptic manner.  Attention was then directed to the wound on the plantar asepct of the left foot which was completely excised.  The incision was carried down to the level of the deep and  plantar fascia to an area of necrotic and non viable tissue. All devitalized tissue was then removed in toto and sent to microbiology.  The wound was noted to probe under the deep fascia along the medial aspect of the foot into the medial ankle.  No gross purulence was expressed or noted whatsoever.  The wound was then flushed with copious amounts of normal saline and then dressed with a moist to dry dressing.    The decision was made not to amputate or perform an incision and drainage the left 4th ray due to clinical findings not corresponding to the MRI findings of septic arthritis.  There is a stable appearing wound at the dorsal aspect of the left 4th metatarsal area with no active drainage.  This area will be monitored over the next few days.    Will readmit to the floor and await intra op cultures and start local wound care in 2 days.  Estimated Blood Loss:    3 mL    Implants:   * No implants in log *    Drains:   Drains: no    Specimens:     ID Type Source Tests Collected by Time Destination   1 : Tissue Left foot Tissue Left Foot VH CULTURE AND SMEAR, TISSUE, VH CULTURE, ANAEROBIC Jannett Celestine, DPM 09/05/2021 1853  Complications:   * No complications entered in OR log *      Signed by: Alton Revere, MD

## 2021-09-07 ENCOUNTER — Encounter: Payer: Self-pay | Admitting: Student in an Organized Health Care Education/Training Program

## 2021-09-07 LAB — COMPREHENSIVE METABOLIC PANEL
ALT: 10 U/L (ref 0–55)
AST (SGOT): 7 U/L — ABNORMAL LOW (ref 10–42)
Albumin/Globulin Ratio: 0.65 Ratio — ABNORMAL LOW (ref 0.80–2.00)
Albumin: 2.4 gm/dL — ABNORMAL LOW (ref 3.5–5.0)
Alkaline Phosphatase: 147 U/L — ABNORMAL HIGH (ref 40–145)
Anion Gap: 11.1 mMol/L (ref 7.0–18.0)
BUN / Creatinine Ratio: 15.6 Ratio (ref 10.0–30.0)
BUN: 10 mg/dL (ref 7–22)
Bilirubin, Total: 0.2 mg/dL (ref 0.1–1.2)
CO2: 23 mMol/L (ref 20–30)
Calcium: 8.7 mg/dL (ref 8.5–10.5)
Chloride: 107 mMol/L (ref 98–110)
Creatinine: 0.64 mg/dL — ABNORMAL LOW (ref 0.80–1.30)
EGFR: 124 mL/min/{1.73_m2} (ref 60–150)
Globulin: 3.7 gm/dL (ref 2.0–4.0)
Glucose: 78 mg/dL (ref 71–99)
Osmolality Calculated: 274 mOsm/kg — ABNORMAL LOW (ref 275–300)
Potassium: 3.1 mMol/L — ABNORMAL LOW (ref 3.5–5.3)
Protein, Total: 6.1 gm/dL (ref 6.0–8.3)
Sodium: 138 mMol/L (ref 136–147)

## 2021-09-07 LAB — VH CULTURE AND SMEAR, TISSUE

## 2021-09-07 LAB — VH DEXTROSE STICK GLUCOSE
Glucose POCT: 61 mg/dL — ABNORMAL LOW (ref 71–99)
Glucose POCT: 75 mg/dL (ref 71–99)
Glucose POCT: 123 mg/dL — ABNORMAL HIGH (ref 71–99)
Glucose POCT: 69 mg/dL — ABNORMAL LOW (ref 71–99)
Glucose POCT: 106 mg/dL — ABNORMAL HIGH (ref 71–99)
Glucose POCT: 136 mg/dL — ABNORMAL HIGH (ref 71–99)

## 2021-09-07 LAB — CBC AND DIFFERENTIAL
Basophils %: 0.3 % (ref 0.0–3.0)
Basophils Absolute: 0 10*3/uL (ref 0.0–0.3)
Eosinophils %: 3.4 % (ref 0.0–7.0)
Eosinophils Absolute: 0.3 10*3/uL (ref 0.0–0.8)
Hematocrit: 32.1 % — ABNORMAL LOW (ref 39.0–52.5)
Hemoglobin: 10.3 gm/dL — ABNORMAL LOW (ref 13.0–17.5)
Lymphocytes Absolute: 1.2 10*3/uL (ref 0.6–5.1)
Lymphocytes: 14.9 % — ABNORMAL LOW (ref 15.0–46.0)
MCH: 27 pg — ABNORMAL LOW (ref 28–35)
MCHC: 32 gm/dL (ref 32–36)
MCV: 85 fL (ref 80–100)
MPV: 8 fL (ref 6.0–10.0)
Monocytes Absolute: 1 10*3/uL (ref 0.1–1.7)
Monocytes: 12.8 % (ref 3.0–15.0)
Neutrophils %: 68.6 % (ref 42.0–78.0)
Neutrophils Absolute: 5.5 10*3/uL (ref 1.7–8.6)
PLT CT: 292 10*3/uL (ref 130–440)
RBC: 3.78 10*6/uL — ABNORMAL LOW (ref 4.00–5.70)
RDW: 16.2 % — ABNORMAL HIGH (ref 11.0–14.0)
WBC: 8 10*3/uL (ref 4.0–11.0)

## 2021-09-07 LAB — MAGNESIUM: Magnesium: 1.7 mg/dL (ref 1.6–2.6)

## 2021-09-07 MED ORDER — VH POTASSIUM CHLORIDE CRYS ER 20 MEQ PO TBCR (WRAP)
40.0000 meq | EXTENDED_RELEASE_TABLET | Freq: Once | ORAL | Status: AC
Start: 2021-09-07 — End: 2021-09-07
  Administered 2021-09-07: 40 meq via ORAL
  Filled 2021-09-07: qty 2

## 2021-09-07 MED ORDER — HYDROMORPHONE HCL 0.5 MG/0.5 ML IJ SOLN
0.5000 mg | Freq: Four times a day (QID) | INTRAMUSCULAR | Status: DC | PRN
Start: 2021-09-07 — End: 2021-09-11
  Administered 2021-09-07 – 2021-09-11 (×12): 0.5 mg via INTRAVENOUS
  Filled 2021-09-07 (×12): qty 0.5

## 2021-09-07 MED ORDER — INSULIN LISPRO (1 UNIT DIAL) 100 UNIT/ML SC SOPN
1.0000 [IU] | PEN_INJECTOR | Freq: Every evening | SUBCUTANEOUS | Status: DC
Start: 2021-09-07 — End: 2021-09-09
  Administered 2021-09-08: 22:00:00 2 [IU] via SUBCUTANEOUS

## 2021-09-07 MED ORDER — INSULIN LISPRO (1 UNIT DIAL) 100 UNIT/ML SC SOPN
1.0000 [IU] | PEN_INJECTOR | Freq: Every day | SUBCUTANEOUS | Status: DC | PRN
Start: 2021-09-07 — End: 2021-09-09
  Administered 2021-09-08: 04:00:00 1 [IU] via SUBCUTANEOUS

## 2021-09-07 MED ORDER — LANTUS SOLOSTAR 100 UNIT/ML SC SOPN
15.0000 [IU] | PEN_INJECTOR | Freq: Once | SUBCUTANEOUS | Status: AC
Start: 2021-09-07 — End: 2021-09-07
  Administered 2021-09-07: 15 [IU] via SUBCUTANEOUS

## 2021-09-07 MED ORDER — INSULIN LISPRO (1 UNIT DIAL) 100 UNIT/ML SC SOPN
1.0000 [IU] | PEN_INJECTOR | Freq: Three times a day (TID) | SUBCUTANEOUS | Status: DC
Start: 2021-09-07 — End: 2021-09-09
  Administered 2021-09-08 (×2): 2 [IU] via SUBCUTANEOUS
  Administered 2021-09-09 (×2): 3 [IU] via SUBCUTANEOUS

## 2021-09-07 NOTE — Plan of Care (Signed)
Problem: Inadequate Cardiac Output  Goal: Adequate tissue perfusion will be maintained  Outcome: Progressing  Flowsheets (Taken 09/05/2021 0242 by Leighton Roach, RN)  Adequate tissue perfusion will be maintained:   Monitor/assess vital signs   Monitor/assess lab values and report abnormal values   Monitor/assess neurovascular status (pulses, capillary refill, pain, paresthesia, paralysis, presence of edema)   Monitor intake and output   Monitor for signs and symptoms of a pulmonary embolism (dyspnea, tachypnea, tachycardia, confusion)   Monitor/assess for signs of VTE (edema of calf/thigh redness, pain)   VTE Prevention: Administer anticoagulant(s) and/or apply anti-embolism stockings/devices as ordered   Encourage/assist patient as needed to turn, cough, and perform deep breathing every 2 hours     Problem: Infection  Goal: Free from infection  Outcome: Progressing  Flowsheets (Taken 09/05/2021 0242 by Leighton Roach, RN)  Free from infection:   Assess for signs/symptoms of infection   Assess immunization status   Utilize isolation precautions per protocol/policy   Consult/collaborate with Infection Preventionist   Utilize sepsis protocol

## 2021-09-07 NOTE — Progress Notes (Signed)
Medicine Progress Note   [facility]  [employer]   Patient Name: Arthur Kim, Arthur Kim LOS: 2 days   Attending Physician: Claiborne Rigg, DO PCP: Pcp, None, MD      Hospital Course:                                                            Arthur Kim is a 40 y.o. male patient with past medical history of prostatitis with indwelling Foley catheter, diabetes, diabetic ketoacidosis, diabetic foot ulcerations presented with worsening left foot pain . He states He has been with having diabetic ulcer with recurrent infections requiring greater than a month hospitalization currently on doxycycline and Bactrim. 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      In the ED he was medically stable afebrile remarkable for leukocytosis, elevated CRP ESR, elevated lactic acid 2.5 x-ray left foot showed possible osteomyelitis of the left fourth toe patient be admitted for further management.    Podiatry consulted.  Underwent left foot I&D on 12/16.     Assessment and Plan:      #Diabetic ulcer of the left foot with cellulitis:  #Possible osteomyelitis of the fourth left toe  #Possible septic arthritis of the left fourth MTP joint  #Lactic acidosis  -hemodynamically stable afebrile  -Xray There is periostitis of the fourth metatarsal with suggestion of osteomyelitis of the fourth metatarsal head and possibly base of the fourth proximal phalanx. I suspect that there is septic arthritis of the fourth MTP joint.  -MRI left foot showed Diabetic left foot infection. Small deep plantar soft tissue ulcer extending to the bone in the left forefoot with acute septic arthritis of the left  fourth MTP joint and acute osteomyelitis with bone destruction of the adjacent proximal phalanx of the left fourth toe and distal fourth metatarsal bone. Reactive bone marrow edema or acute osteomyelitis without bone destruction of the heads of the left second and fourth metatarsals.  -Blood cultures negative to date  -OR cultures growing MRSA  -continue vancomycin and cefepime  -pain control with norco prn + change to IV dilaudid prn severe pain  -may need further surgical intervention  -MRI left heel pending  -will need ID consult      #Mild DKA present on admission  #Type 1 diabetes:  -hba1c: 8.7  -We will continue with home dose Lantus 50 twice daily  -SSI scale Accu-Cheks     #Hypokalemia  #Hypomagnesemia  -replete K  -repeat BMP in AM     #History of coronary disease status post PCI and stent  -Continue aspirin and Brilinta  -continue Toprol XL     #History of hypertension  -Continue Toprol XL and Imdur    #Prostatitis:  #BPH  -Has a Foley catheter in place due to enlarged prostate and incomplete bladder emptying   -will need outpatient urology follow up    Disposition: Inpatient  DVT PPX: Medication VTE Prophylaxis  Orders: enoxaparin (LOVENOX) syringe 40 mg  Mechanical VTE Prophylaxis Orders: Mechanical VTE: Pneumatic Compression; Knee high  Code:  Full Code       Subjective     Patient seen and examined.  No overnight events.  Reports left foot pain - intermittent in intensity.    Objective   Physical Exam:     Vitals: T:97.9 F (36.6 C) (Temporal), BP:137/80, HR:(!) 113, RR:20, SaO2:97%    General: Patient is awake. In no acute distress.  HEENT: No conjunctival drainage, vision is intact, anicteric sclera.  Neck: Supple, no thyromegaly.  Chest: CTA bilaterally. No rhonchi, no wheezing. No use of accessory muscles.  CVS: Normal rate and regular rhythm no murmurs, without JVD, no pitting edema, pulses palpable.  Abdomen: Soft, non-tender, no guarding or rigidity, with normal bowel sounds.  Extremities: +left  foot dressing  Skin: Warm, dry, no rash and no worrisome lesions.  NEURO: No motor or sensory deficits.  Psychiatric: Alert, interactive, appropriate, normal affect.    Weight Monitoring 09/04/2021   Height 175.3 cm   Height Method Estimated   Weight 81.647 kg   Weight Method Estimated   BMI (calculated) 26.6 kg/m2           Intake/Output Summary (Last 24 hours) at 09/07/2021 0952  Last data filed at 09/07/2021 0500  Gross per 24 hour   Intake 1170 ml   Output 850 ml   Net 320 ml       Body mass index is 26.58 kg/m.     Meds:     Current Facility-Administered Medications   Medication Dose Route Frequency    aspirin  81 mg Oral Daily    cefepime  2 g Intravenous Q12H    colchicine  0.6 mg Oral Daily    enoxaparin  40 mg Subcutaneous Q24H    gabapentin  300 mg Oral TID    insulin glargine  50 Units Subcutaneous Q12H    insulin lispro (1 Unit Dial)  1-9 Units Subcutaneous TID AC    And    insulin lispro (1 Unit Dial)  1-7 Units Subcutaneous QHS    isosorbide mononitrate  60 mg Oral Daily at 0600    metoprolol succinate XL  50 mg Oral Daily    potassium chloride  40 mEq Oral Once    sodium chloride (PF)  3 mL Intravenous Q12H [facility]    ticagrelor  90 mg Oral Q12H    vancomycin  1,500 mg Intravenous Q12H    vancomycin therapy placeholder   Does not apply See Admin Instructions       PRN Meds: acetaminophen **OR** acetaminophen **OR** acetaminophen, dextrose, glucagon (rDNA), HYDROcodone-acetaminophen, NSG Communication: Glucose POCT order (AC, HS) **AND** insulin lispro (1 Unit Dial) **AND** insulin lispro (1 Unit Dial) **AND** insulin lispro (1 Unit Dial), morphine, naloxone.     LABS:     Estimated Creatinine Clearance: 156.5 mL/min (A) (based on SCr of 0.64 mg/dL (L)).  Recent Labs   Lab 09/07/21  0541 09/06/21  0330   WBC 8.0 8.2   RBC 3.78* 3.59*   Hemoglobin 10.3* 9.7*   Hematocrit 32.1* 30.3*   MCV 85 85   PLT CT 292 265       Recent Labs   Lab 09/04/21  2215   PT 10.7   PT INR 1.0           Lab Results   Component  Value Date    HGBA1CPERCNT 8.7 09/05/2021  Recent Labs   Lab 09/07/21  0541 09/06/21  0330 09/05/21  0310   Glucose 78 181* 260*   Sodium 138 141 136   Potassium 3.1* 3.4* 3.3*   Chloride 107 111* 107   CO2 23 22 20    BUN 10 7 5*   Creatinine 0.64* 0.73* 0.65*   EGFR 124 119 124   Calcium 8.7 8.3* 7.8*       Recent Labs   Lab 09/07/21  0541 09/06/21  0330   Magnesium 1.7 1.9   Albumin 2.4* 2.5*   Protein, Total 6.1 6.1   Bilirubin, Total 0.2 0.2   Alkaline Phosphatase 147* 169*   ALT 10 13   AST (SGOT) 7* 9*       Recent Labs   Lab 09/04/21  2215   Urine Specific Gravity 1.010   pH, Urine 6.0   Protein, UR Trace*   Glucose, UA >=1000*   Ketones UA Trace*   Bilirubin, UA Negative   Blood, UA Moderate*   Nitrite, UA Negative   Urobilinogen, UA 0.2   Leukocyte Esterase, UA Negative   WBC, UA 4   RBC, UA 6*        Patient Lines/Drains/Airways Status       Active PICC Line / CVC Line / PIV Line / Drain / Airway / Intraosseous Line / Epidural Line / ART Line / Line / Wound / Pressure Ulcer / NG/OG Tube       Name Placement date Placement time Site Days    Peripheral IV 09/04/21 20 G Right Wrist 09/04/21  2146  Wrist  1    Urethral Catheter 09/05/21  0335  --  1    Non-Surgical Airway 09/05/21  1819  --  less than 1    Wound 09/05/21 Diabetic/ Neuropathy Foot Anterior;Left 4-59mm circular scabbed wound on top of foot 09/05/21  0524  Foot  1    Wound 09/05/21 Diabetic/ Neuropathy Heel Left 1.5-2 inch in diameter open reddened/pink wound to pad of foot 09/05/21  0525  Heel  1    Wound 09/05/21 Surgical Incision Foot Left 4x4 gauze soaked in saline, kling and ace bandage, clean and dry 09/05/21  1855  Foot  less than 1                   XR Foot Left AP Lateral And Oblique    Result Date: 09/04/2021  There is periostitis of the fourth metatarsal with suggestion of osteomyelitis of the fourth metatarsal head and possibly base of the fourth proximal phalanx. I suspect that there is septic arthritis of the fourth MTP joint.  There is cellulitis of the foot laterally and possible deep soft tissue gas. ReadingStation:[email or web address]    CT Abdomen Pelvis with IV Cont    Result Date: 09/05/2021  1. There is mild circumferential wall thickening of the rectum and distal sigmoid colon with mild adjacent stranding. This could be related to nondistention or represent a focal proctitis. No free fluid or abnormal gas collections are noted. Large amount  of gas and stool are seen in the proximal colon. 2. Foley catheter is present within the urinary bladder. There is mild stranding associated with the prostate and seminal vesicles as well and cannot exclude acute inflammation. 3. Dependent soft tissue edema. ReadingStation:[email or web address]    MRI Foot Left W WO Contrast    Result Date: 09/05/2021  1.  Diabetic left foot infection. 2.  Small deep plantar soft tissue ulcer extending to  the bone in the left forefoot with acute septic arthritis of the left fourth MTP joint and acute osteomyelitis with bone destruction of the adjacent proximal phalanx of the left fourth toe and distal fourth metatarsal bone. 3.  Reactive bone marrow edema or acute osteomyelitis without bone destruction of the heads of the left second and fourth metatarsals. ReadingStation:[email or web address]    Home Health Needs:  There are no questions and answers to display.       Nutrition assessment done in collaboration with Registered Dietitians:        Claiborne Rigg, DO     09/07/21,9:52 AM   MRN: [id number]                                      CSN: [id number] DOB: [birth year]

## 2021-09-07 NOTE — Progress Notes (Signed)
Patient:  Arthur Kim, Arthur Kim, 40 y.o. 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]  Cellulitis of left foot [L03.116]  Elevated beta-hydroxybutyrate [R78.89]  Osteomyelitis of left foot, unspecified type [M86.9]  Diabetic ulcer of left midfoot associated with type 2 diabetes mellitus, unspecified ulcer stage [E11.621, L97.429]  Type 2 diabetes mellitus with hyperglycemia, unspecified whether long term insulin use [E11.65]    Events (last 24 hrs/overnight):        Nursing Shift Summary:      Assumed care from off going RN. Patient is continues to have pain PRN Norco given.  Currently in bed resting.  Call bell within reach   Vitals:      BP: 137/80 (09/07/2021  8:14 AM)  Heart Rate: (!) 113 (09/07/2021  8:14 AM)  Temp: 97.9 F (36.6 C) (09/07/2021  7:38 AM)  Resp Rate: 20 (09/07/2021  7:38 AM)   Oxygen Needs:    SpO2: 97 % (09/07/2021  7:38 AM)  O2 Device: None (Room air) (09/07/2021  7:38 AM)      Last 3 Weights:    Recent Weights for the past 720 hrs (Last 3 readings):   Weight   09/04/21 2131 81.6 kg (180 lb)       Tele:   Last order of [facility] TELEMETRY MONITORING was found on 09/07/2021 from Hospital Encounter on 09/04/2021         Cardiac Rhythm: Normal Sinus Rhythm; Sinus Tach     Drips:         Current Facility-Administered Medications   Medication Dose Last Admin      I/O: Last 24 hours:    UOP:  Foley/Drain LDA(s):       Intake/Output Summary (Last 24 hours) at 09/07/2021 0950  Last data filed at 09/07/2021 0500  Gross per 24 hour   Intake 1170 ml   Output 850 ml   Net 320 ml        Drain Output:  Urethral Catheter (Active)   Catheter necessity reviewed? Yes 09/07/21 0600   Urine Output (mL) Total 300 mL 09/07/21 0500    No order of INSERT  FOLEY CATHETER is found.     No order of EXTERNAL URINARY CATHETER is found.     If there is a catheter present (internal or external)   "Should we continue the catheter?"  []  Remove   [x]  Keep           Last BM: Last BM Date: 09/04/21       Unmeasured Output This Shift:    No data found.     VTE PPX:       Current Facility-Administered Medications (Includes Only Anticoagulants)   Medication Dose Route Frequency Provider Last Rate Last Admin    enoxaparin (LOVENOX) syringe 40 mg  40 mg Subcutaneous Q24H Alyacoub, Ramez I, MD           Skin Issues/Concerns:      Skin issues noted this shift:      Wound 09/05/21 Diabetic/ Neuropathy Foot Anterior;Left 4-57mm circular scabbed wound on top of foot (Active)   09/05/21 0524 Foot   Wound Type: Diabetic/ Neuropathy   Pressure Injury Staging (WOCN/ Trained RNs Only):    Wound Location Orientation: Anterior;Left   Wound Description (Comments): 4-41mm circular scabbed  wound on top of foot   Present on Admission: Yes   WOCN Wound Description (Comments):    Non-staged Wound Description:    Site Description Dressing covering site (UTA) 09/06/21 2130   Peri-wound Description Clean;Dry;Intact 09/06/21 2130       Wound 09/05/21 Diabetic/ Neuropathy Heel Left 1.5-2 inch in diameter open reddened/pink wound to pad of foot (Active)   09/05/21 0525 Heel   Wound Type: Diabetic/ Neuropathy   Pressure Injury Staging (WOCN/ Trained RNs Only):    Wound Location Orientation: Left   Wound Description (Comments): 1.5-2 inch in diameter open reddened/pink wound to pad of foot   Present on Admission: Yes   WOCN Wound Description (Comments):    Non-staged Wound Description:    Site Description Dressing covering site (UTA) 09/06/21 2130       Wound 09/05/21 Surgical Incision Foot Left 4x4 gauze soaked in saline, kling and ace bandage, clean and dry (Active)   09/05/21 1855 Foot   Wound Type: Surgical Incision   Pressure Injury Staging (WOCN/ Trained RNs Only):    Wound Location Orientation: Left    Wound Description (Comments): 4x4 gauze soaked in saline, kling and ace bandage, clean and dry   Present on Admission:    WOCN Wound Description (Comments):    Non-staged Wound Description:    Site Description Dressing covering site (UTA) 09/06/21 2130   Closure Approximated 09/05/21 1900   Dressing Gauze Bandage Roll;Ace Wrap 09/05/21 1900   Dressing Changed New 09/05/21 1900   Dressing Status Clean;Dry;Intact 09/05/21 2220         Chem Sticks (Hyper/Hypo)       Glucose POCT   Date Value Ref Range Status   09/07/2021 123 (H) 71 - 99 mg/dL Final     Comment:     The above 1 analytes were performed by [facility] [id number])  [address],[place] [address]         Mobility:        PMP Activity: Step 4 - Dangle at Bedside (09/06/2021  8:04 AM)      Falls Risk:   Risk Category: High      PT/OT (recommendations):      No data recorded    Care Act Designee to care for patient after discharge:      [facility] Provider?: Refused (09/05/2021  2:00 AM)      Care Partner(s) who will help during hospital stay:      No data recorded    Patient Rounding, Team Members Present    [x]  Rounds Occurred   [x]  Patient    [x]  MD    []  CM    [x]  Primary RN    [x]  Charge Nurse    []  Pharmacist      ("In a few words, as a patient, do you have any goals or concerns you would like to share  with the team today?" )     Patient stated goals and/or concerns:          Action plan:

## 2021-09-07 NOTE — Progress Notes (Addendum)
PROGRESS NOTE    Date Time: 09/07/21 10:32 AM  Patient Name: Arthur Kim, Arthur Kim      Subjective:   Follow-up left foot.  Still complaining of 8 out of 10 pain sometimes 10 out of 10 pain not controlled with medication.  Patient relates no other concerns.  Medications:     Current Facility-Administered Medications   Medication Dose Route Frequency    aspirin  81 mg Oral Daily    cefepime  2 g Intravenous Q12H    colchicine  0.6 mg Oral Daily    enoxaparin  40 mg Subcutaneous Q24H    gabapentin  300 mg Oral TID    insulin glargine  50 Units Subcutaneous Q12H    insulin lispro (1 Unit Dial)  1-9 Units Subcutaneous TID AC    And    insulin lispro (1 Unit Dial)  1-7 Units Subcutaneous QHS    isosorbide mononitrate  60 mg Oral Daily at 0600    metoprolol succinate XL  50 mg Oral Daily    sodium chloride (PF)  3 mL Intravenous Q12H [facility]    ticagrelor  90 mg Oral Q12H    vancomycin  1,500 mg Intravenous Q12H    vancomycin therapy placeholder   Does not apply See Admin Instructions         Physical Exam:     Vitals:    09/07/21 0814   BP: 137/80   Pulse: (!) 113   Resp:    Temp:    SpO2:        Intake and Output Summary (Last 24 hours) at Date Time    Intake/Output Summary (Last 24 hours) at 09/07/2021 1032  Last data filed at 09/07/2021 1000  Gross per 24 hour   Intake 1170 ml   Output 1800 ml   Net -630 ml       General appearance - well appearing, and in no distress  Mental status - alert, oriented to person, place, and time, normal mood, behavior, speech, dress, motor activity, and thought processes  Neurological - alert, oriented, normal speech    The left fourth toe and distal forefoot area still appear normal.  Small wound to the dorsal aspect of the left fourth metatarsal without drainage today.  The plantar surgical wound is clean and granular.  There is no purulence.  There is still a persistent area of warmth and edema and erythema at the plantar and medial aspect of the left foot going into the heel.  This area is  still extremely tender to palpation.  No hindfoot lesions are noted.    Labs:     Results       Procedure Component Value Units Date/Time    Dextrose Stick Glucose [[id number]]  (Abnormal) Collected: 09/07/21 0737    Specimen: Blood Updated: 09/07/21 0739     Glucose POCT 123 mg/dL     Wound Culture and Smear [[id number]]  (Abnormal) Collected: 09/05/21 7829    Specimen: Wound from Left Foot Updated: 09/07/21 0706     Gram Stain --     Few Epithelial Cells  Occasional WBC's Seen  No Organisms Seen       Isolate 1 Few  **Methicillin Resistant Staphylococcus aureus**  **MRSA isolated.**  Results called and read back by (licensed clinician/date/time/tech):  RN JUNG LEE 12.17.22 1214 88      Tissue Culture and Smear [[id number]]  (Abnormal)  (Susceptibility) Collected: 09/05/21 1853    Specimen: Tissue from Left Foot Updated: 09/07/21 7846  Gram Stain --     No Epithelial Cells  Many WBC's Seen  Many Gram Positive Cocci  Moderate Gram Negative Rods  Occasional Gram Positive Rods       Isolate 1 Many  **Methicillin Resistant Staphylococcus aureus**  **MRSA isolated.**  Results called and read back by (licensed clinician/date/time/tech):  RN JUNG LEE 12.17.22 1214 88  Rifampin should not be used alone for antimicrobial therapy.      Comprehensive metabolic panel [[id number]]  (Abnormal) Collected: 09/07/21 0541    Specimen: Plasma Updated: 09/07/21 0638     Sodium 138 mMol/L      Potassium 3.1 mMol/L      Chloride 107 mMol/L      CO2 23 mMol/L      Calcium 8.7 mg/dL      Glucose 78 mg/dL      Creatinine 9.14 mg/dL      BUN 10 mg/dL      Protein, Total 6.1 gm/dL      Albumin 2.4 gm/dL      Alkaline Phosphatase 147 U/L      ALT 10 U/L      AST (SGOT) 7 U/L      Bilirubin, Total 0.2 mg/dL      Albumin/Globulin Ratio 0.65 Ratio      Anion Gap 11.1 mMol/L      BUN / Creatinine Ratio 15.6 Ratio      EGFR 124 mL/min/1.25m2      Osmolality Calculated 274 mOsm/kg      Globulin 3.7 gm/dL     Magnesium [[id number]] Collected: 09/07/21  0541    Specimen: Plasma Updated: 09/07/21 0638     Magnesium 1.7 mg/dL     CBC and differential [[id number]]  (Abnormal) Collected: 09/07/21 0541    Specimen: Blood Updated: 09/07/21 0604     WBC 8.0 K/cmm      RBC 3.78 M/cmm      Hemoglobin 10.3 gm/dL      Hematocrit 62.9 %      MCV 85 fL      MCH 27 pg      MCHC 32 gm/dL      RDW 52.8 %      PLT CT 292 K/cmm      MPV 8.0 fL      Neutrophils % 68.6 %      Lymphocytes 14.9 %      Monocytes 12.8 %      Eosinophils % 3.4 %      Basophils % 0.3 %      Neutrophils Absolute 5.5 K/cmm      Lymphocytes Absolute 1.2 K/cmm      Monocytes Absolute 1.0 K/cmm      Eosinophils Absolute 0.3 K/cmm      Basophils Absolute 0.0 K/cmm     Dextrose Stick Glucose [[id number]] Collected: 09/07/21 0537    Specimen: Blood Updated: 09/07/21 0548     Glucose POCT 75 mg/dL     Dextrose Stick Glucose [[id number]]  (Abnormal) Collected: 09/07/21 0409    Specimen: Blood Updated: 09/07/21 0411     Glucose POCT 61 mg/dL     Dextrose Stick Glucose [[id number]]  (Abnormal) Collected: 09/06/21 2121    Specimen: Blood Updated: 09/06/21 2123     Glucose POCT 154 mg/dL     Dextrose Stick Glucose [[id number]]  (Abnormal) Collected: 09/06/21 1627    Specimen: Blood Updated: 09/06/21 1628     Glucose POCT 103 mg/dL     Blood Culture - Venipuncture [[id number]]  Collected: 09/04/21 2344    Specimen: Blood from Venipuncture Updated: 09/06/21 1334     Culture Result --     Blood culture volume for this collection was less than the optimal 5 ml needed to achieve adequate sensitivity.  No Growth To Date      Blood Culture - Venipuncture [[id number]] Collected: 09/04/21 2215    Specimen: Blood from Venipuncture Updated: 09/06/21 1334     Culture Result No Growth To Date    MRSA Culture [[id number]]  (Abnormal) Collected: 09/05/21 0812    Specimen: Nares Updated: 09/06/21 1217     Isolate 1 Few  **Methicillin Resistant Staphylococcus aureus**  **MRSA isolated.**  Results called and read back by (licensed  clinician/date/time/tech):  RN JUNG LEE 12.17.22 1214 88      Dextrose Stick Glucose [[id number]]  (Abnormal) Collected: 09/06/21 1121    Specimen: Blood Updated: 09/06/21 1123     Glucose POCT 196 mg/dL             Recent CBC   Recent Labs     09/07/21  0541   RBC 3.78*   Hemoglobin 10.3*   Hematocrit 32.1*   MCV 85   MCH 27*   MCHC 32   RDW 16.2*   MPV 8.0     Recent BMP   Recent Labs     09/07/21  0541   Glucose 78   BUN 10   Creatinine 0.64*   Calcium 8.7   Sodium 138   Potassium 3.1*   Chloride 107   CO2 23       Lab Results   Component Value Date    WBC 8.0 09/07/2021    HGB 10.3 (L) 09/07/2021    PLT 292 09/07/2021    NA 138 09/07/2021    K 3.1 (L) 09/07/2021    BUN 10 09/07/2021    CREAT 0.64 (L) 09/07/2021    MG 1.7 09/07/2021    AST 7 (L) 09/07/2021    ALB 2.4 (L) 09/07/2021    INR 1.0 09/04/2021       Rads:   Radiological Procedure reviewed.    Assessment:   Limb threatening diabetic foot infection.  Nonclinical septic arthritis of the left fourth metatarsal phalangeal joint.  Diabetic ulcer left foot.  Cellulitis left foot not improving at this point.  Type 2 diabetes with neuropathy uncontrolled    Plan:   Patient was seen, examined and informed of findings and treatment plan.  Dressing changed today.  We will start wet-to-dry dressing changes on the plantar left foot wound.  I will go back and review his previous MRI.  May benefit from an MRI of the hindfoot.  Recommend changing vancomycin to linezolid due to persistent erythema and edema and warmth to the left foot.  Out of bed to chair ad lib. toe-touch weightbearing on the left    Will order MRI w and w/o contrast of the left heel to r/o deep abscess that was not noted on the MRI of the foot.            Signed by: Jannett Celestine, DPM

## 2021-09-07 NOTE — Progress Notes (Signed)
Pharmacy Vancomycin Dosing Consult Note  Arthur Kim    Assessment:   Indication: osteomyelitis, diabetes, SIRS  Day #3 vancomycin + cefepime for this 40 yo M who presented with worsening left foot pain. PMH includes prostatitis with indwelling foley catheter, DM, DKA, and recurring diabetic foot ulcerations. Recent hospitalization over a month ago for this infection and discharged on doxycycline and bactrim with no improvement.   WBC 8.0, Scr 0.64, Tmax 100 F. 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 12/16 L foot wound and tissue Cx - MRSA. 12/15 Bcx NGTD.      Plan:   Continue vancomycin 1500 mg IV Q12  Vancomycin Pharmacokinetic target: AUC24 (range) 400-600 mg/L/hr  Serum creatinine daily  Trough level at 0800 on 12/17 = 12.3.   MRSA nares (+)  Pharmacy will follow the patient's renal function, vancomycin levels, and dosing during the course of therapy. If you have any questions, please contact the pharmacist at [phone].      Age: 40 y.o.  Height: 1.753 m (5\' 9" )  Weight:  81.6 kg (180 lb)  IBW: 70.7 kg  DW: 75.1 kg     Baseline Population Estimated Kinetics: Individual Kinetics (Once Levels Resulted):   SCr: 0.73 mg/dL    CrCl: >045 ml/min    Vancomycin Clearance: 5.38 L/hr    Volume: 68.1 L  For patients with BMI >40, [facility] will assume a standard Volume of     t50 (half-life): 9.53 hours SCr: 0.64 mg/dL    CrCl:  >409 ml/min    Vancomycin Clearance:  6.16 L/hr    Volume:  63.4 L  For patients with BMI >40, [facility] will assume a standard Volume of 25 L    t50 (half-life): 7.91 hours     Expected AUC24 - steady state, Trough - steady state, and Risk of nephrotoxicity  Goal: AUC24,ss: 400 - 600 mg/L/hr  Goal: Probability of AUC24 > 400: Greater than 70%  Goal: Probability of nephrotoxicity: Less than 20%     12/18  Regimen: 1500 mg IV every 12 hours  Exposure target: AUC24 (range) 400-600 mg/L.hr   AUC24,ss: 489 mg/L.hr  Probability of AUC24 > 400: 84 %  Ctrough,ss: 13.5 mg/L  Probability of Ctrough,ss > 20: 10  %  Probability of nephrotoxicity (Lodise CID 2009): 9 %         Historic Patient Regimen   Date Regimen Weight SCr CrCl Trough Level               Recent Labs   Lab 09/07/21  0541 09/06/21  0330 09/05/21  0310 09/04/21  2345 09/04/21  2215   Creatinine 0.64* 0.73* 0.65*  --  0.83   Creatinine I-Stat  --   --   --  0.50*  --    BUN 10 7 5*  --  6*   WBC 8.0 8.2 9.9  --  11.5*       Temp (24hrs), Avg:98.7 F (37.1 C), Min:97.7 F (36.5 C), Max:100 F (37.8 C)    Patient Vitals for the past 12 hrs:   BP Temp Pulse Resp   09/07/21 0814 137/80 -- (!) 113 --   09/07/21 0738 132/84 97.9 F (36.6 C) (!) 111 20   09/07/21 0419 126/84 98.4 F (36.9 C) (!) 108 19   09/06/21 2330 135/81 100 F (37.8 C) 100 19          Microbiology Results (last 15 days)       Procedure Component Value Units  Date/Time    Tissue Culture and Smear [[id number]]  (Abnormal)  (Susceptibility) Collected: 09/05/21 1853    Order Status: Completed Specimen: Tissue from Left Foot Updated: 09/07/21 0705     Gram Stain --     No Epithelial Cells  Many WBC's Seen  Many Gram Positive Cocci  Moderate Gram Negative Rods  Occasional Gram Positive Rods       Isolate 1 Many  **Methicillin Resistant Staphylococcus aureus**  **MRSA isolated.**  Results called and read back by (licensed clinician/date/time/tech):  RN JUNG LEE 12.17.22 1214 88  Rifampin should not be used alone for antimicrobial therapy.      Susceptibility        **Methicillin Resistant Staphylococcus aureus**      BACTERIAL SUSCEPTIBILITY MIC (MCG/ML)     Clindamycin >=8  Resistant     Linezolid 2  Susceptible     Oxacillin >=4  Resistant     Rifampin <=0.5  Susceptible     Tetracycline >=16  Resistant     Trimethoprim/Sulfa >=320  Resistant     Vancomycin 1  Susceptible                          Anaerobic Culture [[id number]] Collected: 09/05/21 1853    Order Status: Completed Specimen: Tissue from Left Foot Updated: 09/06/21 0634     Culture Result No Anaerobes Isolated - Day 1, Reincubate     Wound Culture and Smear [[id number]]  (Abnormal) Collected: 09/05/21 6063    Order Status: Completed Specimen: Wound from Left Foot Updated: 09/07/21 0706     Gram Stain --     Few Epithelial Cells  Occasional WBC's Seen  No Organisms Seen       Isolate 1 Few  **Methicillin Resistant Staphylococcus aureus**  **MRSA isolated.**  Results called and read back by (licensed clinician/date/time/tech):  RN JUNG LEE 12.17.22 1214 88      MRSA Culture [[id number]]  (Abnormal) Collected: 09/05/21 0812    Order Status: Completed Specimen: Nares Updated: 09/06/21 1217     Isolate 1 Few  **Methicillin Resistant Staphylococcus aureus**  **MRSA isolated.**  Results called and read back by (licensed clinician/date/time/tech):  RN JUNG LEE 12.17.22 1214 88      Blood Culture - Venipuncture [[id number]] Collected: 09/04/21 2344    Order Status: Completed Specimen: Blood from Venipuncture Updated: 09/06/21 1334     Culture Result --     Blood culture volume for this collection was less than the optimal 5 ml needed to achieve adequate sensitivity.  No Growth To Date      Blood Culture - Venipuncture [[id number]] Collected: 09/04/21 2215    Order Status: Completed Specimen: Blood from Venipuncture Updated: 09/06/21 1334     Culture Result No Growth To Date

## 2021-09-07 NOTE — Progress Notes (Signed)
Telemedicine cross cover    Pt has 50 units lantus ordered. He only wants to take 25 units. Can I have a one time order for 25 units? I cannot give half a dose. Thanks!    - chart reviewed: BS have been running low /normal  - lantus 15 units once and monitor BS

## 2021-09-07 NOTE — Plan of Care (Signed)
Problem: Inadequate Cardiac Output  Goal: Adequate tissue perfusion will be maintained  Outcome: Progressing     Problem: Infection  Goal: Free from infection  Outcome: Progressing     Problem: Compromised skin integrity  Goal: Skin integrity is maintained or improved  Outcome: Progressing     Problem: Compromised Tissue integrity  Goal: Damaged tissue is healing and protected  Outcome: Progressing  Goal: Nutritional status is improving  Outcome: Progressing     Problem: Moderate/High Fall Risk Score >5  Goal: Patient will remain free of falls  Outcome: Progressing

## 2021-09-07 NOTE — Nursing Progress Note (Addendum)
Patient:  Arthur Kim, Arthur Kim, 40 y.o. 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]  Cellulitis of left foot [L03.116]  Elevated beta-hydroxybutyrate [R78.89]  Osteomyelitis of left foot, unspecified type [M86.9]  Diabetic ulcer of left midfoot associated with type 2 diabetes mellitus, unspecified ulcer stage [E11.621, L97.429]  Type 2 diabetes mellitus with hyperglycemia, unspecified whether long term insulin use [E11.65]    Events (last 24 hrs/overnight):        Nursing Shift Summary:     2045: Assumed care of patient at 1900. VSS and NSR - ST on tele. Pt c/o pain in LLE, will medicate per orders. No other needs at this time. Call bell in reach.      2100: Paged telemed r/t pt wanting to take 25 untis of lantus instead of ordered dose of 50 units. Will await return page     Vitals:      BP: 132/77 (09/07/2021  8:50 PM)  Heart Rate: (!) 105 (09/07/2021  8:50 PM)  Temp: 99.9 F (37.7 C) (09/07/2021  8:50 PM)  Resp Rate: 18 (09/07/2021  8:50 PM)   Oxygen Needs:    SpO2: 97 % (09/07/2021  8:50 PM)  O2 Device: None (Room air) (09/07/2021  8:50 PM)      Last 3 Weights:    Recent Weights for the past 720 hrs (Last 3 readings):   Weight   09/04/21 2131 81.6 kg (180 lb)       Tele:   Last order of [facility] TELEMET[facility] MONITOR[facility] was found on 09/07/2021 from Hospital Encounter on 09/04/2021         Cardiac Rhythm: Normal Sinus Rhythm; Sinus Tach     Drips:         Current Facility-Administered Medications   Medication Dose Last Admin      I/O: Last 24 hours:    UOP:  Foley/Drain LDA(s):       Intake/Output Summary (Last 24 hours) at 09/07/2021 2101  Last data filed at 09/07/2021 1728  Gross per 24 hour   Intake 960 ml   Output 1800 ml   Net -840 ml        Drain  Output:  Urethral Catheter (Active)   Catheter necessity reviewed? Yes 09/07/21 1728   Urine Output (mL) Total 300 mL 09/07/21 0500    No order of INSERT FOLEY CATHETER is found.     No order of EXTERNAL URINARY CATHETER is found.     If there is a catheter present (internal or external)   "Should we continue the catheter?"  []  Remove   []  Keep           Last BM: Last BM Date: 09/04/21       Unmeasured Output This Shift:    No data found.     VTE PPX:       Current Facility-Administered Medications (Includes Only Anticoagulants)   Medication Dose Route Frequency Provider Last Rate Last Admin    enoxaparin (LOVENOX) syringe 40 mg  40 mg Subcutaneous Q24H Alyacoub, Ramez I, MD           Skin Issues/Concerns:      Skin issues noted this shift:      Wound 09/05/21 Diabetic/ Neuropathy Foot Anterior;Left 4-63mm circular scabbed wound on top of  foot (Active)   09/05/21 0524 Foot   Wound Type: Diabetic/ Neuropathy   Pressure Injury Staging (WOCN/ Trained RNs Only):    Wound Location Orientation: Anterior;Left   Wound Description (Comments): 4-70mm circular scabbed wound on top of foot   Present on Admission: Yes   WOCN Wound Description (Comments):    Non-staged Wound Description:    Site Description Dressing covering site (UTA) 09/07/21 1000   Peri-wound Description Clean;Dry;Intact 09/07/21 1000       Wound 09/05/21 Diabetic/ Neuropathy Heel Left 1.5-2 inch in diameter open reddened/pink wound to pad of foot (Active)   09/05/21 0525 Heel   Wound Type: Diabetic/ Neuropathy   Pressure Injury Staging (WOCN/ Trained RNs Only):    Wound Location Orientation: Left   Wound Description (Comments): 1.5-2 inch in diameter open reddened/pink wound to pad of foot   Present on Admission: Yes   WOCN Wound Description (Comments):    Non-staged Wound Description:    Site Description Dressing covering site (UTA) 09/07/21 1000       Wound 09/05/21 Surgical Incision Foot Left 4x4 gauze soaked in saline, kling and ace bandage, clean and dry  (Active)   09/05/21 1855 Foot   Wound Type: Surgical Incision   Pressure Injury Staging (WOCN/ Trained RNs Only):    Wound Location Orientation: Left   Wound Description (Comments): 4x4 gauze soaked in saline, kling and ace bandage, clean and dry   Present on Admission:    WOCN Wound Description (Comments):    Non-staged Wound Description:    Site Description Dressing covering site (UTA) 09/07/21 1000   Closure Approximated 09/07/21 0920   Dressing Gauze Bandage Roll;Ace Wrap 09/07/21 0920   Dressing Changed New 09/05/21 1900   Dressing Status Clean;Dry;Intact 09/07/21 1000         Chem Sticks (Hyper/Hypo)       Glucose POCT   Date Value Ref Range Status   09/07/2021 136 (H) 71 - 99 mg/dL Final     Comment:     The above 1 analytes were performed by [facility] [phone])  [address],[place] [address]         Mobility:        PMP Activity: Step 4 - Dangle at Bedside (09/07/2021  8:00 AM)      Falls Risk:   Risk Category: High      PT/OT (recommendations):      No data recorded    Care Act Designee to care for patient after discharge:      Designate Care Provider?: Refused (09/05/2021  2:00 AM)      Care Partner(s) who will help during hospital stay:      No data recorded    Patient Rounding, Team Members Present    []  Rounds Occurred   []  Patient    []  MD    []  CM    []  Primary RN    []  Charge Nurse    []  Pharmacist      ("In a few words, as a patient, do you have any goals or concerns you would like to share  with the team today?" )     Patient stated goals and/or concerns:          Action plan:

## 2021-09-08 ENCOUNTER — Inpatient Hospital Stay: Payer: Medicare Other

## 2021-09-08 ENCOUNTER — Encounter: Payer: Self-pay | Admitting: Foot & Ankle Surgery

## 2021-09-08 LAB — CBC AND DIFFERENTIAL
Basophils %: 0.7 % (ref 0.0–3.0)
Basophils Absolute: 0.1 10*3/uL (ref 0.0–0.3)
Eosinophils %: 4.1 % (ref 0.0–7.0)
Eosinophils Absolute: 0.4 10*3/uL (ref 0.0–0.8)
Hematocrit: 36.7 % — ABNORMAL LOW (ref 39.0–52.5)
Hemoglobin: 11.7 gm/dL — ABNORMAL LOW (ref 13.0–17.5)
Lymphocytes Absolute: 1.6 10*3/uL (ref 0.6–5.1)
Lymphocytes: 19 % (ref 15.0–46.0)
MCH: 27 pg — ABNORMAL LOW (ref 28–35)
MCHC: 32 gm/dL (ref 32–36)
MCV: 85 fL (ref 80–100)
MPV: 8.4 fL (ref 6.0–10.0)
Monocytes Absolute: 0.9 10*3/uL (ref 0.1–1.7)
Monocytes: 10.7 % (ref 3.0–15.0)
Neutrophils %: 65.5 % (ref 42.0–78.0)
Neutrophils Absolute: 5.6 10*3/uL (ref 1.7–8.6)
PLT CT: 375 10*3/uL (ref 130–440)
RBC: 4.32 10*6/uL (ref 4.00–5.70)
RDW: 16.6 % — ABNORMAL HIGH (ref 11.0–14.0)
WBC: 8.6 10*3/uL (ref 4.0–11.0)

## 2021-09-08 LAB — VH DEXTROSE STICK GLUCOSE
Glucose POCT: 153 mg/dL — ABNORMAL HIGH (ref 71–99)
Glucose POCT: 193 mg/dL — ABNORMAL HIGH (ref 71–99)
Glucose POCT: 224 mg/dL — ABNORMAL HIGH (ref 71–99)
Glucose POCT: 229 mg/dL — ABNORMAL HIGH (ref 71–99)
Glucose POCT: 304 mg/dL — ABNORMAL HIGH (ref 71–99)

## 2021-09-08 LAB — COMPREHENSIVE METABOLIC PANEL
ALT: 9 U/L (ref 0–55)
AST (SGOT): 7 U/L — ABNORMAL LOW (ref 10–42)
Albumin/Globulin Ratio: 0.6 Ratio — ABNORMAL LOW (ref 0.80–2.00)
Albumin: 2.7 gm/dL — ABNORMAL LOW (ref 3.5–5.0)
Alkaline Phosphatase: 155 U/L — ABNORMAL HIGH (ref 40–145)
Anion Gap: 14 mMol/L (ref 7.0–18.0)
BUN / Creatinine Ratio: 14.7 Ratio (ref 10.0–30.0)
BUN: 10 mg/dL (ref 7–22)
Bilirubin, Total: 0.2 mg/dL (ref 0.1–1.2)
CO2: 26 mMol/L (ref 20–30)
Calcium: 9.2 mg/dL (ref 8.5–10.5)
Chloride: 109 mMol/L (ref 98–110)
Creatinine: 0.68 mg/dL — ABNORMAL LOW (ref 0.80–1.30)
EGFR: 122 mL/min/{1.73_m2} (ref 60–150)
Globulin: 4.5 gm/dL — ABNORMAL HIGH (ref 2.0–4.0)
Glucose: 114 mg/dL — ABNORMAL HIGH (ref 71–99)
Osmolality Calculated: 289 mOsm/kg (ref 275–300)
Potassium: 4 mMol/L (ref 3.5–5.3)
Protein, Total: 7.2 gm/dL (ref 6.0–8.3)
Sodium: 145 mMol/L (ref 136–147)

## 2021-09-08 LAB — MAGNESIUM: Magnesium: 1.8 mg/dL (ref 1.6–2.6)

## 2021-09-08 MED ORDER — VANCOMYCIN HCL 1 G IV SOLR
1750.0000 mg | Freq: Two times a day (BID) | INTRAVENOUS | Status: DC
Start: 2021-09-08 — End: 2021-09-08
  Filled 2021-09-08: qty 1750

## 2021-09-08 MED ORDER — VH DAPTOMYCIN 500 MG IV SOLR
6.0000 mg/kg | INTRAVENOUS | Status: DC
Start: 2021-09-08 — End: 2021-09-11
  Administered 2021-09-08 – 2021-09-11 (×4): 500 mg via INTRAVENOUS
  Filled 2021-09-08 (×4): qty 500

## 2021-09-08 MED ORDER — LANTUS SOLOSTAR 100 UNIT/ML SC SOPN
25.0000 [IU] | PEN_INJECTOR | Freq: Two times a day (BID) | SUBCUTANEOUS | Status: DC
Start: 2021-09-08 — End: 2021-09-09
  Administered 2021-09-08 – 2021-09-09 (×3): 25 [IU] via SUBCUTANEOUS
  Filled 2021-09-08: qty 3

## 2021-09-08 NOTE — Plan of Care (Addendum)
Patient:  Arthur Kim, Arthur Kim, 40 y.o. 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]  Cellulitis of left foot [L03.116]  Elevated beta-hydroxybutyrate [R78.89]  Osteomyelitis of left foot, unspecified type [M86.9]  Diabetic ulcer of left midfoot associated with type 2 diabetes mellitus, unspecified ulcer stage [E11.621, L97.429]  Type 2 diabetes mellitus with hyperglycemia, unspecified whether long term insulin use [E11.65]    Events (last 24 hrs/overnight): no events overnight       Nursing Shift Summary:      0830: Pt having 6/10 pain, Norco given at 0830.  Left foot wrapped,  right foot pulses +1. Pt refused 1 unit  humalog this am.  Pt was okay with receiving 25 units of lantus this am.  Assumed care of pt. White board updated and pt made aware of POC. IV intact. Call bell within reach and bed alarm set.  No additional needs at this time.      Vitals:      BP: 124/81 (09/08/2021  8:20 AM)  Heart Rate: (!) 116 (09/08/2021  8:20 AM)  Temp: 99.1 F (37.3 C) (09/08/2021  3:54 AM)  Resp Rate: 20 (09/08/2021  8:20 AM)   Oxygen Needs:    SpO2: 98 % (09/08/2021  8:20 AM)  O2 Device: None (Room air) (09/08/2021  8:20 AM)      Last 3 Weights:    Recent Weights for the past 720 hrs (Last 3 readings):   Weight   09/04/21 2131 81.6 kg (180 lb)       Tele: SR  Last order of [facility] TELEMETRY MONITORING was found on 09/07/2021 from Hospital Encounter on 09/04/2021         Cardiac Rhythm: Sinus Tach     Drips:         Current Facility-Administered Medications   Medication Dose Last Admin      I/O: Last 24 hours:    UOP:  Foley/Drain LDA(s):       Intake/Output Summary (Last 24 hours) at 09/08/2021 0942  Last data filed at 09/08/2021 0354  Gross per 24 hour   Intake  720 ml   Output 2075 ml   Net -1355 ml        Drain Output:  Urethral Catheter (Active)   Catheter necessity reviewed? Yes 09/07/21 2045   Urine Output (mL) Total 525 mL 09/07/21 2045    No order of INSERT FOLEY CATHETER is found.     No order of EXTERNAL URINARY CATHETER is found.     If there is a catheter present (internal or external)   Should we continue the catheter?  []  Remove   []  Keep           Last BM: Last BM Date: 09/05/21 (baseline per pt)       Unmeasured Output This Shift:    No data found.     VTE PPX:       Current Facility-Administered Medications (Includes Only Anticoagulants)   Medication Dose Route Frequency Provider Last Rate Last Admin    enoxaparin (LOVENOX) syringe 40 mg  40 mg Subcutaneous Q24H Alyacoub, Ramez I, MD           Skin Issues/Concerns:      Skin issues noted this shift:      Wound  09/05/21 Diabetic/ Neuropathy Foot Anterior;Left 4-67mm circular scabbed wound on top of foot (Active)   09/05/21 0524 Foot   Wound Type: Diabetic/ Neuropathy   Pressure Injury Staging (WOCN/ Trained RNs Only):    Wound Location Orientation: Anterior;Left   Wound Description (Comments): 4-12mm circular scabbed wound on top of foot   Present on Admission: Yes   WOCN Wound Description (Comments):    Non-staged Wound Description:    Site Description Dressing covering site (UTA) 09/07/21 2045   Peri-wound Description Clean;Dry;Intact 09/07/21 1000   Dressing Ace Wrap 09/07/21 2045   Dressing Status Clean;Dry;Intact 09/07/21 2045       Wound 09/05/21 Diabetic/ Neuropathy Heel Left 1.5-2 inch in diameter open reddened/pink wound to pad of foot (Active)   09/05/21 0525 Heel   Wound Type: Diabetic/ Neuropathy   Pressure Injury Staging (WOCN/ Trained RNs Only):    Wound Location Orientation: Left   Wound Description (Comments): 1.5-2 inch in diameter open reddened/pink wound to pad of foot   Present on Admission: Yes   WOCN Wound Description (Comments):    Non-staged Wound Description:    Site Description  Dressing covering site (UTA) 09/07/21 2045   Dressing Ace Wrap 09/07/21 2045   Dressing Status Clean;Dry;Intact 09/07/21 2045       Wound 09/05/21 Surgical Incision Foot Left 4x4 gauze soaked in saline, kling and ace bandage, clean and dry (Active)   09/05/21 1855 Foot   Wound Type: Surgical Incision   Pressure Injury Staging (WOCN/ Trained RNs Only):    Wound Location Orientation: Left   Wound Description (Comments): 4x4 gauze soaked in saline, kling and ace bandage, clean and dry   Present on Admission:    WOCN Wound Description (Comments):    Non-staged Wound Description:    Site Description Dressing covering site (UTA) 09/07/21 2045   Closure Approximated 09/07/21 0920   Dressing Gauze Bandage Roll;Ace Wrap 09/07/21 2045   Dressing Changed New 09/05/21 1900   Dressing Status Clean;Dry;Intact 09/07/21 2045         Chem Sticks (Hyper/Hypo)       Glucose POCT   Date Value Ref Range Status   09/08/2021 193 (H) 71 - 99 mg/dL Final     Comment:     The above 1 analytes were performed by [facility] [id number])  [address],[place] [address]         Mobility:        PMP Activity: Step 4 - Dangle at Bedside (09/07/2021  8:45 PM)      Falls Risk:   Risk Category: High      PT/OT (recommendations):      No data recorded    Care Act Designee to care for patient after discharge:      Designate Care Provider?: Refused (09/05/2021  2:00 AM)      Care Partner(s) who will help during hospital stay:      No data recorded    Patient Rounding, Team Members Present    [x]  Rounds Occurred   [x]  Patient    [x]  MD    [x]  CM    [x]  Primary RN    [x]  Charge Nurse    [x]  Pharmacist      (In a few words, as a patient, do you have any goals or concerns you would like to share  with the team today? )     Patient stated goals and/or concerns:          Action  plan:                       Problem: Inadequate Cardiac Output  Goal: Adequate tissue perfusion will be maintained  Outcome: Progressing     Problem:  Infection  Goal: Free from infection  Outcome: Progressing     Problem: Compromised skin integrity  Goal: Skin integrity is maintained or improved  Outcome: Progressing     Problem: Compromised Tissue integrity  Goal: Damaged tissue is healing and protected  Outcome: Progressing  Goal: Nutritional status is improving  Outcome: Progressing     Problem: Moderate/High Fall Risk Score >5  Goal: Patient will remain free of falls  Outcome: Progressing

## 2021-09-08 NOTE — Consults (Signed)
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  He has been followed in [place].  He was hospitalized for an extended stay with a PICC line for foot infection.  He was treated with doxycycline.  He also has urinary retention and has required a Foley catheter.  At this time he sought medical attention because of pain and swelling in his left foot.  His maximum temperature has been 38.  Initial white count 11.5.  Tissue from the foot is showing MRSA blood culture no growth.  He has a GFR of 122.  MRI shows a deep plantar ulcer extending to the bone in the left forefoot with septic arthritis of the left fourth MTPJ and osteomyelitis of the distal fourth metatarsal.  On 12/16 he underwent debridement by podiatry.  Surgical findings were discordant with the MRI findings so no amputation occurred.  He is receiving vancomycin and cefepime.    Review of Systems:   The patient's had issues with the acute renal failure.  He has type 1 diabetes.  He has known coronary artery disease and has had a prior stenting.  He had urinary retention and has had prostatitis with enlarged prostate requiring urethral catheter because of incomplete emptying.  He has no nausea vomiting or diarrhea.  No cough chest pain or productive sputum.  Foley catheter in place.  No fever chills or sweats    Past Medical History:     Past Medical History:   Diagnosis Date    Acute renal failure (ARF)     CAD (coronary artery disease)     CVA (cerebral vascular accident)     Diabetes mellitus type 1     Essential hypertension     Hx of cardiac cath     Unstable angina        Past Surgical History:     Past Surgical History:    Procedure Laterality Date    ABDOMINAL SURGERY      APPENDECTOMY (OPEN)      CARDIAC CATHETERIZATION  04/12/2019    CARDIAC CATHETERIZATION  03/16/2019    CHOLECYSTECTOMY           Allergies:   No Known Allergies    Social History:     Social History     Socioeconomic History    Marital status: Single   Tobacco Use    Smoking status: Unknown     Passive exposure: Past    Smokeless tobacco: Current     Types: Chew   Vaping Use    Vaping Use: Never used   Substance and Sexual Activity    Alcohol use: Never    Drug use: Never           Family History:   History reviewed. No pertinent family history.    Physical Exam:   Temp:  [98.4 F (36.9 C)-100 F (37.8 C)] 100 F (37.8 C)  Heart Rate:  [96-116] 112  Resp Rate:  [14-20] 17  BP: (115-132)/(67-84) 118/67    Wt Readings from Last 3 Encounters:   09/04/21 81.6 kg (180 lb)  Intake/Output Summary (Last 24 hours) at 09/08/2021 1309  Last data filed at 09/08/2021 1200  Gross per 24 hour   Intake 720 ml   Output 2100 ml   Net -1380 ml       Surgical dressing on the left foot this was not disrupted.  Preoperative images reviewed from 12/16 showing ulceration on the plantar surface with extensive lateral erythema there was also a small ulceration noted over the left fourth metatarsal head on the plantar and dorsal surface but no cellulitis.  Patient is alert oriented afebrile.  Speech is clear.  No facial asymmetry.  No herpetic lesions on his lips.  Conjunctiva clear.  Neck is supple without adenopathy or JVD.  Lungs clear no wheezing or rhonchi.  Heart rhythm regular no murmur rub.  Abdomen no hepatosplenomegaly guarding or rebound soft nontender.  Urethral catheter in place right foot shows no edema no pulse deficit but sensation is diminished.  No acrocyanosis    Meds:     Current Facility-Administered Medications   Medication Dose Route Frequency    aspirin  81 mg Oral Daily    cefepime  2 g Intravenous Q12H    colchicine  0.6 mg Oral Daily    enoxaparin  40 mg  Subcutaneous Q24H    gabapentin  300 mg Oral TID    insulin glargine  25 Units Subcutaneous Q12H    insulin lispro (1 Unit Dial)  1-9 Units Subcutaneous TID AC    And    insulin lispro (1 Unit Dial)  1-7 Units Subcutaneous QHS    isosorbide mononitrate  60 mg Oral Daily at 0600    metoprolol succinate XL  50 mg Oral Daily    sodium chloride (PF)  3 mL Intravenous Q12H [facility]    ticagrelor  90 mg Oral Q12H    vancomycin  1,750 mg Intravenous Q12H    vancomycin therapy placeholder   Does not apply See Admin Instructions     acetaminophen **OR** acetaminophen **OR** acetaminophen, dextrose, glucagon (rDNA), HYDROcodone-acetaminophen, HYDROmorphone, NSG Communication: Glucose POCT order (AC, HS) **AND** insulin lispro (1 Unit Dial) **AND** insulin lispro (1 Unit Dial) **AND** insulin lispro (1 Unit Dial), naloxone          Labs:     Labs (last 24 hours):  Results       Procedure Component Value Units Date/Time    Dextrose Stick Glucose [[id number]]  (Abnormal) Collected: 09/08/21 1053    Specimen: Blood Updated: 09/08/21 1055     Glucose POCT 224 mg/dL     Anaerobic Culture [[id number]] Collected: 09/05/21 1853    Specimen: Tissue from Left Foot Updated: 09/08/21 0940     Culture Result No Anaerobes Isolated - Day 3, Reincubate    Dextrose Stick Glucose [[id number]]  (Abnormal) Collected: 09/08/21 0822    Specimen: Blood Updated: 09/08/21 0824     Glucose POCT 193 mg/dL     Comprehensive metabolic panel [[id number]]  (Abnormal) Collected: 09/08/21 0441    Specimen: Plasma Updated: 09/08/21 0538     Sodium 145 mMol/L      Potassium 4.0 mMol/L      Chloride 109 mMol/L      CO2 26 mMol/L      Calcium 9.2 mg/dL      Glucose 629 mg/dL      Creatinine 5.28 mg/dL      BUN 10 mg/dL      Protein, Total 7.2 gm/dL      Albumin 2.7 gm/dL  Alkaline Phosphatase 155 U/L      ALT 9 U/L      AST (SGOT) 7 U/L      Bilirubin, Total 0.2 mg/dL      Albumin/Globulin Ratio 0.60 Ratio      Anion Gap 14.0 mMol/L      BUN / Creatinine Ratio 14.7  Ratio      EGFR 122 mL/min/1.37m2      Osmolality Calculated 289 mOsm/kg      Globulin 4.5 gm/dL     Magnesium [[id number]] Collected: 09/08/21 0441    Specimen: Plasma Updated: 09/08/21 0538     Magnesium 1.8 mg/dL     CBC and differential [[id number]]  (Abnormal) Collected: 09/08/21 0441    Specimen: Blood Updated: 09/08/21 0533     WBC 8.6 K/cmm      RBC 4.32 M/cmm      Hemoglobin 11.7 gm/dL      Hematocrit 78.2 %      MCV 85 fL      MCH 27 pg      MCHC 32 gm/dL      RDW 95.6 %      PLT CT 375 K/cmm      MPV 8.4 fL      Neutrophils % 65.5 %      Lymphocytes 19.0 %      Monocytes 10.7 %      Eosinophils % 4.1 %      Basophils % 0.7 %      Neutrophils Absolute 5.6 K/cmm      Lymphocytes Absolute 1.6 K/cmm      Monocytes Absolute 0.9 K/cmm      Eosinophils Absolute 0.4 K/cmm      Basophils Absolute 0.1 K/cmm     Dextrose Stick Glucose [[id number]]  (Abnormal) Collected: 09/08/21 0355    Specimen: Blood Updated: 09/08/21 0356     Glucose POCT 153 mg/dL     Dextrose Stick Glucose [[id number]]  (Abnormal) Collected: 09/07/21 2051    Specimen: Blood Updated: 09/07/21 2052     Glucose POCT 136 mg/dL     Dextrose Stick Glucose [[id number]]  (Abnormal) Collected: 09/07/21 1608    Specimen: Blood Updated: 09/07/21 1610     Glucose POCT 106 mg/dL     Dextrose Stick Glucose [[id number]]  (Abnormal) Collected: 09/07/21 1134    Specimen: Blood Updated: 09/07/21 1136     Glucose POCT 69 mg/dL     Dextrose Stick Glucose [[id number]]  (Abnormal) Collected: 09/07/21 0737    Specimen: Blood Updated: 09/07/21 0739     Glucose POCT 123 mg/dL     Wound Culture and Smear [[id number]]  (Abnormal) Collected: 09/05/21 2951    Specimen: Wound from Left Foot Updated: 09/07/21 0706     Gram Stain --     Few Epithelial Cells  Occasional WBC's Seen  No Organisms Seen       Isolate 1 Few  **Methicillin Resistant Staphylococcus aureus**  **MRSA isolated.**  Results called and read back by (licensed clinician/date/time/tech):  RN JUNG LEE 12.17.22 1214  88      Tissue Culture and Smear [[id number]]  (Abnormal)  (Susceptibility) Collected: 09/05/21 1853    Specimen: Tissue from Left Foot Updated: 09/07/21 0705     Gram Stain --     No Epithelial Cells  Many WBC's Seen  Many Gram Positive Cocci  Moderate Gram Negative Rods  Occasional Gram Positive Rods       Isolate 1 Many  **Methicillin Resistant Staphylococcus aureus**  **  MRSA isolated.**  Results called and read back by (licensed clinician/date/time/tech):  RN JUNG LEE 12.17.22 1214 88  Rifampin should not be used alone for antimicrobial therapy.      Comprehensive metabolic panel [[id number]]  (Abnormal) Collected: 09/07/21 0541    Specimen: Plasma Updated: 09/07/21 0638     Sodium 138 mMol/L      Potassium 3.1 mMol/L      Chloride 107 mMol/L      CO2 23 mMol/L      Calcium 8.7 mg/dL      Glucose 78 mg/dL      Creatinine 5.63 mg/dL      BUN 10 mg/dL      Protein, Total 6.1 gm/dL      Albumin 2.4 gm/dL      Alkaline Phosphatase 147 U/L      ALT 10 U/L      AST (SGOT) 7 U/L      Bilirubin, Total 0.2 mg/dL      Albumin/Globulin Ratio 0.65 Ratio      Anion Gap 11.1 mMol/L      BUN / Creatinine Ratio 15.6 Ratio      EGFR 124 mL/min/1.79m2      Osmolality Calculated 274 mOsm/kg      Globulin 3.7 gm/dL     Magnesium [[id number]] Collected: 09/07/21 0541    Specimen: Plasma Updated: 09/07/21 0638     Magnesium 1.7 mg/dL     CBC and differential [[id number]]  (Abnormal) Collected: 09/07/21 0541    Specimen: Blood Updated: 09/07/21 0604     WBC 8.0 K/cmm      RBC 3.78 M/cmm      Hemoglobin 10.3 gm/dL      Hematocrit 63.0 %      MCV 85 fL      MCH 27 pg      MCHC 32 gm/dL      RDW 16.0 %      PLT CT 292 K/cmm      MPV 8.0 fL      Neutrophils % 68.6 %      Lymphocytes 14.9 %      Monocytes 12.8 %      Eosinophils % 3.4 %      Basophils % 0.3 %      Neutrophils Absolute 5.5 K/cmm      Lymphocytes Absolute 1.2 K/cmm      Monocytes Absolute 1.0 K/cmm      Eosinophils Absolute 0.3 K/cmm      Basophils Absolute 0.0 K/cmm      Dextrose Stick Glucose [[id number]] Collected: 09/07/21 0537    Specimen: Blood Updated: 09/07/21 0548     Glucose POCT 75 mg/dL     Dextrose Stick Glucose [[id number]]  (Abnormal) Collected: 09/07/21 0409    Specimen: Blood Updated: 09/07/21 0411     Glucose POCT 61 mg/dL     Dextrose Stick Glucose [[id number]]  (Abnormal) Collected: 09/06/21 2121    Specimen: Blood Updated: 09/06/21 2123     Glucose POCT 154 mg/dL     Dextrose Stick Glucose [[id number]]  (Abnormal) Collected: 09/06/21 1627    Specimen: Blood Updated: 09/06/21 1628     Glucose POCT 103 mg/dL     Blood Culture - Venipuncture [[id number]] Collected: 09/04/21 2344    Specimen: Blood from Venipuncture Updated: 09/06/21 1334     Culture Result --     Blood culture volume for this collection was less than the optimal 5 ml needed to achieve adequate sensitivity.  No  Growth To Date      Blood Culture - Venipuncture [[id number]] Collected: 09/04/21 2215    Specimen: Blood from Venipuncture Updated: 09/06/21 1334     Culture Result No Growth To Date    MRSA Culture [[id number]]  (Abnormal) Collected: 09/05/21 0812    Specimen: Nares Updated: 09/06/21 1217     Isolate 1 Few  **Methicillin Resistant Staphylococcus aureus**  **MRSA isolated.**  Results called and read back by (licensed clinician/date/time/tech):  RN JUNG LEE 12.17.22 1214 88      Dextrose Stick Glucose [[id number]]  (Abnormal) Collected: 09/06/21 1121    Specimen: Blood Updated: 09/06/21 1123     Glucose POCT 196 mg/dL     Vancomycin, trough [[id number]] Collected: 09/06/21 0759    Specimen: Plasma Updated: 09/06/21 0910     Vancomycin Trough 12.3 mcg/mL     Dextrose Stick Glucose [[id number]]  (Abnormal) Collected: 09/06/21 0709    Specimen: Blood Updated: 09/06/21 0711     Glucose POCT 157 mg/dL     Comprehensive metabolic panel [[id number]]  (Abnormal) Collected: 09/06/21 0330    Specimen: Plasma Updated: 09/06/21 0504     Sodium 141 mMol/L      Potassium 3.4 mMol/L      Chloride 111 mMol/L      CO2 22  mMol/L      Calcium 8.3 mg/dL      Glucose 536 mg/dL      Creatinine 6.44 mg/dL      BUN 7 mg/dL      Protein, Total 6.1 gm/dL      Albumin 2.5 gm/dL      Alkaline Phosphatase 169 U/L      ALT 13 U/L      AST (SGOT) 9 U/L      Bilirubin, Total 0.2 mg/dL      Albumin/Globulin Ratio 0.69 Ratio      Anion Gap 11.4 mMol/L      BUN / Creatinine Ratio 9.6 Ratio      EGFR 119 mL/min/1.32m2      Osmolality Calculated 284 mOsm/kg      Globulin 3.6 gm/dL     Magnesium [[id number]] Collected: 09/06/21 0330    Specimen: Plasma Updated: 09/06/21 0504     Magnesium 1.9 mg/dL     CBC and differential [[id number]]  (Abnormal) Collected: 09/06/21 0330    Specimen: Blood Updated: 09/06/21 0501     WBC 8.2 K/cmm      RBC 3.59 M/cmm      Hemoglobin 9.7 gm/dL      Hematocrit 29.5 %      MCV 85 fL      MCH 27 pg      MCHC 32 gm/dL      RDW 18.8 %      PLT CT 265 K/cmm      MPV 8.5 fL      Neutrophils % 73.1 %      Lymphocytes 12.7 %      Monocytes 10.0 %      Eosinophils % 4.1 %      Basophils % 0.1 %      Neutrophils Absolute 6.0 K/cmm      Lymphocytes Absolute 1.0 K/cmm      Monocytes Absolute 0.8 K/cmm      Eosinophils Absolute 0.3 K/cmm      Basophils Absolute 0.0 K/cmm     Dextrose Stick Glucose [[id number]]  (Abnormal) Collected: 09/06/21 0425    Specimen: Blood Updated: 09/06/21 6010  Glucose POCT 199 mg/dL     Dextrose Stick Glucose [[id number]] Collected: 09/05/21 2119    Specimen: Blood Updated: 09/05/21 2120     Glucose POCT 83 mg/dL     Dextrose Stick Glucose [[id number]]  (Abnormal) Collected: 09/05/21 1937    Specimen: Blood Updated: 09/05/21 1939     Glucose POCT 144 mg/dL     Dextrose Stick Glucose [[id number]]  (Abnormal) Collected: 09/05/21 1911    Specimen: Blood Updated: 09/05/21 1912     Glucose POCT 68 mg/dL     Dextrose Stick Glucose [[id number]] Collected: 09/05/21 1654    Specimen: Blood Updated: 09/05/21 1655     Glucose POCT 86 mg/dL     Dextrose Stick Glucose [[id number]]  (Abnormal) Collected: 09/05/21 1127     Specimen: Blood Updated: 09/05/21 1128     Glucose POCT 151 mg/dL     Dextrose Stick Glucose [[id number]]  (Abnormal) Collected: 09/05/21 0723    Specimen: Blood Updated: 09/05/21 0724     Glucose POCT 193 mg/dL     Collect Blood [[id number]] Collected: 09/04/21 2153    Specimen: Other Updated: 09/05/21 0451     Collect Blood Label Notification    Dextrose Stick Glucose [[id number]]  (Abnormal) Collected: 09/05/21 0413    Specimen: Blood Updated: 09/05/21 0421     Glucose POCT 295 mg/dL     Comprehensive metabolic panel [[id number]]  (Abnormal) Collected: 09/05/21 0310    Specimen: Plasma Updated: 09/05/21 0412     Sodium 136 mMol/L      Potassium 3.3 mMol/L      Chloride 107 mMol/L      CO2 20 mMol/L      Calcium 7.8 mg/dL      Glucose 016 mg/dL      Creatinine 0.10 mg/dL      BUN 5 mg/dL      Protein, Total 6.3 gm/dL      Albumin 2.6 gm/dL      Alkaline Phosphatase 188 U/L      ALT 20 U/L      AST (SGOT) 23 U/L      Bilirubin, Total 0.4 mg/dL      Albumin/Globulin Ratio 0.70 Ratio      Anion Gap 12.3 mMol/L      BUN / Creatinine Ratio 7.7 Ratio      EGFR 124 mL/min/1.71m2      Osmolality Calculated 278 mOsm/kg      Globulin 3.7 gm/dL     Magnesium [[id number]] Collected: 09/05/21 0310    Specimen: Plasma Updated: 09/05/21 0412     Magnesium 2.0 mg/dL     Hemoglobin K0U [[id number]] Collected: 09/05/21 0310    Specimen: Blood Updated: 09/05/21 0356     Hgb A1C, % 8.7 %      Estimated Average Glucose 203 mg/dL     CBC and differential [[id number]]  (Abnormal) Collected: 09/05/21 0310    Specimen: Blood Updated: 09/05/21 0343     WBC 9.9 K/cmm      RBC 4.02 M/cmm      Hemoglobin 10.6 gm/dL      Hematocrit 16.0 %      MCV 86 fL      MCH 26 pg      MCHC 31 gm/dL      RDW 73.7 %      PLT CT 231 K/cmm      MPV 8.8 fL      Neutrophils % 73.9 %      Lymphocytes 13.3 %  Monocytes 9.2 %      Eosinophils % 3.2 %      Basophils % 0.4 %      Neutrophils Absolute 7.4 K/cmm      Lymphocytes Absolute 1.3 K/cmm      Monocytes Absolute  0.9 K/cmm      Eosinophils Absolute 0.3 K/cmm      Basophils Absolute 0.0 K/cmm     i-Stat Lactic AcID [[id number]] Collected: 09/04/21 2345    Specimen: Venipuncture Updated: 09/04/21 2349     Room Number I-Stat 11     Sample I-Stat Venous     Site I-Stat R Radial     Lactic Acid I-Stat 1.9 mMol/L     i-Stat Chem 8 CartrIDge [[id number]]  (Abnormal) Collected: 09/04/21 2345    Specimen: Blood Updated: 09/04/21 2348     Sodium I-Stat 137 mMol/L      Potassium I-Stat 3.3 mMol/L      Chloride I-Stat 104 mMol/L      TCO2 I-Stat 23 mMol/L      Calcium Ionized I-Stat 4.40 mg/dL      Glucose I-Stat 782 mg/dL      Creatinine I-Stat 0.50 mg/dL      BUN I-Stat 4 mg/dL      Anion Gap I-Stat 95.6     EGFR 134 mL/min/1.72m2      Hematocrit I-Stat 31.0 %      Hemoglobin I-Stat 10.5 gm/dL     Chem 8 plus only (i-STAT) [[id number]] Collected: 09/04/21 2338    Specimen: ISTAT Updated: 09/04/21 2341     I-STAT Notification Istat Notification    Lactic Acid I-Stat [[id number]] Collected: 09/04/21 2338    Specimen: ISTAT Updated: 09/04/21 2341     I-STAT Notification Istat Notification    CBC and differential [[id number]]  (Abnormal) Collected: 09/04/21 2215    Specimen: Blood Updated: 09/04/21 2257     WBC 11.5 K/cmm      RBC 4.01 M/cmm      Hemoglobin 11.0 gm/dL      Hematocrit 27.2 %      MCV 83 fL      MCH 28 pg      MCHC 33 gm/dL      RDW 53.6 %      PLT CT 259 K/cmm      MPV 9.1 fL      Neutrophils % 75.7 %      Lymphocytes 12.7 %      Monocytes 8.8 %      Eosinophils % 2.4 %      Basophils % 0.4 %      Neutrophils Absolute 8.7 K/cmm      Lymphocytes Absolute 1.5 K/cmm      Monocytes Absolute 1.0 K/cmm      Eosinophils Absolute 0.3 K/cmm      Basophils Absolute 0.1 K/cmm     Comprehensive metabolic panel [[id number]]  (Abnormal) Collected: 09/04/21 2215    Specimen: Plasma Updated: 09/04/21 2254     Sodium 134 mMol/L      Potassium 3.3 mMol/L      Chloride 104 mMol/L      CO2 18 mMol/L      Calcium 8.7 mg/dL      Glucose 595 mg/dL       Creatinine 6.38 mg/dL      BUN 6 mg/dL      Protein, Total 6.9 gm/dL      Albumin 3.0 gm/dL      Alkaline Phosphatase 160 U/L  ALT 13 U/L      AST (SGOT) 7 U/L      Bilirubin, Total 0.5 mg/dL      Albumin/Globulin Ratio 0.77 Ratio      Anion Gap 15.3 mMol/L      BUN / Creatinine Ratio 7.2 Ratio      EGFR 115 mL/min/1.60m2      Osmolality Calculated 284 mOsm/kg      Globulin 3.9 gm/dL     C Reactive Protein [[id number]]  (Abnormal) Collected: 09/04/21 2215    Specimen: Plasma Updated: 09/04/21 2253     C-Reactive Protein 16.70 mg/dL     Lipase [[id number]] Collected: 09/04/21 2215    Specimen: Plasma Updated: 09/04/21 2253     Lipase 8 U/L     Beta-Hydroxybutyrate [[id number]]  (Abnormal) Collected: 09/04/21 2215    Specimen: Plasma Updated: 09/04/21 2253     Betahydroxybutyrate 0.37 mMol/L     Sedimentation rate (ESR) [[id number]]  (Abnormal) Collected: 09/04/21 2215    Specimen: Blood Updated: 09/04/21 2253     Sed Rate 54 mm/hr     Magnesium [[id number]]  (Abnormal) Collected: 09/04/21 2215    Specimen: Plasma Updated: 09/04/21 2251     Magnesium 1.5 mg/dL     Prothrombin time/INR [[id number]] Collected: 09/04/21 2215    Specimen: Blood Updated: 09/04/21 2251     PT 10.7 sec      PT INR 1.0    Ethanol (Alcohol) Level [[id number]] Collected: 09/04/21 2215    Specimen: Plasma Updated: 09/04/21 2246     Alcohol <10 mg/dL     Urinalysis w Microscopic and Culture if Indicated [[id number]]  (Abnormal) Collected: 09/04/21 2215    Specimen: Urine, Random Updated: 09/04/21 2244     Color, UA Yellow     Clarity, UA Clear     Urine Specific Gravity 1.010     pH, Urine 6.0 pH      Protein, UR Trace mg/dL      Glucose, UA >=9323 mg/dL      Ketones UA Trace     Bilirubin, UA Negative mg/dL      Blood, UA Moderate mg/dL      Nitrite, UA Negative     Urobilinogen, UA 0.2 mg/dL      Leukocyte Esterase, UA Negative Leu/uL      UR Micro Performed     WBC, UA 4 /hpf      RBC, UA 6 /hpf      Squam Epithel, UA <1 /hpf     i-Stat Lactic  AcID [[id number]]  (Abnormal) Collected: 09/04/21 2229    Specimen: Venipuncture Updated: 09/04/21 2237     Room Number I-Stat 11     Sample I-Stat Venous     Site I-Stat R Radial     Lactic Acid I-Stat 2.5 mMol/L     i-Stat G3 Venous CartrIDge [[id number]]  (Abnormal) Collected: 09/04/21 2229    Specimen: Venipuncture Updated: 09/04/21 2232     pH, ISTAT 7.57     PO2, ISTAT 45 mm Hg      BE, ISTAT -1 mMol/L      HCO3, ISTAT 19.3 mMol/L      PCO2, ISTAT 21.3 mm Hg      O2 Sat, %, ISTAT 88 %      Room Number I-Stat 11     i-STAT Allen's Test N/A     DELS, ISTAT Room Air     i-STAT FIO2 0.00 %      Sample I-Stat Venous  Site I-Stat R Radial     TCO2 I-Stat 20 mMol/L      Operator, ISTAT Operator: [id number] Nita Sickle ED POCT    Lactic Acid I-Stat [[id number]] Collected: 09/04/21 2215    Specimen: ISTAT Updated: 09/04/21 2225     I-STAT Notification Istat Notification    Venous Blood Gas (VBG) (i-STAT) [[id number]] Collected: 09/04/21 2215    Specimen: ISTAT Updated: 09/04/21 2225     I-STAT Notification Istat Notification    Urine Drug Screen - No Confirmation [[id number]] Collected: 09/04/21 2215    Specimen: Urine, Random Updated: 09/04/21 2215    Dextrose Stick Glucose [[id number]]  (Abnormal) Collected: 09/04/21 2130    Specimen: Blood Updated: 09/04/21 2132     Glucose POCT 416 mg/dL             Imaging, reviewed and are significant for:  MRI Foot Left W WO Contrast   Final Result   1.  Diabetic left foot infection.   2.  Small deep plantar soft tissue ulcer extending to the bone in the left forefoot with acute septic arthritis of the left fourth MTP joint and acute osteomyelitis with bone destruction of the adjacent proximal phalanx of the left fourth toe and distal    fourth metatarsal bone.   3.  Reactive bone marrow edema or acute osteomyelitis without bone destruction of the heads of the left second and fourth metatarsals.      ReadingStation:[email or web address]      CT Abdomen Pelvis with IV Cont   Final Result      1.  There is mild circumferential wall thickening of the rectum and distal sigmoid colon with mild adjacent stranding. This could be related to nondistention or represent a focal proctitis. No free fluid or abnormal gas collections are noted. Large amount    of gas and stool are seen in the proximal colon.      2. Foley catheter is present within the urinary bladder. There is mild stranding associated with the prostate and seminal vesicles as well and cannot exclude acute inflammation.      3. Dependent soft tissue edema.         ReadingStation:[email or web address]      XR Foot Left AP Lateral And Oblique   Final Result   There is periostitis of the fourth metatarsal with suggestion of osteomyelitis of the fourth metatarsal head and possibly base of the fourth proximal phalanx. I suspect that there is septic arthritis of the fourth MTP joint.      There is cellulitis of the foot laterally and possible deep soft tissue gas.      ReadingStation:[email or web address]      MRI Heel Left W WO Contrast    (Results Pending)         Microbiology, reviewed and are significant for:  Microbiology Results (last 15 days)       Procedure Component Value Units Date/Time    Tissue Culture and Smear [[id number]]  (Abnormal)  (Susceptibility) Collected: 09/05/21 1853    Order Status: Completed Specimen: Tissue from Left Foot Updated: 09/07/21 0705     Gram Stain --     No Epithelial Cells  Many WBC's Seen  Many Gram Positive Cocci  Moderate Gram Negative Rods  Occasional Gram Positive Rods       Isolate 1 Many  **Methicillin Resistant Staphylococcus aureus**  **MRSA isolated.**  Results called and read back by (licensed clinician/date/time/tech):  RN JUNG LEE 12.17.22 1214 88  Rifampin should not be used alone  for antimicrobial therapy.      Susceptibility        **Methicillin Resistant Staphylococcus aureus**      BACTERIAL SUSCEPTIBILITY MIC (MCG/ML)     Clindamycin >=8  Resistant     Linezolid 2  Susceptible     Oxacillin >=4  Resistant     Rifampin  <=0.5  Susceptible     Tetracycline >=16  Resistant     Trimethoprim/Sulfa >=320  Resistant     Vancomycin 1  Susceptible                          Anaerobic Culture [[id number]] Collected: 09/05/21 1853    Order Status: Completed Specimen: Tissue from Left Foot Updated: 09/08/21 0940     Culture Result No Anaerobes Isolated - Day 3, Reincubate    Wound Culture and Smear [[id number]]  (Abnormal) Collected: 09/05/21 7829    Order Status: Completed Specimen: Wound from Left Foot Updated: 09/07/21 0706     Gram Stain --     Few Epithelial Cells  Occasional WBC's Seen  No Organisms Seen       Isolate 1 Few  **Methicillin Resistant Staphylococcus aureus**  **MRSA isolated.**  Results called and read back by (licensed clinician/date/time/tech):  RN JUNG LEE 12.17.22 1214 88      MRSA Culture [[id number]]  (Abnormal) Collected: 09/05/21 0812    Order Status: Completed Specimen: Nares Updated: 09/06/21 1217     Isolate 1 Few  **Methicillin Resistant Staphylococcus aureus**  **MRSA isolated.**  Results called and read back by (licensed clinician/date/time/tech):  RN JUNG LEE 12.17.22 1214 88      Blood Culture - Venipuncture [[id number]] Collected: 09/04/21 2344    Order Status: Completed Specimen: Blood from Venipuncture Updated: 09/06/21 1334     Culture Result --     Blood culture volume for this collection was less than the optimal 5 ml needed to achieve adequate sensitivity.  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  Appears to have soft tissue infection unclear if actual osteomyelitis or not as discordance between surgical findings and MRI scan.  Patient is on high-dose of vancomycin.  This is not practical as an outpatient.      Plan:   Stop cefepime and  stop vancomycin  Will begin daptomycin at 6 mg/kg.  Will order PICC line.  Will enter template for outpatient infusion services but will need to coordinate as he does not live in this area    Signed by: Rosiland Oz, MD

## 2021-09-08 NOTE — Progress Notes (Signed)
NURSE NOTE SUMMARY  [facility] [address]   Patient Name: Arthur Kim   Attending Physician: Claiborne Rigg, DO   Today's date:   09/08/2021 LOS: 3 days   Shift Summary:                                                              1910 Assumed care, pt is down in MRI at present time. Gevena Cotton, RN  1940 Pt back up from MRI, tele applied, no c/o voiced. Gevena Cotton, RN       Provider Notifications:        Rapid Response Notifications:  Mobility:      PMP Activity: Step 4 - Dangle at Bedside (09/08/2021  8:00 AM)     Weight tracking:  Family Dynamic:   No data found.          Last Bowel Movement   Last BM Date: 09/05/21 (baseline per pt)

## 2021-09-08 NOTE — Progress Notes (Addendum)
Medicine Progress Note   [facility]  [employer]   Patient Name: Arthur Kim, Arthur Kim LOS: 3 days   Attending Physician: Claiborne Rigg, DO PCP: Pcp, None, MD      Hospital Course:                                                            Arthur Kim is a 40 y.o. male patient with past medical history of prostatitis with indwelling Foley catheter, diabetes, diabetic ketoacidosis, diabetic foot ulcerations presented with worsening left foot pain . He states He has been with having diabetic ulcer with recurrent infections requiring greater than a month hospitalization currently on doxycycline and Bactrim. 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      In the ED he was medically stable afebrile remarkable for leukocytosis, elevated CRP ESR, elevated lactic acid 2.5 x-ray left foot showed possible osteomyelitis of the left fourth toe patient be admitted for further management.    Podiatry consulted.  Underwent left foot I&D on 12/16.     Assessment and Plan:      #Diabetic ulcer of the left foot with cellulitis:  #Possible osteomyelitis of the fourth left toe  #Possible septic arthritis of the left fourth MTP joint  #Lactic acidosis  -hemodynamically stable afebrile  -Xray There is periostitis of the fourth metatarsal with suggestion of osteomyelitis of the fourth metatarsal head and possibly base of the fourth proximal phalanx. I suspect that there is septic arthritis of the fourth MTP joint.  -MRI left foot showed Diabetic left foot infection. Small deep plantar soft tissue ulcer extending to the bone in the left forefoot with acute septic arthritis of the left  fourth MTP joint and acute osteomyelitis with bone destruction of the adjacent proximal phalanx of the left fourth toe and distal fourth metatarsal bone. Reactive bone marrow edema or acute osteomyelitis without bone destruction of the heads of the left second and fourth metatarsals.  -Blood cultures negative to date  -OR cultures growing MRSA  -continue vancomycin and cefepime  -pain control with norco prn + IV dilaudid prn severe pain  -may need further surgical intervention  -MRI left heel ordered  -ID consulted - Dr. Ovidio Kin     #Mild DKA present on admission  #Type 1 diabetes:  -hba1c: 8.7  -decrease lantus to 25 units q12  -SSI scale Accu-Cheks     #Hypokalemia  #Hypomagnesemia  -replete K  -repeat BMP in AM     #History of coronary disease status post PCI and stent  -Continue aspirin and Brilinta  -continue Toprol XL     #History of hypertension  -Continue Toprol XL and Imdur    #Prostatitis:  #BPH  -Has a Foley catheter in place due to enlarged prostate and incomplete bladder emptying   -will need outpatient urology follow up    Disposition: Inpatient  DVT PPX: Medication VTE Prophylaxis Orders: enoxaparin (LOVENOX) syringe 40 mg  Mechanical VTE Prophylaxis Orders: Mechanical VTE: Pneumatic Compression; Knee high  Code:  Full Code       Subjective     Patient seen and examined.  No overnight events.  Foot pain currently 4/10.      Objective   Physical Exam:     Vitals: T:99.1 F (37.3 C) (Temporal), BP:124/81, HR:(!) 116, RR:20, SaO2:98%    General: Patient is awake. In no acute distress.  HEENT: No conjunctival drainage, vision is intact, anicteric sclera.  Neck: Supple, no thyromegaly.  Chest: CTA bilaterally. No rhonchi, no wheezing. No use of accessory muscles.  CVS: Normal rate and regular rhythm no murmurs, without JVD, no pitting edema, pulses palpable.  Abdomen: Soft, non-tender, no guarding or rigidity, with normal bowel sounds.  Extremities: +left foot dressing  Skin: Warm, dry, no rash and no  worrisome lesions.  NEURO: No motor or sensory deficits.  Psychiatric: Alert, interactive, appropriate, normal affect.    Weight Monitoring 09/04/2021   Height 175.3 cm   Height Method Estimated   Weight 81.647 kg   Weight Method Estimated   BMI (calculated) 26.6 kg/m2           Intake/Output Summary (Last 24 hours) at 09/08/2021 0942  Last data filed at 09/08/2021 0354  Gross per 24 hour   Intake 720 ml   Output 2075 ml   Net -1355 ml       Body mass index is 26.58 kg/m.     Meds:     Current Facility-Administered Medications   Medication Dose Route Frequency    aspirin  81 mg Oral Daily    cefepime  2 g Intravenous Q12H    colchicine  0.6 mg Oral Daily    enoxaparin  40 mg Subcutaneous Q24H    gabapentin  300 mg Oral TID    insulin glargine  25 Units Subcutaneous Q12H    insulin lispro (1 Unit Dial)  1-9 Units Subcutaneous TID AC    And    insulin lispro (1 Unit Dial)  1-7 Units Subcutaneous QHS    isosorbide mononitrate  60 mg Oral Daily at 0600    metoprolol succinate XL  50 mg Oral Daily    sodium chloride (PF)  3 mL Intravenous Q12H [facility]    ticagrelor  90 mg Oral Q12H    vancomycin  1,500 mg Intravenous Q12H    vancomycin therapy placeholder   Does not apply See Admin Instructions       PRN Meds: acetaminophen **OR** acetaminophen **OR** acetaminophen, dextrose, glucagon (rDNA), HYDROcodone-acetaminophen, HYDROmorphone, NSG Communication: Glucose POCT order (AC, HS) **AND** insulin lispro (1 Unit Dial) **AND** insulin lispro (1 Unit Dial) **AND** insulin lispro (1 Unit Dial), naloxone.     LABS:     Estimated Creatinine Clearance: 147.3 mL/min (A) (based on SCr of 0.68 mg/dL (L)).  Recent Labs   Lab 09/08/21  0441 09/07/21  0541   WBC 8.6 8.0   RBC 4.32 3.78*   Hemoglobin 11.7* 10.3*   Hematocrit 36.7* 32.1*   MCV 85 85   PLT CT 375 292       Recent Labs   Lab 09/04/21  2215   PT 10.7   PT INR 1.0           Lab Results   Component Value Date    HGBA1CPERCNT 8.7 09/05/2021     Recent Labs   Lab 09/08/21  0441  09/07/21  0541 09/06/21  0330   Glucose 114* 78  181*   Sodium 145 138 141   Potassium 4.0 3.1* 3.4*   Chloride 109 107 111*   CO2 26 23 22    BUN 10 10 7    Creatinine 0.68* 0.64* 0.73*   EGFR 122 124 119   Calcium 9.2 8.7 8.3*       Recent Labs   Lab 09/08/21  0441 09/07/21  0541   Magnesium 1.8 1.7   Albumin 2.7* 2.4*   Protein, Total 7.2 6.1   Bilirubin, Total 0.2 0.2   Alkaline Phosphatase 155* 147*   ALT 9 10   AST (SGOT) 7* 7*       Recent Labs   Lab 09/04/21  2215   Urine Specific Gravity 1.010   pH, Urine 6.0   Protein, UR Trace*   Glucose, UA >=1000*   Ketones UA Trace*   Bilirubin, UA Negative   Blood, UA Moderate*   Nitrite, UA Negative   Urobilinogen, UA 0.2   Leukocyte Esterase, UA Negative   WBC, UA 4   RBC, UA 6*        Patient Lines/Drains/Airways Status       Active PICC Line / CVC Line / PIV Line / Drain / Airway / Intraosseous Line / Epidural Line / ART Line / Line / Wound / Pressure Ulcer / NG/OG Tube       Name Placement date Placement time Site Days    Peripheral IV 09/04/21 20 G Right Wrist 09/04/21  2146  Wrist  1    Urethral Catheter 09/05/21  0335  --  1    Non-Surgical Airway 09/05/21  1819  --  less than 1    Wound 09/05/21 Diabetic/ Neuropathy Foot Anterior;Left 4-35mm circular scabbed wound on top of foot 09/05/21  0524  Foot  1    Wound 09/05/21 Diabetic/ Neuropathy Heel Left 1.5-2 inch in diameter open reddened/pink wound to pad of foot 09/05/21  0525  Heel  1    Wound 09/05/21 Surgical Incision Foot Left 4x4 gauze soaked in saline, kling and ace bandage, clean and dry 09/05/21  1855  Foot  less than 1                   XR Foot Left AP Lateral And Oblique    Result Date: 09/04/2021  There is periostitis of the fourth metatarsal with suggestion of osteomyelitis of the fourth metatarsal head and possibly base of the fourth proximal phalanx. I suspect that there is septic arthritis of the fourth MTP joint. There is cellulitis of the foot laterally and possible deep soft tissue gas.  ReadingStation:[email or web address]    CT Abdomen Pelvis with IV Cont    Result Date: 09/05/2021  1. There is mild circumferential wall thickening of the rectum and distal sigmoid colon with mild adjacent stranding. This could be related to nondistention or represent a focal proctitis. No free fluid or abnormal gas collections are noted. Large amount  of gas and stool are seen in the proximal colon. 2. Foley catheter is present within the urinary bladder. There is mild stranding associated with the prostate and seminal vesicles as well and cannot exclude acute inflammation. 3. Dependent soft tissue edema. ReadingStation:[email or web address]    MRI Foot Left W WO Contrast    Result Date: 09/05/2021  1.  Diabetic left foot infection. 2.  Small deep plantar soft tissue ulcer extending to the bone in the left forefoot with acute septic arthritis of the left fourth MTP joint and acute osteomyelitis  with bone destruction of the adjacent proximal phalanx of the left fourth toe and distal fourth metatarsal bone. 3.  Reactive bone marrow edema or acute osteomyelitis without bone destruction of the heads of the left second and fourth metatarsals. ReadingStation:[email or web address]    Home Health Needs:  There are no questions and answers to display.       Nutrition assessment done in collaboration with Registered Dietitians:        Claiborne Rigg, DO     09/08/21,9:42 AM   MRN: [id number]                                      CSN: [id number] DOB: [birth year]

## 2021-09-08 NOTE — Progress Notes (Signed)
Pharmacy Vancomycin Dosing Consult Note  Arthur Kim    Assessment:   Indication: osteomyelitis, diabetes, SIRS  Day #4 vancomycin + cefepime for this 40 yo M who presented with worsening left foot pain. PMH includes prostatitis with indwelling foley catheter, DM, DKA, and recurring diabetic foot ulcerations. Recent hospitalization over a month ago for this infection and discharged on doxycycline and bactrim with no improvement. Per podiatry note, pt with persistent erythema, edema, and warmth of L foot.  Tmax 100 with WBC WNL. SCr stable and WNL. 12/16 L foot wound and tissue Cx growing MRSA. 12/15 Bcx NGTD. MRI of heel pending. Will increase dose given AUC in middle of therapeutic range and podiatry assessment of unsatisfactory wound healing. May need to decrease back to 1500 mg q12hr within 48-72 hours to avoid accumulation.     Plan:   Vancomycin 1750 mg IV q12hr  Vancomycin Pharmacokinetic target: AUC24 (range) 400-600 mg/L/hr  Serum creatinine daily  Trough level TBD  MRSA nares (+)  Pharmacy will follow the patient's renal function, vancomycin levels, and dosing during the course of therapy. If you have any questions, please contact the pharmacist at [phone]     Age: 40 y.o.  Height: 1.753 m (5\' 9" )  Weight:  81.6 kg (180 lb)  IBW: 70.7 kg  DW: 75.1 kg     Baseline Population Estimated Kinetics: Individual Kinetics (Once Levels Resulted):   SCr: 0.73 mg/dL    CrCl: >562 ml/min    Vancomycin Clearance: 5.38 L/hr    Volume: 68.1 L  For patients with BMI >40, [facility] will assume a standard Volume of     t50 (half-life): 9.53 hours SCr: 0.64 mg/dL    CrCl:  >130 ml/min    Vancomycin Clearance:  6.16 L/hr    Volume:  63.4 L  For patients with BMI >40, [facility] will assume a standard Volume of 25 L    t50 (half-life): 7.91 hours     Expected AUC24 - steady state, Trough - steady state, and Risk of nephrotoxicity  Goal: AUC24,ss: 400 - 600 mg/L/hr  Goal: Probability of AUC24 > 400: Greater than 70%  Goal:  Probability of nephrotoxicity: Less than 20%     12/19  AUC24,ss: 570 mg/L.hr  Probability of AUC24 > 400: 96 %  Ctrough,ss: 16.1 mg/L  Probability of Ctrough,ss > 20: 24 %  Probability of nephrotoxicity (Lodise CID 2009): 11 %           Historic Patient Regimen   Date Regimen Weight SCr CrCl Trough Level               Recent Labs   Lab 09/08/21  0441 09/07/21  0541 09/06/21  0330 09/05/21  0310   Creatinine 0.68* 0.64* 0.73* 0.65*   BUN 10 10 7  5*   WBC 8.6 8.0 8.2 9.9       Temp (24hrs), Avg:99.4 F (37.4 C), Min:98.4 F (36.9 C), Max:100 F (37.8 C)    Patient Vitals for the past 12 hrs:   BP Temp Pulse Resp   09/08/21 1050 118/67 100 F (37.8 C) (!) 112 17   09/08/21 0820 124/81 -- (!) 116 20   09/08/21 0354 118/84 99.1 F (37.3 C) (!) 105 16          Microbiology Results (last 15 days)       Procedure Component Value Units Date/Time    Tissue Culture and Smear [[id number]]  (Abnormal)  (Susceptibility) Collected: 09/05/21 1853  Order Status: Completed Specimen: Tissue from Left Foot Updated: 09/07/21 0705     Gram Stain --     No Epithelial Cells  Many WBC's Seen  Many Gram Positive Cocci  Moderate Gram Negative Rods  Occasional Gram Positive Rods       Isolate 1 Many  **Methicillin Resistant Staphylococcus aureus**  **MRSA isolated.**  Results called and read back by (licensed clinician/date/time/tech):  RN JUNG LEE 12.17.22 1214 88  Rifampin should not be used alone for antimicrobial therapy.      Susceptibility        **Methicillin Resistant Staphylococcus aureus**      BACTERIAL SUSCEPTIBILITY MIC (MCG/ML)     Clindamycin >=8  Resistant     Linezolid 2  Susceptible     Oxacillin >=4  Resistant     Rifampin <=0.5  Susceptible     Tetracycline >=16  Resistant     Trimethoprim/Sulfa >=320  Resistant     Vancomycin 1  Susceptible                          Anaerobic Culture [[id number]] Collected: 09/05/21 1853    Order Status: Completed Specimen: Tissue from Left Foot Updated: 09/08/21 0940     Culture  Result No Anaerobes Isolated - Day 3, Reincubate    Wound Culture and Smear [[id number]]  (Abnormal) Collected: 09/05/21 7829    Order Status: Completed Specimen: Wound from Left Foot Updated: 09/07/21 0706     Gram Stain --     Few Epithelial Cells  Occasional WBC's Seen  No Organisms Seen       Isolate 1 Few  **Methicillin Resistant Staphylococcus aureus**  **MRSA isolated.**  Results called and read back by (licensed clinician/date/time/tech):  RN JUNG LEE 12.17.22 1214 88      MRSA Culture [[id number]]  (Abnormal) Collected: 09/05/21 0812    Order Status: Completed Specimen: Nares Updated: 09/06/21 1217     Isolate 1 Few  **Methicillin Resistant Staphylococcus aureus**  **MRSA isolated.**  Results called and read back by (licensed clinician/date/time/tech):  RN JUNG LEE 12.17.22 1214 88      Blood Culture - Venipuncture [[id number]] Collected: 09/04/21 2344    Order Status: Completed Specimen: Blood from Venipuncture Updated: 09/06/21 1334     Culture Result --     Blood culture volume for this collection was less than the optimal 5 ml needed to achieve adequate sensitivity.  No Growth To Date      Blood Culture - Venipuncture [[id number]] Collected: 09/04/21 2215    Order Status: Completed Specimen: Blood from Venipuncture Updated: 09/06/21 1334     Culture Result No Growth To Date

## 2021-09-08 NOTE — Progress Notes (Signed)
Infectious Disease Medication Regimen for:  Arthur Kim, Arthur Kim  DOB:  May 08, 1981,  40 y.o.  MRN:  [id number]  PCP:   Pcp, None, MD   Infection Diagnosis MRSA diabetic foot infection    Drug 1 of  . Drug 2 of   . Drug 3 of  .   Antibiotic Name Daptomycin     Route Intravenous Infusion Intravenous Infusion Intravenous Infusion   Dose & Frequency   500mg  every  24hours.   every    hours.   every    hours.   Duration of treatment 28 days   days   days   Start Date 12/20     Labs Needed for monitoring   Weekly CBC, CK, CMP  Estimated Creatinine Clearance: 147.3 mL/min (A) (based on SCr of 0.68 mg/dL (L)).   ID Consult  Does patient need to be seen by ID prior to discharge?    - No   PICC line removal  Should PICC line be removed by Home Health after antibiotic therapy is completed?   - Yes if clinically responding.   ID Physician will follow the patient outside the hospital? Will be available for follow-up until he establishes follow-up with a local physician as he lives outside this area.   Follow up at         Rosiland Oz, MD  09/08/2021, 1:20 PM    Infectious Disease PhysicianStatement: By electronically signing my note, I affirm that this is a valid medical order that can be used outside the hospital.

## 2021-09-09 ENCOUNTER — Inpatient Hospital Stay: Payer: Medicare Other | Admitting: Certified Registered"

## 2021-09-09 ENCOUNTER — Inpatient Hospital Stay: Payer: Medicare Other

## 2021-09-09 ENCOUNTER — Encounter
Admission: EM | Disposition: A | Discharge status: Home Health | Payer: Self-pay | Source: Home / Self Care | Attending: Internal Medicine

## 2021-09-09 HISTORY — PX: DEBRIDEMENT & IRRIGATION, LOWER EXTREMITY: SHX3682

## 2021-09-09 LAB — BASIC METABOLIC PANEL
Anion Gap: 10.6 mMol/L (ref 7.0–18.0)
BUN / Creatinine Ratio: 13.5 Ratio (ref 10.0–30.0)
BUN: 10 mg/dL (ref 7–22)
CO2: 24 mMol/L (ref 20–30)
Calcium: 8.8 mg/dL (ref 8.5–10.5)
Chloride: 103 mMol/L (ref 98–110)
Creatinine: 0.74 mg/dL — ABNORMAL LOW (ref 0.80–1.30)
EGFR: 119 mL/min/{1.73_m2} (ref 60–150)
Glucose: 270 mg/dL — ABNORMAL HIGH (ref 71–99)
Osmolality Calculated: 277 mOsm/kg (ref 275–300)
Potassium: 3.6 mMol/L (ref 3.5–5.3)
Sodium: 134 mMol/L — ABNORMAL LOW (ref 136–147)

## 2021-09-09 LAB — VH DEXTROSE STICK GLUCOSE
Glucose POCT: 250 mg/dL — ABNORMAL HIGH (ref 71–99)
Glucose POCT: 265 mg/dL — ABNORMAL HIGH (ref 71–99)
Glucose POCT: 273 mg/dL — ABNORMAL HIGH (ref 71–99)
Glucose POCT: 247 mg/dL — ABNORMAL HIGH (ref 71–99)
Glucose POCT: 314 mg/dL — ABNORMAL HIGH (ref 71–99)
Glucose POCT: 209 mg/dL — ABNORMAL HIGH (ref 71–99)

## 2021-09-09 LAB — CBC AND DIFFERENTIAL
Basophils %: 0.2 % (ref 0.0–3.0)
Basophils Absolute: 0 10*3/uL (ref 0.0–0.3)
Eosinophils %: 2.4 % (ref 0.0–7.0)
Eosinophils Absolute: 0.3 10*3/uL (ref 0.0–0.8)
Hematocrit: 33.4 % — ABNORMAL LOW (ref 39.0–52.5)
Hemoglobin: 10.7 gm/dL — ABNORMAL LOW (ref 13.0–17.5)
Lymphocytes Absolute: 1.3 10*3/uL (ref 0.6–5.1)
Lymphocytes: 11.4 % — ABNORMAL LOW (ref 15.0–46.0)
MCH: 27 pg — ABNORMAL LOW (ref 28–35)
MCHC: 32 gm/dL (ref 32–36)
MCV: 84 fL (ref 80–100)
MPV: 7.9 fL (ref 6.0–10.0)
Monocytes Absolute: 1.2 10*3/uL (ref 0.1–1.7)
Monocytes: 10.4 % (ref 3.0–15.0)
Neutrophils %: 75.5 % (ref 42.0–78.0)
Neutrophils Absolute: 8.9 10*3/uL — ABNORMAL HIGH (ref 1.7–8.6)
PLT CT: 335 10*3/uL (ref 130–440)
RBC: 3.99 10*6/uL — ABNORMAL LOW (ref 4.00–5.70)
RDW: 16.5 % — ABNORMAL HIGH (ref 11.0–14.0)
WBC: 11.8 10*3/uL — ABNORMAL HIGH (ref 4.0–11.0)

## 2021-09-09 LAB — CK: Creatine Kinase (CK): 19 U/L — ABNORMAL LOW (ref 30–230)

## 2021-09-09 LAB — MAGNESIUM: Magnesium: 1.7 mg/dL (ref 1.6–2.6)

## 2021-09-09 SURGERY — DEBRIDEMENT AND IRRIGATION, LOWER EXTREMITY
Anesthesia: Anesthesia General | Laterality: Left | Wound class: Dirty or Infected

## 2021-09-09 MED ORDER — DROPERIDOL 2.5 MG/ML IJ SOLN
0.6250 mg | Freq: Once | INTRAMUSCULAR | Status: DC | PRN
Start: 2021-09-09 — End: 2021-09-09

## 2021-09-09 MED ORDER — OXYCODONE HCL 5 MG PO TABS
5.0000 mg | ORAL_TABLET | Freq: Once | ORAL | Status: AC | PRN
Start: 2021-09-09 — End: 2021-09-09
  Administered 2021-09-09: 5 mg via ORAL

## 2021-09-09 MED ORDER — FENTANYL CITRATE (PF) 50 MCG/ML IJ SOLN (WRAP)
INTRAMUSCULAR | Status: AC
Start: 2021-09-09 — End: ?
  Filled 2021-09-09: qty 2

## 2021-09-09 MED ORDER — FENTANYL CITRATE (PF) 50 MCG/ML IJ SOLN (WRAP)
25.0000 ug | INTRAMUSCULAR | Status: AC | PRN
Start: 2021-09-09 — End: 2021-09-09
  Administered 2021-09-09 (×4): 25 ug via INTRAVENOUS

## 2021-09-09 MED ORDER — MEPERIDINE HCL 25 MG/ML IJ SOLN
12.5000 mg | Freq: Once | INTRAMUSCULAR | Status: DC | PRN
Start: 2021-09-09 — End: 2021-09-09

## 2021-09-09 MED ORDER — HYDRALAZINE HCL 20 MG/ML IJ SOLN
10.0000 mg | INTRAMUSCULAR | Status: DC | PRN
Start: 2021-09-09 — End: 2021-09-09

## 2021-09-09 MED ORDER — LANTUS SOLOSTAR 100 UNIT/ML SC SOPN
30.0000 [IU] | PEN_INJECTOR | Freq: Two times a day (BID) | SUBCUTANEOUS | Status: DC
Start: 2021-09-09 — End: 2021-09-09

## 2021-09-09 MED ORDER — VH HYDROMORPHONE HCL PF 1 MG/ML CARPUJECT
0.5000 mg | INTRAMUSCULAR | Status: DC | PRN
Start: 2021-09-09 — End: 2021-09-09

## 2021-09-09 MED ORDER — PROCHLORPERAZINE EDISYLATE 10 MG/2ML IJ SOLN
5.0000 mg | Freq: Once | INTRAMUSCULAR | Status: DC | PRN
Start: 2021-09-09 — End: 2021-09-09

## 2021-09-09 MED ORDER — MIDAZOLAM HCL 1 MG/ML IJ SOLN (WRAP)
INTRAMUSCULAR | Status: DC | PRN
Start: 2021-09-09 — End: 2021-09-09
  Administered 2021-09-09: 2 mg via INTRAVENOUS

## 2021-09-09 MED ORDER — INSULIN LISPRO (1 UNIT DIAL) 100 UNIT/ML SC SOPN
1.0000 [IU] | PEN_INJECTOR | Freq: Every evening | SUBCUTANEOUS | Status: DC
Start: 2021-09-09 — End: 2021-09-11
  Administered 2021-09-09 – 2021-09-10 (×2): 2 [IU] via SUBCUTANEOUS

## 2021-09-09 MED ORDER — FENTANYL CITRATE (PF) 50 MCG/ML IJ SOLN (WRAP)
25.0000 ug | INTRAMUSCULAR | Status: DC | PRN
Start: 2021-09-09 — End: 2021-09-09
  Filled 2021-09-09: qty 2

## 2021-09-09 MED ORDER — VH PHENYLEPHRINE 120 MCG/ML IV BOLUS (ANESTHESIA)
PREFILLED_SYRINGE | INTRAVENOUS | Status: DC | PRN
Start: 2021-09-09 — End: 2021-09-09
  Administered 2021-09-09 (×3): 120 ug via INTRAVENOUS

## 2021-09-09 MED ORDER — PROPOFOL 200 MG/20ML IV EMUL
INTRAVENOUS | Status: AC
Start: 2021-09-09 — End: ?
  Filled 2021-09-09: qty 20

## 2021-09-09 MED ORDER — LANTUS SOLOSTAR 100 UNIT/ML SC SOPN
40.0000 [IU] | PEN_INJECTOR | Freq: Two times a day (BID) | SUBCUTANEOUS | Status: DC
Start: 2021-09-09 — End: 2021-09-11
  Administered 2021-09-09 – 2021-09-11 (×3): 40 [IU] via SUBCUTANEOUS

## 2021-09-09 MED ORDER — MIDAZOLAM HCL 1 MG/ML IJ SOLN (WRAP)
INTRAMUSCULAR | Status: AC
Start: 2021-09-09 — End: ?
  Filled 2021-09-09: qty 2

## 2021-09-09 MED ORDER — OXYCODONE HCL 5 MG PO TABS
5.0000 mg | ORAL_TABLET | Freq: Once | ORAL | Status: DC | PRN
Start: 2021-09-09 — End: 2021-09-09
  Administered 2021-09-09: 5 mg via ORAL
  Filled 2021-09-09: qty 1

## 2021-09-09 MED ORDER — INSULIN LISPRO (1 UNIT DIAL) 100 UNIT/ML SC SOPN
2.0000 [IU] | PEN_INJECTOR | Freq: Three times a day (TID) | SUBCUTANEOUS | Status: DC
Start: 2021-09-09 — End: 2021-09-11
  Administered 2021-09-09: 16:00:00 3 [IU] via SUBCUTANEOUS
  Administered 2021-09-09: 19:00:00 6 [IU] via SUBCUTANEOUS
  Administered 2021-09-10 – 2021-09-11 (×3): 2 [IU] via SUBCUTANEOUS
  Administered 2021-09-11: 12:00:00 4 [IU] via SUBCUTANEOUS
  Filled 2021-09-09: qty 3

## 2021-09-09 MED ORDER — PROPOFOL 200 MG/20ML IV EMUL
INTRAVENOUS | Status: DC | PRN
Start: 2021-09-09 — End: 2021-09-09
  Administered 2021-09-09: 200 mg via INTRAVENOUS

## 2021-09-09 MED ORDER — DIPHENHYDRAMINE HCL 50 MG/ML IJ SOLN
25.0000 mg | Freq: Four times a day (QID) | INTRAMUSCULAR | Status: DC | PRN
Start: 2021-09-09 — End: 2021-09-09

## 2021-09-09 MED ORDER — FENTANYL CITRATE (PF) 50 MCG/ML IJ SOLN (WRAP)
INTRAMUSCULAR | Status: DC | PRN
Start: 2021-09-09 — End: 2021-09-09
  Administered 2021-09-09: 25 ug via INTRAVENOUS
  Administered 2021-09-09: 50 ug via INTRAVENOUS

## 2021-09-09 MED ORDER — LACTATED RINGERS IV SOLN
INTRAVENOUS | Status: DC | PRN
Start: 2021-09-09 — End: 2021-09-09

## 2021-09-09 SURGICAL SUPPLY — 24 items
BANDAGE ESMARK 4IN  STERILE LF (Dressings) IMPLANT
BANDAGE KLING 3 STERILE (Dressings) IMPLANT
BANDAGE SOF BAND KERLIX 4.5 IN (Dressings) ×2 IMPLANT
BLADE SCALPEL #15 (Supply) ×6 IMPLANT
CONTAINER STER SPEC PEEL/PACK (Supply) IMPLANT
CUFF TOURNIQUET 24 (Supply) IMPLANT
CULTURETTE BBL (Supply) IMPLANT
DRSG XEROFORM 1 X 8 (Dressings) ×2 IMPLANT
GAUZE PLAIN PACKING 1 IN X 5Y (Dressings) IMPLANT
GLOVE BIOGEL UT PI SURG SZ 7 (Supply) ×2 IMPLANT
GOWN AURORA NONREINF STER LG (Supply) ×6 IMPLANT
NDL HYPO 25GA X 1.5 (Supply) ×2 IMPLANT
PACK EXTREMITY-LF (Supply) ×2 IMPLANT
PAD ABD STERILE 8 X 10 (Dressings) IMPLANT
SET STRYKER HANDPIECE NO TIP (Supply) ×2 IMPLANT
SHEET EXTREMITY T DRAPE (Supply) ×2 IMPLANT
SLEEVE ARM DISP SINGLE-USE (Supply) IMPLANT
SOL SALINE IRRIG 3000ML (Supply) ×2 IMPLANT
SOL SALINE IRRIG 500ML (Solutions) ×2 IMPLANT
SOL WATER STERILE IRRG 500ML (Supply) ×2 IMPLANT
SUT MONOCRYL 4-0 Y426H (Supply) ×2 IMPLANT
SYR CONTROL LL 10ML W/GRIP (Supply) ×2 IMPLANT
TOWEL BLUE STERILE 6 PER PK (Supply) ×4 IMPLANT
UNDERPAD BLUE 23X36  LF (Supply) ×2 IMPLANT

## 2021-09-09 NOTE — Progress Notes (Signed)
Medicine Progress Note   [facility]  [employer]   Patient Name: Arthur, Kim LOS: 4 days   Attending Physician: Claiborne Rigg, DO PCP: Pcp, None, MD      Hospital Course:                                                            Arthur Kim is a 40 y.o. male patient with past medical history of prostatitis with indwelling Foley catheter, diabetes, diabetic ketoacidosis, diabetic foot ulcerations presented with worsening left foot pain . He states He has been with having diabetic ulcer with recurrent infections requiring greater than a month hospitalization currently on doxycycline and Bactrim. 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      In the ED he was medically stable afebrile remarkable for leukocytosis, elevated CRP ESR, elevated lactic acid 2.5 x-ray left foot showed possible osteomyelitis of the left fourth toe.    Podiatry consulted.  Underwent left foot I&D on 12/16.  MRI of left heel shows abscess.  Plan for repeat I&D on 12/20.     Assessment and Plan:      #Diabetic ulcer of the left foot with cellulitis/abscess  #Possible osteomyelitis of the fourth left toe  #Possible septic arthritis of the left fourth MTP joint  #Lactic acidosis  -hemodynamically stable afebrile  -Xray There is periostitis of the fourth metatarsal with suggestion of osteomyelitis of the fourth metatarsal head and possibly base of the fourth proximal phalanx. I suspect that there is septic arthritis of the fourth MTP joint.  -MRI left foot showed Diabetic left foot infection. Small deep plantar soft tissue ulcer extending to the bone in the left forefoot with acute septic  arthritis of the left fourth MTP joint and acute osteomyelitis with bone destruction of the adjacent proximal phalanx of the left fourth toe and distal fourth metatarsal bone. Reactive bone marrow edema or acute osteomyelitis without bone destruction of the heads of the left second and fourth metatarsals.  -Blood cultures negative to date  -OR cultures growing MRSA  -antibiotics changed to Daptomycin - plan for 4 weeks  -pain control with norco prn + IV dilaudid prn severe pain  -MRI left heel shows abscess  -plan for repeat I&D on 12/20  -ID consulted - Dr. Ovidio Kin     #Mild DKA present on admission  #Type 1 diabetes:  -hba1c: 8.7  -increase lantus to 40 units Q12  -increase SSI to "medium"     #Hypokalemia  #Hypomagnesemia  -improved  -repeat BMP in AM     #History of coronary disease status post PCI and stent  -Continue aspirin and Brilinta  -continue Toprol XL     #History of hypertension  -Continue Toprol XL and Imdur    #Prostatitis:  #BPH  #Chronic urinary retention  -Has chronic Foley catheter in place due to enlarged prostate and incomplete bladder emptying   -will need outpatient urology  follow up    Disposition: Inpatient  DVT PPX: Medication VTE Prophylaxis Orders: enoxaparin (LOVENOX) syringe 40 mg  Mechanical VTE Prophylaxis Orders: Mechanical VTE: Pneumatic Compression; Knee high  Code:  Full Code       Subjective     Patient seen and examined.  No overnight events.  Pain controlled at this time.    Objective   Physical Exam:     Vitals: T:99.3 F (37.4 C) (Temporal), BP:117/67, HR:(!) 108, RR:16, SaO2:98%    General: Patient is awake. In no acute distress.  HEENT: No conjunctival drainage, vision is intact, anicteric sclera.  Neck: Supple, no thyromegaly.  Chest: CTA bilaterally. No rhonchi, no wheezing. No use of accessory muscles.  CVS: Normal rate and regular rhythm no murmurs, without JVD, no pitting edema, pulses palpable.  Abdomen: Soft, non-tender, no guarding or rigidity, with normal bowel  sounds.  Extremities: +left foot dressing  Skin: Warm, dry, no rash and no worrisome lesions.  NEURO: No motor or sensory deficits.  Psychiatric: Alert, interactive, appropriate, normal affect.    Weight Monitoring 09/04/2021   Height 175.3 cm   Height Method Estimated   Weight 81.647 kg   Weight Method Estimated   BMI (calculated) 26.6 kg/m2           Intake/Output Summary (Last 24 hours) at 09/09/2021 0937  Last data filed at 09/09/2021 0551  Gross per 24 hour   Intake 480 ml   Output 3475 ml   Net -2995 ml       Body mass index is 26.58 kg/m.     Meds:     Current Facility-Administered Medications   Medication Dose Route Frequency    aspirin  81 mg Oral Daily    colchicine  0.6 mg Oral Daily    DAPTOmycin (CUBICIN) IVPB  6 mg/kg Intravenous Q24H    enoxaparin  40 mg Subcutaneous Q24H    gabapentin  300 mg Oral TID    insulin glargine  25 Units Subcutaneous Q12H    insulin lispro (1 Unit Dial)  1-9 Units Subcutaneous TID AC    And    insulin lispro (1 Unit Dial)  1-7 Units Subcutaneous QHS    isosorbide mononitrate  60 mg Oral Daily at 0600    metoprolol succinate XL  50 mg Oral Daily    sodium chloride (PF)  3 mL Intravenous Q12H [facility]    ticagrelor  90 mg Oral Q12H       PRN Meds: acetaminophen **OR** acetaminophen **OR** acetaminophen, dextrose, glucagon (rDNA), HYDROcodone-acetaminophen, HYDROmorphone, NSG Communication: Glucose POCT order (AC, HS) **AND** insulin lispro (1 Unit Dial) **AND** insulin lispro (1 Unit Dial) **AND** insulin lispro (1 Unit Dial), naloxone.     LABS:     Estimated Creatinine Clearance: 135.3 mL/min (A) (based on SCr of 0.74 mg/dL (L)).  Recent Labs   Lab 09/09/21  0340 09/08/21  0441   WBC 11.8* 8.6   RBC 3.99* 4.32   Hemoglobin 10.7* 11.7*   Hematocrit 33.4* 36.7*   MCV 84 85   PLT CT 335 375       Recent Labs   Lab 09/04/21  2215   PT 10.7   PT INR 1.0       Recent Labs   Lab 09/09/21  0340   Creatine Kinase (CK) 19*     Lab Results   Component Value Date    HGBA1CPERCNT 8.7  09/05/2021     Recent Labs   Lab 09/09/21  0340 09/08/21  0441 09/07/21  0541   Glucose 270* 114* 78   Sodium 134* 145 138   Potassium 3.6 4.0 3.1*   Chloride 103 109 107   CO2 24 26 23    BUN 10 10 10    Creatinine 0.74* 0.68* 0.64*   EGFR 119 122 124   Calcium 8.8 9.2 8.7       Recent Labs   Lab 09/09/21  0340 09/08/21  0441 09/07/21  0541   Magnesium 1.7 1.8 1.7   Albumin  --  2.7* 2.4*   Protein, Total  --  7.2 6.1   Bilirubin, Total  --  0.2 0.2   Alkaline Phosphatase  --  155* 147*   ALT  --  9 10   AST (SGOT)  --  7* 7*       Recent Labs   Lab 09/04/21  2215   Urine Specific Gravity 1.010   pH, Urine 6.0   Protein, UR Trace*   Glucose, UA >=1000*   Ketones UA Trace*   Bilirubin, UA Negative   Blood, UA Moderate*   Nitrite, UA Negative   Urobilinogen, UA 0.2   Leukocyte Esterase, UA Negative   WBC, UA 4   RBC, UA 6*        Patient Lines/Drains/Airways Status       Active PICC Line / CVC Line / PIV Line / Drain / Airway / Intraosseous Line / Epidural Line / ART Line / Line / Wound / Pressure Ulcer / NG/OG Tube       Name Placement date Placement time Site Days    Peripheral IV 09/04/21 20 G Right Wrist 09/04/21  2146  Wrist  1    Urethral Catheter 09/05/21  0335  --  1    Non-Surgical Airway 09/05/21  1819  --  less than 1    Wound 09/05/21 Diabetic/ Neuropathy Foot Anterior;Left 4-55mm circular scabbed wound on top of foot 09/05/21  0524  Foot  1    Wound 09/05/21 Diabetic/ Neuropathy Heel Left 1.5-2 inch in diameter open reddened/pink wound to pad of foot 09/05/21  0525  Heel  1    Wound 09/05/21 Surgical Incision Foot Left 4x4 gauze soaked in saline, kling and ace bandage, clean and dry 09/05/21  1855  Foot  less than 1                   XR Foot Left AP Lateral And Oblique    Result Date: 09/04/2021  There is periostitis of the fourth metatarsal with suggestion of osteomyelitis of the fourth metatarsal head and possibly base of the fourth proximal phalanx. I suspect that there is septic arthritis of the fourth  MTP joint. There is cellulitis of the foot laterally and possible deep soft tissue gas. ReadingStation:[email or web address]    MRI Heel Left WO Contrast    Result Date: 09/08/2021  1.  Plantar skin and soft tissue ulcer at the mid-foot with packing material in place and a small amount of unencapsulated fluid extending from the base of the defect proximally over the medial cord of the plantar aponeurosis. 2.  Plantar skin defect centered over the fourth MTP joint with a small amount of unencapsulated fluid tracking towards the plantar plate. Additional, larger dorsal defect with sinus tract communicating with an approximately 21 mm fluid collection superficial to the third and fourth MTP joints. 3.  Effusion at the fourth MTP joint, and significant marrow signal abnormalities suggesting septic arthritis and  osteomyelitis. Probable pathologic nondisplaced fracture through the neck of the fourth metatarsal. 4.  Milder signal abnormalities at the second and third metatarsal heads and the third proximal phalanx, possibly reactive though early osteomyelitis is not excluded. Evidence of a nondisplaced fracture at the base of the third proximal phalanx.  ReadingStation:[email or web address]    CT Abdomen Pelvis with IV Cont    Result Date: 09/05/2021  1. There is mild circumferential wall thickening of the rectum and distal sigmoid colon with mild adjacent stranding. This could be related to nondistention or represent a focal proctitis. No free fluid or abnormal gas collections are noted. Large amount  of gas and stool are seen in the proximal colon. 2. Foley catheter is present within the urinary bladder. There is mild stranding associated with the prostate and seminal vesicles as well and cannot exclude acute inflammation. 3. Dependent soft tissue edema. ReadingStation:[email or web address]    MRI Foot Left W WO Contrast    Result Date: 09/05/2021  1.  Diabetic left foot infection. 2.  Small deep plantar soft tissue ulcer extending to the bone  in the left forefoot with acute septic arthritis of the left fourth MTP joint and acute osteomyelitis with bone destruction of the adjacent proximal phalanx of the left fourth toe and distal fourth metatarsal bone. 3.  Reactive bone marrow edema or acute osteomyelitis without bone destruction of the heads of the left second and fourth metatarsals. ReadingStation:[email or web address]    Home Health Needs:  There are no questions and answers to display.       Nutrition assessment done in collaboration with Registered Dietitians:        Claiborne Rigg, DO     09/09/21,9:37 AM   MRN: [id number]                                      CSN: [id number] DOB: 1981/07/13

## 2021-09-09 NOTE — Plan of Care (Signed)
Problem: Inadequate Cardiac Output  Goal: Adequate tissue perfusion will be maintained  Outcome: Progressing  Flowsheets (Taken 09/05/2021 0242 by Leighton Roach, RN)  Adequate tissue perfusion will be maintained:   Monitor/assess vital signs   Monitor/assess lab values and report abnormal values   Monitor/assess neurovascular status (pulses, capillary refill, pain, paresthesia, paralysis, presence of edema)   Monitor intake and output   Monitor for signs and symptoms of a pulmonary embolism (dyspnea, tachypnea, tachycardia, confusion)   Monitor/assess for signs of VTE (edema of calf/thigh redness, pain)   VTE Prevention: Administer anticoagulant(s) and/or apply anti-embolism stockings/devices as ordered   Encourage/assist patient as needed to turn, cough, and perform deep breathing every 2 hours     Problem: Infection  Goal: Free from infection  Outcome: Progressing  Flowsheets (Taken 09/09/2021 1703)  Free from infection:   Assess for signs/symptoms of infection   Utilize isolation precautions per protocol/policy   Assess immunization status   Consult/collaborate with Infection Preventionist   Utilize sepsis protocol     Problem: Compromised skin integrity  Goal: Skin integrity is maintained or improved  Outcome: Progressing  Flowsheets (Taken 09/05/2021 0242 by Leighton Roach, RN)  Skin integrity is maintained or improved:   Assess Braden Scale every shift   Turn or reposition patient every 2 hours or as needed unless able to reposition self   Increase activity as tolerated/progressive mobility   Relieve pressure to bony prominences   Avoid shearing   Encourage use of lotion/moisturizer on skin   Keep skin clean and dry   Monitor patient's hygiene practices   Collaborate with Wound, Ostomy, and Continence Nurse   Utilize specialty bed   Keep head of bed 30 degrees or less (unless contraindicated)

## 2021-09-09 NOTE — Anesthesia Preprocedure Evaluation (Addendum)
Anesthesia Evaluation    AIRWAY    Mallampati: II    TM distance: >3 FB  Neck ROM: full  Mouth Opening:full   CARDIOVASCULAR    cardiovascular exam normal       DENTAL         PULMONARY    pulmonary exam normal     OTHER FINDINGS    Very poor dentition overall            Relevant Problems   OTHER   (+) Osteomyelitis of left foot               Anesthesia Plan    ASA 4     general                             Post op pain management: per surgeon    informed consent obtained    Plan discussed with CRNA.      pertinent labs reviewed             Signed by: Elaina Pattee, MD 09/09/21 5:55 PM

## 2021-09-09 NOTE — Brief Op Note (Signed)
BRIEF OP NOTE    Date Time: 09/09/21 7:08 PM    Patient Name:   Arthur Kim    Date of Operation:   09/09/2021    Providers Performing:   Surgeon(s):  Jannett Celestine, DPM    Assistant (s):   Circulator: Faylene Kurtz, RN  Scrub Person: Pietro Cassis R    Operative Procedure:   Procedure(s):  I&D OF LEFT FOOT ABSCESS - Wound Class: Dirty or Infected     Preoperative Diagnosis:   Pre-Op Diagnosis Codes:     * Abscess of left foot [L02.612]    Postoperative Diagnosis:   Post-Op Diagnosis Codes:     * Abscess of left foot [L02.612]    Anesthesia:   Choice    Estimated Blood Loss:    5 mL    Implants:   * No implants in log *    Drains:   Drains: no    Specimens:     ID Type Source Tests Collected by Time Destination   1 : Left heel culture Tissue Left Heel [facility] CULTURE AND Norva Riffle,  Jannett Celestine, [facility] 09/09/2021 1829          Findings:   Soft tissue abscess plantar medial aspect of the left heel along the medial aspect of the plantar fascia no significant necrosis.    Complications:   None      Signed by: Jannett Celestine, DPM                                                                           [facility]

## 2021-09-09 NOTE — Op Note (Signed)
FULL OPERATIVE NOTE    Date Time: 09/09/21 7:09 PM  Patient Name: Arthur Kim  Attending Physician: Claiborne Rigg, DO      Date of Operation:   09/09/2021    Providers Performing:   Surgeon(s):  Jannett Celestine, DPM    Anesthesiologist: Elaina Pattee, MD  CRNA: Oda Kilts, CRNA    Circulator: Faylene Kurtz, RN  Scrub Person: Pietro Cassis R    Anesthesia/Sedation:   general    Operative Procedure:   Procedure(s):  I&D OF LEFT FOOT ABSCESS - Wound Class: Dirty or Infected     Preoperative Diagnosis:   Pre-Op Diagnosis Codes:     * Abscess of left foot [L02.612]    Postoperative Diagnosis:   Post-Op Diagnosis Codes:     * Abscess of left foot [L02.612]    Findings:   Deep space abscess plantar medial aspect of the left foot along the medial aspect of the plantar fascia.  No gross necrosis noted.    Indications:   Worsening diabetic foot infection with abscess per MRI    Operative Notes:   Under mild sedation as patient in the operating room placed the operating table in supine position.  Following induction of general anesthesia the appropriate timeout was performed identifying the operative side.  The left foot was scrubbed prepped and draped in usual aseptic manner.  Attention was directed to the medial aspect of the left heel where a 10.0 cm linear incision was made.  The incision was carried down to the level of the deep fascia with care being taken to identify retract all vital neurovascular structures.  At this point a small abscess was noted along the plantar medial aspect of the plantar fascia.  Drainage was yellow.  No gross tunneling noted into the medial ankle or the posterior heel.  No significant liquefactive necrotic tissue was noted.  The wound was then flushed with copious amounts of normal sterile saline.  The skin margins were then reapproximated coapted loosely with several simple interrupted 3-0 Prolene sutures.  Note that there was communication with the plantar central  foot wound.  No evidence of forefoot clinical infection noted at this time.  Tissue was sent to microbiology for culture and smear.  The wound was then dressed with Xeroform moist 4 x 4's and Kerlix and then secured with an Ace wrap.  Patient will be readmitted to the floor.  We will continue empiric IV antibiotics while we wait definitive cultures.  At this point I do not anticipate doing another return to the operating room.  We will start local wound care on the plantar wound in 2 days.  He will need 6 weeks of IV antibiotics.  Ready for discharge in 1 to 2 days.    Estimated Blood Loss:    5 mL    Implants:   * No implants in log *    Drains:   Drains: no    Specimens:     ID Type Source Tests Collected by Time Destination   1 : Left heel culture Tissue Left Heel [facility] CULTURE AND SMEAR, TISSUE Jannett Celestine, DPM 09/09/2021 1829          Complications:   * No complications entered in OR log *      Signed by: Alton Revere, MD

## 2021-09-09 NOTE — Progress Notes (Signed)
Patient:  Arthur Kim, Arthur Kim, 40 y.o. 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]  Cellulitis of left foot [L03.116]  Elevated beta-hydroxybutyrate [R78.89]  Osteomyelitis of left foot, unspecified type [M86.9]  Diabetic ulcer of left midfoot associated with type 2 diabetes mellitus, unspecified ulcer stage [E11.621, L97.429]  Type 2 diabetes mellitus with hyperglycemia, unspecified whether long term insulin use [E11.65]    Events (last 24 hrs/overnight):        Nursing Shift Summary:      0715 Assumed care from off going RN.  Patient in bed resting with all needs met. Tele has been d/c'd.  Call bell within reach. White board updated.    Vitals:      BP: 117/67 (09/09/2021  8:33 AM)  Heart Rate: (!) 108 (09/09/2021  8:33 AM)  Temp: 99.3 F (37.4 C) (09/09/2021  8:33 AM)  Resp Rate: 16 (09/09/2021  8:33 AM)   Oxygen Needs:    SpO2: 98 % (09/09/2021  8:33 AM)  O2 Device: None (Room air) (09/09/2021  3:41 AM)      Last 3 Weights:    Recent Weights for the past 720 hrs (Last 3 readings):   Weight   09/04/21 2131 81.6 kg (180 lb)       Tele:   Last order of [facility]H TELEMETRY MONITORING was found on 09/07/2021 from Hospital Encounter on 09/04/2021         Cardiac Rhythm: Sinus Tach (110-120)     Drips:         Current Facility-Administered Medications   Medication Dose Last Admin      I/O: Last 24 hours:    UOP:  Foley/Drain LDA(s):       Intake/Output Summary (Last 24 hours) at 09/09/2021 0937  Last data filed at 09/09/2021 0551  Gross per 24 hour   Intake 480 ml   Output 3475 ml   Net -2995 ml        Drain Output:  Urethral Catheter (Active)   Catheter necessity reviewed? Yes 09/08/21 2300   Urine Output (mL) Total 850 mL 09/09/21 0551    No order of  INSERT FOLEY CATHETER is found.     No order of EXTERNAL URINARY CATHETER is found.     If there is a catheter present (internal or external)   Should we continue the catheter?  []  Remove   [x]  Keep           Last BM: Last BM Date: 09/07/21       Unmeasured Output This Shift:    No data found.     VTE PPX:       Current Facility-Administered Medications (Includes Only Anticoagulants)   Medication Dose Route Frequency Provider Last Rate Last Admin    enoxaparin (LOVENOX) syringe 40 mg  40 mg Subcutaneous Q24H Alyacoub, Ramez I, MD           Skin Issues/Concerns:      Skin issues noted this shift:      Wound 09/05/21 Diabetic/ Neuropathy Foot Anterior;Left 4-3mm circular scabbed wound on top of foot (Active)   09/05/21 0524 Foot   Wound Type: Diabetic/ Neuropathy   Pressure Injury Staging (WOCN/ Trained RNs Only):    Wound Location Orientation: Anterior;Left   Wound Description (Comments): 4-54mm  circular scabbed wound on top of foot   Present on Admission: Yes   WOCN Wound Description (Comments):    Non-staged Wound Description:    Site Description Dressing covering site (UTA) 09/08/21 2300   Peri-wound Description Clean;Dry;Intact 09/07/21 1000   Dressing Ace Wrap 09/08/21 2300   Dressing Status Clean;Dry;Intact 09/08/21 2300       Wound 09/05/21 Diabetic/ Neuropathy Heel Left 1.5-2 inch in diameter open reddened/pink wound to pad of foot (Active)   09/05/21 0525 Heel   Wound Type: Diabetic/ Neuropathy   Pressure Injury Staging (WOCN/ Trained RNs Only):    Wound Location Orientation: Left   Wound Description (Comments): 1.5-2 inch in diameter open reddened/pink wound to pad of foot   Present on Admission: Yes   WOCN Wound Description (Comments):    Non-staged Wound Description:    Site Description Dressing covering site (UTA) 09/08/21 2300   Dressing Ace Wrap 09/08/21 2300   Dressing Status Clean;Dry;Intact 09/08/21 2300       Wound 09/05/21 Surgical Incision Foot Left 4x4 gauze soaked in saline, kling and ace  bandage, clean and dry (Active)   09/05/21 1855 Foot   Wound Type: Surgical Incision   Pressure Injury Staging (WOCN/ Trained RNs Only):    Wound Location Orientation: Left   Wound Description (Comments): 4x4 gauze soaked in saline, kling and ace bandage, clean and dry   Present on Admission:    WOCN Wound Description (Comments):    Non-staged Wound Description:    Site Description Dressing covering site (UTA) 09/08/21 2300   Closure Approximated 09/07/21 0920   Dressing Gauze Bandage Roll;Ace Wrap 09/08/21 2300   Dressing Changed New 09/05/21 1900   Dressing Status Clean;Dry;Intact 09/08/21 2300         Chem Sticks (Hyper/Hypo)       Glucose POCT   Date Value Ref Range Status   09/09/2021 265 (H) 71 - 99 mg/dL Final     Comment:     The above 1 analytes were performed by [facility] [phone])  [address],[place] [address]         Mobility:        PMP Activity: Step 4 - Dangle at Bedside (09/08/2021 11:59 PM)      Falls Risk:   Risk Category: Moderate      PT/OT (recommendations):      No data recorded    Care Act Designee to care for patient after discharge:      Designate Care Provider?: Refused (09/05/2021  2:00 AM)      Care Partner(s) who will help during hospital stay:      No data recorded    Patient Rounding, Team Members Present    [x]  Rounds Occurred   [x]  Patient    [x]  MD    [x]  CM    [x]  Primary RN    [x]  Charge Nurse    [x]  Pharmacist      (In a few words, as a patient, do you have any goals or concerns you would like to share  with the team today? )     Patient stated goals and/or concerns:          Action plan:

## 2021-09-09 NOTE — Transfer of Care (Signed)
Anesthesia Transfer of Care Note    Patient: Arthur Kim    Last vitals:   Vitals:    09/09/21 1858   BP: 92/48   Pulse: 72   Resp: 16   Temp: 36.3 C (97.3 F)   SpO2: 99%       Oxygen: Non-rebreather Mask     Mental Status:sedated    Airway: Oral Airway    Cardiovascular Status:  stable

## 2021-09-09 NOTE — Anesthesia Postprocedure Evaluation (Signed)
Anesthesia Post Evaluation    Patient: Arthur Kim    Procedure(s):  I&D OF LEFT FOOT ABSCESS    Anesthesia type: general    Last Vitals:   Vitals Value Taken Time   BP 134/84 09/09/21 2000   Temp 36.4 C (97.5 F) 09/09/21 2000   Pulse 97 09/09/21 2000   Resp 16 09/09/21 2000   SpO2 97 % 09/09/21 2000                 Anesthesia Post Evaluation:     Patient Evaluated: PACU  Patient Participation: complete - patient participated  Level of Consciousness: awake and alert    Pain Management: adequate    Airway Patency: patent    Anesthetic complications: No      PONV Status: controlled    Cardiovascular status: hemodynamically stable  Respiratory status: acceptable  Hydration status: stable  Comments:   Patient: Arthur Kim    Procedures performed: Procedure(s):  I&D OF LEFT FOOT ABSCESS    Patient location: PACU    Last vitals:  BP BP: 136/82 , Pulse Heart Rate: 99, RR Resp Rate: 18, Temperature Temp: 37.6 C (99.7 F), Pulse Oximetry SpO2 Readings from Last 1 Encounters:  09/09/21 : 99%        The patient continues to do well post-operatively.  The patient's pain is being properly treated.  The patient is hemodynamically stable and has met the discharge criteria from PACU.  There are no reportable events and no evidence of recall.    Cathie Beams DO  [id number]                       Signed by: Latina Craver, DO, 09/09/2021 9:27 PM

## 2021-09-09 NOTE — Plan of Care (Signed)
Problem: Inadequate Cardiac Output  Goal: Adequate tissue perfusion will be maintained  Outcome: Progressing  Flowsheets (Taken 09/05/2021 0242 by Leighton Roach, RN)  Adequate tissue perfusion will be maintained:   Monitor/assess vital signs   Monitor/assess lab values and report abnormal values   Monitor/assess neurovascular status (pulses, capillary refill, pain, paresthesia, paralysis, presence of edema)   Monitor intake and output   Monitor for signs and symptoms of a pulmonary embolism (dyspnea, tachypnea, tachycardia, confusion)   Monitor/assess for signs of VTE (edema of calf/thigh redness, pain)   VTE Prevention: Administer anticoagulant(s) and/or apply anti-embolism stockings/devices as ordered   Encourage/assist patient as needed to turn, cough, and perform deep breathing every 2 hours     Problem: Infection  Goal: Free from infection  Outcome: Progressing  Flowsheets (Taken 09/09/2021 0124)  Free from infection: Assess for signs/symptoms of infection     Problem: Compromised skin integrity  Goal: Skin integrity is maintained or improved  Outcome: Progressing  Flowsheets (Taken 09/05/2021 0242 by Leighton Roach, RN)  Skin integrity is maintained or improved:   Assess Braden Scale every shift   Turn or reposition patient every 2 hours or as needed unless able to reposition self   Increase activity as tolerated/progressive mobility   Relieve pressure to bony prominences   Avoid shearing   Encourage use of lotion/moisturizer on skin   Keep skin clean and dry   Monitor patient's hygiene practices   Collaborate with Wound, Ostomy, and Continence Nurse   Utilize specialty bed   Keep head of bed 30 degrees or less (unless contraindicated)     Problem: Moderate/High Fall Risk Score >5  Goal: Patient will remain free of falls  Outcome: Progressing  Flowsheets (Taken 09/08/2021 2359)  VH Moderate Risk (6-13):   ALL REQUIRED LOW INTERVENTIONS   INITIATE YELLOW "FALL RISK" SIGNAGE   YELLOW NON-SKID  SLIPPERS   YELLOW "FALL RISK" ARM BAND   USE OF BED EXIT ALARM IF PATIENT IS CONFUSED OR IMPULSIVE. PLACE RESET BED ALARM SIGN ABOVE BED   PLACE FALL RISK LEVEL ON WHITE BOARD FOR COMMUNICATION PURPOSES IN PATIENT'S ROOM   Include family/significant other in multidisciplinary discussion regarding plan of care as appropriate   Remain with patient during toileting   Use of "STOP ask for help" sign     Problem: Diabetes: Glucose Imbalance  Goal: Blood glucose stable at established goal  Outcome: Progressing  Flowsheets (Taken 09/09/2021 0124)  Blood glucose stable at established goal:   Monitor lab values   Monitor intake and output.  Notify LIP if urine output is < 30 mL/hour.   Follow fluid restrictions/IV/PO parameters   Include patient/family in decisions related to nutrition/dietary selections   Ensure adequate hydration

## 2021-09-09 NOTE — Progress Notes (Signed)
PROGRESS NOTE    Date Time: 09/09/21 5:27 PM  Patient Name: Arthur Kim, Arthur Kim      Subjective:   Follow-up left foot.  No improvement in the left heel.  MRI focused to the heel consistent with abscess deep space.  Patient currently n.p.o. ready to go to the OR for repeat incision and drainage  Patient relates no other concerns.  Medications:     Current Facility-Administered Medications   Medication Dose Route Frequency    aspirin  81 mg Oral Daily    colchicine  0.6 mg Oral Daily    DAPTOmycin (CUBICIN) IVPB  6 mg/kg Intravenous Q24H    enoxaparin  40 mg Subcutaneous Q24H    gabapentin  300 mg Oral TID    insulin glargine  40 Units Subcutaneous Q12H    insulin lispro (1 Unit Dial)  2-16 Units Subcutaneous TID AC    And    insulin lispro (1 Unit Dial)  1-9 Units Subcutaneous QHS and 0300    isosorbide mononitrate  60 mg Oral Daily at 0600    metoprolol succinate XL  50 mg Oral Daily    sodium chloride (PF)  3 mL Intravenous Q12H [facility]    ticagrelor  90 mg Oral Q12H         Physical Exam:     Vitals:    09/09/21 1602   BP: 116/68   Pulse: 96   Resp: 16   Temp: 98.2 F (36.8 C)   SpO2: 100%       Intake and Output Summary (Last 24 hours) at Date Time    Intake/Output Summary (Last 24 hours) at 09/09/2021 1727  Last data filed at 09/09/2021 0700  Gross per 24 hour   Intake 480 ml   Output 2500 ml   Net -2020 ml       General appearance - well appearing, and in no distress  Mental status - alert, oriented to person, place, and time, normal mood, behavior, speech, dress, motor activity, and thought processes  Neurological - alert, oriented, normal speech, n    Physical examination is unchanged.  The wound on the plantar aspect of the foot is stable.  Still no significant forefoot changes noted.  Left heel and medial ankle show persistent unchanged erythema.  Labs:     Results       Procedure Component Value Units Date/Time    Dextrose Stick Glucose [[id number]]  (Abnormal) Collected: 09/09/21 1602    Specimen: Blood Updated:  09/09/21 1604     Glucose POCT 247 mg/dL     Dextrose Stick Glucose [[id number]]  (Abnormal) Collected: 09/09/21 1232    Specimen: Blood Updated: 09/09/21 1235     Glucose POCT 273 mg/dL     Dextrose Stick Glucose [[id number]]  (Abnormal) Collected: 09/09/21 0832    Specimen: Blood Updated: 09/09/21 0835     Glucose POCT 265 mg/dL     Creatine Kinase (CK) [[id number]]  (Abnormal) Collected: 09/09/21 0340    Specimen: Plasma Updated: 09/09/21 0435     Creatine Kinase (CK) 19 U/L     Magnesium [[id number]] Collected: 09/09/21 0340    Specimen: Plasma Updated: 09/09/21 0428     Magnesium 1.7 mg/dL     Basic Metabolic Panel [[id number]]  (Abnormal) Collected: 09/09/21 0340    Specimen: Plasma Updated: 09/09/21 0428     Sodium 134 mMol/L      Potassium 3.6 mMol/L      Chloride 103 mMol/L      CO2  24 mMol/L      Calcium 8.8 mg/dL      Glucose 540 mg/dL      Creatinine 9.81 mg/dL      BUN 10 mg/dL      Anion Gap 19.1 mMol/L      BUN / Creatinine Ratio 13.5 Ratio      EGFR 119 mL/min/1.18m2      Osmolality Calculated 277 mOsm/kg     CBC and differential [[id number]]  (Abnormal) Collected: 09/09/21 0340    Specimen: Blood Updated: 09/09/21 0421     WBC 11.8 K/cmm      RBC 3.99 M/cmm      Hemoglobin 10.7 gm/dL      Hematocrit 30.8 %      MCV 84 fL      MCH 27 pg      MCHC 32 gm/dL      RDW 65.7 %      PLT CT 335 K/cmm      MPV 7.9 fL      Neutrophils % 75.5 %      Lymphocytes 11.4 %      Monocytes 10.4 %      Eosinophils % 2.4 %      Basophils % 0.2 %      Neutrophils Absolute 8.9 K/cmm      Lymphocytes Absolute 1.3 K/cmm      Monocytes Absolute 1.2 K/cmm      Eosinophils Absolute 0.3 K/cmm      Basophils Absolute 0.0 K/cmm     Dextrose Stick Glucose [[id number]]  (Abnormal) Collected: 09/09/21 0342    Specimen: Blood Updated: 09/09/21 0344     Glucose POCT 250 mg/dL     Dextrose Stick Glucose [[id number]]  (Abnormal) Collected: 09/08/21 2117    Specimen: Blood Updated: 09/08/21 2118     Glucose POCT 304 mg/dL             Recent CBC    Recent Labs     09/09/21  0340   RBC 3.99*   Hemoglobin 10.7*   Hematocrit 33.4*   MCV 84   MCH 27*   MCHC 32   RDW 16.5*   MPV 7.9     Recent BMP   Recent Labs     09/09/21  0340   Glucose 270*   BUN 10   Creatinine 0.74*   Calcium 8.8   Sodium 134*   Potassium 3.6   Chloride 103   CO2 24       Lab Results   Component Value Date    WBC 11.8 (H) 09/09/2021    HGB 10.7 (L) 09/09/2021    PLT 335 09/09/2021    NA 134 (L) 09/09/2021    K 3.6 09/09/2021    BUN 10 09/09/2021    CREAT 0.74 (L) 09/09/2021    MG 1.7 09/09/2021    AST 7 (L) 09/08/2021    ALB 2.7 (L) 09/08/2021    INR 1.0 09/04/2021       Rads:   Radiological Procedure reviewed.    Assessment:   Limb threatening diabetic foot infection.  Left heel abscess.  Type 2 diabetes uncontrolled  Status post incision and drainage and excisional wound debridement left foot.      Plan:   Patient was seen, examined and informed of findings and treatment plan.  To OR this evening for incision and drainage of the left heel deep abscess.  Continue empiric IV antibiotics.  Signed by: Janora Norlander, DPM

## 2021-09-09 NOTE — PACU (Signed)
Pt currently resting.  States pain has improved since PRN medications given.  O2 sat 99% on room air.  Pts blood sugar 314 on arrival to pacu.  Dr. Orvan Falconer notified and order given to continue sliding scale insulin.  VSS.  No s/s of distress.  Phase I criteria met.

## 2021-09-09 NOTE — UM Notes (Signed)
CONTINUED STAY REVIEW 09/09/2021    AUTH#:NAR      Arthur Kim  September 04, 1981  MRN: [id number]      MD NOTES:  12/16 OP NOTE     Operative Procedure:   Procedure(s):  DEBRIDEMENT & IRRIGATION OF LEFT FOOT - Wound Class: Dirty or Infected      Preoperative Diagnosis:   Pre-Op Diagnosis Codes:     * Diabetic ulcer of left foot associated with diabetes mellitus due to underlying condition, unspecified part of foot, unspecified ulcer stage [E08.621, L97.529]     * Osteomyelitis of left foot, unspecified type [M86.9]     Postoperative Diagnosis:   Post-Op Diagnosis Codes:     * Diabetic ulcer of left foot associated with diabetes mellitus due to underlying condition, unspecified part of foot, unspecified ulcer stage [E08.621, L97.529]     * Osteomyelitis of left foot, unspecified type [M86.9]     12/17 PODIATRY  Assessment:   Limb threatening diabetic foot infection.  Diabetic ulcer left foot.  Questionable septic arthritis to the left fourth metatarsal phalangeal joint only per MRI.  Clinical signs do not correlate at this time.  Cellulitis left foot     Plan:   Patient was seen, examined and informed of findings and treatment plan.  Patient is given 30 mg IV Toradol now.  Plan for dressing change tomorrow.  At this point am not overly concerned about the left fourth metatarsal phalangeal joint.  He does have an open wound there which we will watch.  There is no redness or gross infection noted to the fourth toe or fourth MTPJ at this time.  I am more concerned with the midfoot and hindfoot.  Plan for first dressing change tomorrow morning.  He is likely dealing with a multi microbial wound.  May need further surgery.  Even the MRI was unimpressive of the midfoot and hindfoot.  Out of bed to chair ad lib. from my standpoint.  Toe-touch weightbearing with walker.      12/18 MEDICINE  Diabetic ulcer of the left foot with cellulitis:  #Possible osteomyelitis of the fourth left toe  #Possible septic arthritis of the left fourth  MTP joint  #Lactic acidosis  -hemodynamically stable afebrile  -Xray There is periostitis of the fourth metatarsal with suggestion of osteomyelitis of the fourth metatarsal head and possibly base of the fourth proximal phalanx. I suspect that there is septic arthritis of the fourth MTP joint.  -MRI left foot showed Diabetic left foot infection. Small deep plantar soft tissue ulcer extending to the bone in the left forefoot with acute septic arthritis of the left fourth MTP joint and acute osteomyelitis with bone destruction of the adjacent proximal phalanx of the left fourth toe and distal fourth metatarsal bone. Reactive bone marrow edema or acute osteomyelitis without bone destruction of the heads of the left second and fourth metatarsals.  -Blood cultures negative to date  -OR cultures growing MRSA  -continue vancomycin and cefepime  -pain control with norco prn + change to IV dilaudid prn severe pain  -may need further surgical intervention  -MRI left heel pending  -will need ID consult     12/19 ID  Assessment:   Type 1 diabetes with diabetic foot ulcer growing MRSA status postdebridement.  Appears to have soft tissue infection unclear if actual osteomyelitis or not as discordance between surgical findings and MRI scan.  Patient is on high-dose of vancomycin.  This is not practical as an outpatient.  Plan:   Stop cefepime and stop vancomycin  Will begin daptomycin at 6 mg/kg.  Will order PICC line.  Will enter template for outpatient infusion services but will need to coordinate as he does not live in this area      Recent Labs   Lab 09/09/21  0340   WBC 11.8*   Hemoglobin 10.7*   Hematocrit 33.4*   PLT CT 335          Recent Labs   Lab 09/09/21  0340   Sodium 134*   Potassium 3.6   Chloride 103   CO2 24   BUN 10   Creatinine 0.74*   EGFR 119   Glucose 270*   Calcium 8.8          VITALS: Blood pressure 117/67, pulse (!) 108, temperature 99.3 F (37.4 C), temperature source Temporal, resp. rate 16, height  1.753 m (5\' 9" ), weight 81.6 kg (180 lb), SpO2 98 %.      Scheduled Meds:  Current Facility-Administered Medications   Medication Dose Route Frequency    aspirin  81 mg Oral Daily    colchicine  0.6 mg Oral Daily    DAPTOmycin (CUBICIN) IVPB  6 mg/kg Intravenous Q24H    enoxaparin  40 mg Subcutaneous Q24H    gabapentin  300 mg Oral TID    insulin glargine  30 Units Subcutaneous Q12H    insulin lispro (1 Unit Dial)  1-9 Units Subcutaneous TID AC    And    insulin lispro (1 Unit Dial)  1-7 Units Subcutaneous QHS    isosorbide mononitrate  60 mg Oral Daily at 0600    metoprolol succinate XL  50 mg Oral Daily    sodium chloride (PF)  3 mL Intravenous Q12H [facility]    ticagrelor  90 mg Oral Q12H     Continuous Infusions:  PRN Meds:.acetaminophen **OR** acetaminophen **OR** acetaminophen, dextrose, glucagon (rDNA), HYDROcodone-acetaminophen, HYDROmorphone, NSG Communication: Glucose POCT order (AC, HS) **AND** insulin lispro (1 Unit Dial) **AND** insulin lispro (1 Unit Dial) **AND** insulin lispro (1 Unit Dial), naloxone            Griffith Citron BSN, RN  Utilization Management Review Nurse  [facility] [address]  [address]  [place], [place] [address]  [phone] office Phone, Direct and Confidential  [phone], Fax  [email or web address]

## 2021-09-09 NOTE — Progress Notes (Signed)
Readmission Risk  [facility] [address]   Patient Name: Arthur Kim   Attending Physician: Claiborne Rigg, DO   Today's date:   09/09/2021 LOS: 4 days   Expected Discharge Date      Readmission Assessment:                                                              Discharge Planning  ReAdmit Risk Score: 12.18  Does the patient have perscription coverage?: Yes  Utilize [place] [facility]: No  Confirmed PCP with Pt: No  Confirmed PCP name: Attempting to confirm.  Confirm Transport to F/U Appt.: Self/Private Vehicle/Friend  Anticipated Home Health at [place]: Yes      09/09/21 RNCM (PH) ADM: s/p I&D L foot 4th toe and MPJ on 09/05/21 2 diabetic foot ulcer. MRI reveals L heel abscess, plan to return to OR onn 12/20 for I&D. Not medically ready for discharge at this time.  Discharge plan discussed w/pt states he is already w/[facility] out of [place], [place].  Contacted agency and left VMM- [phone], states he also has wound vac and home health has been changing wound MWF-although he did later state he was not wearing wound vac. Also states he has PCP-Dr. Renaldo Kim in [facility] (unable to located).  Will need name and number of provider for home health.  Dr. Ovidio Kin for ID consulted-antibiotic grid placed-will need 4 weeks of IV ABX. PICC line placed.      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  Has been with this company for less than 6 months (no FMLA protection). Lives near [place], [place].   PTA: lives alone in home, indep ADL/amb. Drives, has PCP and RX Coverage.  BARRIERS: foot abscess, lives alone.   NEEDS: PCP name/number, MD signing home health orders.  Home Health referral and Home Infusion when pt is medically ready for discharge.   RNCM will continue to follow.      IDPA:   Patient Type  Within 30 Days of Previous Admission?: No  Healthcare Decisions  Interviewed:: Patient  Orientation/Decision Making Abilities of Patient: Alert  and Oriented x3, able to make decisions  Advance Directive: Patient does not have advance directive  Healthcare Agent Appointed: No  Prior to admission  Type of Residence: Private residence  Home Layout: One level  Have running water, electricity, heat, etc?: Yes  Living Arrangements: Alone  How do you get to your MD appointments?: self  How do you get your groceries?: self  Who fixes your meals?: self  Who does your laundry?: self  Who picks up your prescriptions?: self  Dressing: Independent  Grooming: Independent  Feeding: Independent  Bathing: Independent  Toileting: Independent  DME Currently at Home: ADL- Shower Chair, Glucometer, Scale (Wound VAC)  Home Care/Community Services: Home Health  Name of Agency: [facility]---[phone]  Frequency of services: MWF for wound vac changes.  Discharge Planning  Support Systems: Family members  Patient expects to be discharged to:: home w/home health.  Anticipated [place] plan discussed with:: Same as interviewed  Mode of transportation:: Private car (family member)  Does the patient have perscription coverage?: Yes  Consults/Providers  PT Evaluation Needed: Yes (Comment)  OT Evalulation Needed: Yes (  Comment)  SLP Evaluation Needed: No  Family and PCP  PCP on file was verified as the current PCP?: Provider not on file   30 Day Readmission:       Provider Notifications:        [name], RN MSN  Case Management  Ph: [phone]  Fax: [phone]

## 2021-09-10 ENCOUNTER — Encounter: Payer: Self-pay | Admitting: Foot & Ankle Surgery

## 2021-09-10 LAB — CBC AND DIFFERENTIAL
Basophils %: 0.1 % (ref 0.0–3.0)
Basophils Absolute: 0 10*3/uL (ref 0.0–0.3)
Eosinophils %: 2 % (ref 0.0–7.0)
Eosinophils Absolute: 0.2 10*3/uL (ref 0.0–0.8)
Hematocrit: 31.3 % — ABNORMAL LOW (ref 39.0–52.5)
Hemoglobin: 10.2 gm/dL — ABNORMAL LOW (ref 13.0–17.5)
Lymphocytes Absolute: 1.4 10*3/uL (ref 0.6–5.1)
Lymphocytes: 13.7 % — ABNORMAL LOW (ref 15.0–46.0)
MCH: 27 pg — ABNORMAL LOW (ref 28–35)
MCHC: 32 gm/dL (ref 32–36)
MCV: 84 fL (ref 80–100)
MPV: 8 fL (ref 6.0–10.0)
Monocytes Absolute: 0.9 10*3/uL (ref 0.1–1.7)
Monocytes: 8.4 % (ref 3.0–15.0)
Neutrophils %: 75.8 % (ref 42.0–78.0)
Neutrophils Absolute: 7.8 10*3/uL (ref 1.7–8.6)
PLT CT: 366 10*3/uL (ref 130–440)
RBC: 3.73 10*6/uL — ABNORMAL LOW (ref 4.00–5.70)
RDW: 16.6 % — ABNORMAL HIGH (ref 11.0–14.0)
WBC: 10.3 10*3/uL (ref 4.0–11.0)

## 2021-09-10 LAB — BASIC METABOLIC PANEL
Anion Gap: 13.6 mMol/L (ref 7.0–18.0)
BUN / Creatinine Ratio: 13.5 Ratio (ref 10.0–30.0)
BUN: 10 mg/dL (ref 7–22)
CO2: 23 mMol/L (ref 20–30)
Calcium: 8.8 mg/dL (ref 8.5–10.5)
Chloride: 107 mMol/L (ref 98–110)
Creatinine: 0.74 mg/dL — ABNORMAL LOW (ref 0.80–1.30)
EGFR: 119 mL/min/{1.73_m2} (ref 60–150)
Glucose: 269 mg/dL — ABNORMAL HIGH (ref 71–99)
Osmolality Calculated: 288 mOsm/kg (ref 275–300)
Potassium: 3.6 mMol/L (ref 3.5–5.3)
Sodium: 140 mMol/L (ref 136–147)

## 2021-09-10 LAB — VH DEXTROSE STICK GLUCOSE
Glucose POCT: 247 mg/dL — ABNORMAL HIGH (ref 71–99)
Glucose POCT: 193 mg/dL — ABNORMAL HIGH (ref 71–99)
Glucose POCT: 187 mg/dL — ABNORMAL HIGH (ref 71–99)
Glucose POCT: 105 mg/dL — ABNORMAL HIGH (ref 71–99)
Glucose POCT: 88 mg/dL (ref 71–99)

## 2021-09-10 LAB — VH CULTURE-BLOOD-VENIPUNCTURE: Culture Result: NO GROWTH

## 2021-09-10 LAB — MAGNESIUM: Magnesium: 1.6 mg/dL (ref 1.6–2.6)

## 2021-09-10 NOTE — Progress Notes (Signed)
Quick Doc  [facility] [address]   Patient Name: Arthur Kim, Arthur Kim   Attending Physician: Orville Govern, MD   Today's date:   09/10/2021 LOS: 5 days   Expected Discharge Date      Quick  Assessment:                                                              ReAdmit Risk Score: 12.37    CM Comments: 09/10/21 RNCM (PH) ADM: s/p I&D L foot 4th toe and MPJ on 09/05/21 2 diabetic foot ulcer. MRI reveals L heel abscess, I&D on 12/20 for deep heel abscess. He is medically ready in 1-2 days per podiatry. Discharge plan discussed w/pt states he is already w/[facility] out of [place], [place].  Contacted agency, left VMM but no return call. [phone], states he also has wound vac and home health has been changing wound MWF-although he did later state he was not wearing wound vac. Dr. [place] Adkins is [phone]. Dr. Ovidio Kin for ID consulted-antibiotic grid placed-will need 4 weeks of IV ABX. PICC line placed.  Home health order and referral sent to liaisons for processing.      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 Lives near [place], [place].  lives alone in home, indep ADL/amb. Drives, has PCP and RX Coverage.  BARRIERS: foot abscess, lives alone.   NEEDS: home health [facility] and iv delivery.    RNCM will continue to follow.                                                                                       Provider Notifications:        Vanice Sarah. Leonor Liv, RN MSN  Case Management  Ph: [phone]  Fax: [phone]

## 2021-09-10 NOTE — Plan of Care (Signed)
Patient:  Arthur Kim, Arthur Kim, 40 y.o. 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]  Cellulitis of left foot [L03.116]  Elevated beta-hydroxybutyrate [R78.89]  Osteomyelitis of left foot, unspecified type [M86.9]  Diabetic ulcer of left midfoot associated with type 2 diabetes mellitus, unspecified ulcer stage [E11.621, L97.429]  Type 2 diabetes mellitus with hyperglycemia, unspecified whether long term insulin use [E11.65]    Events (last 24 hrs/overnight):        Nursing Shift Summary:      Assumed car of patient from PACU at 2030. Patient awake and alert at that time ordering dinner from [employer] from an app on his phone.   Vitals:      BP: 128/74 (09/10/2021 12:03 AM)  Heart Rate: (!) 112 (09/10/2021 12:03 AM)  Temp: 100.2 F (37.9 C) (09/10/2021 12:03 AM)  Resp Rate: 20 (09/10/2021 12:03 AM)   Oxygen Needs:    SpO2: 97 % (09/10/2021 12:03 AM)  O2 Device: None (Room air) (09/09/2021  8:37 PM)  O2 Flow Rate (L/min): 15 L/min (09/09/2021  6:54 PM)      Last 3 Weights:    Recent Weights for the past 720 hrs (Last 3 readings):   Weight   09/04/21 2131 81.6 kg (180 lb)       Tele:   No order of [facility] TELEMETRY MONITORING is found.         Cardiac Rhythm: Normal Sinus Rhythm     Drips:         Current Facility-Administered Medications   Medication Dose Last Admin      I/O: Last 24 hours:    UOP:  Foley/Drain LDA(s):       Intake/Output Summary (Last 24 hours) at 09/10/2021 0014  Last data filed at 09/09/2021 1839  Gross per 24 hour   Intake 480 ml   Output 2255 ml   Net -1775 ml        Drain Output:  Urethral Catheter (Active)   Catheter necessity reviewed? Yes 09/09/21 2000   Urine Output (mL) Total 700 mL 09/09/21 1734    No order of INSERT FOLEY  CATHETER is found.     No order of EXTERNAL URINARY CATHETER is found.     If there is a catheter present (internal or external)   Should we continue the catheter?  []  Remove   []  Keep           Last BM: Last BM Date: 09/07/21       Unmeasured Output This Shift:    No data found.     VTE PPX:       Current Facility-Administered Medications (Includes Only Anticoagulants)   Medication Dose Route Frequency Provider Last Rate Last Admin    enoxaparin (LOVENOX) syringe 40 mg  40 mg Subcutaneous Q24H Alyacoub, Ramez I, MD           Skin Issues/Concerns:      Skin issues noted this shift:      Wound 09/05/21 Diabetic/ Neuropathy Foot Anterior;Left 4-67mm circular scabbed wound on top of foot (Active)   09/05/21 0524 Foot   Wound Type: Diabetic/ Neuropathy   Pressure Injury Staging (WOCN/ Trained RNs Only):    Wound Location Orientation: Anterior;Left   Wound Description (Comments): 4-108mm circular scabbed wound on top  of foot   Present on Admission: Yes   WOCN Wound Description (Comments):    Non-staged Wound Description:    Site Description Dressing covering site (UTA) 09/09/21 1000   Peri-wound Description Clean;Dry;Intact 09/09/21 1000   Dressing Ace Wrap 09/09/21 0720   Dressing Status Clean;Dry;Intact 09/09/21 1000       Wound 09/05/21 Diabetic/ Neuropathy Heel Left 1.5-2 inch in diameter open reddened/pink wound to pad of foot (Active)   09/05/21 0525 Heel   Wound Type: Diabetic/ Neuropathy   Pressure Injury Staging (WOCN/ Trained RNs Only):    Wound Location Orientation: Left   Wound Description (Comments): 1.5-2 inch in diameter open reddened/pink wound to pad of foot   Present on Admission: Yes   WOCN Wound Description (Comments):    Non-staged Wound Description:    Site Description Dressing covering site (UTA) 09/09/21 1000   Dressing Ace Wrap 09/08/21 2300   Dressing Status Clean;Dry;Intact 09/09/21 1000       Wound 09/05/21 Surgical Incision Foot Left 4x4 gauze soaked in saline, kling and ace bandage, clean and  dry (Active)   09/05/21 1855 Foot   Wound Type: Surgical Incision   Pressure Injury Staging (WOCN/ Trained RNs Only):    Wound Location Orientation: Left   Wound Description (Comments): 4x4 gauze soaked in saline, kling and ace bandage, clean and dry   Present on Admission:    WOCN Wound Description (Comments):    Non-staged Wound Description:    Site Description Dressing covering site (UTA) 09/09/21 1000   Closure Approximated 09/07/21 0920   Dressing Gauze Bandage Roll;Ace Wrap 09/08/21 2300   Dressing Changed New 09/05/21 1900   Dressing Status Clean;Dry;Intact 09/09/21 1000       Wound 09/09/21 Surgical Incision Other (Comment) Left Xeroform, 4x4's, kerlix, ace (Active)   09/09/21 1840 Other (Comment)   Wound Type: Surgical Incision   Pressure Injury Staging (WOCN/ Trained RNs Only):    Wound Location Orientation: Left   Wound Description (Comments): Xeroform, 4x4's, kerlix, ace   Present on Admission:    WOCN Wound Description (Comments):    Non-staged Wound Description:    Site Description Dressing covering site (UTA) 09/09/21 2000   Peri-wound Description Dressing covering site (UTA) 09/09/21 2000   Closure Dressing covering site (UTA) 09/09/21 2000   Drainage Amount Dressing covering site (UTA) 09/09/21 2000   Dressing Xeroform;Ace Wrap;Gauze;Gauze Bandage Roll 09/09/21 2000   Dressing Changed New 09/09/21 1900   Dressing Status Clean;Dry;Intact 09/09/21 2000         Chem Sticks (Hyper/Hypo)       Glucose POCT   Date Value Ref Range Status   09/09/2021 209 (H) 71 - 99 mg/dL Final     Comment:     The above 1 analytes were performed by [facility] [phone])  [address],[place] [address]         Mobility:        PMP Activity: Step 4 - Dangle at Bedside (09/09/2021  7:15 AM)      Falls Risk:   Risk Category: Moderate      PT/OT (recommendations):      No data recorded    Care Act Designee to care for patient after discharge:      Designate Care Provider?: Refused (09/05/2021  2:00  AM)      Care Partner(s) who will help during hospital stay:      No data recorded    Patient Rounding, Team Members Present    []   Rounds Occurred   []  Patient    []  MD    []  CM    []  Primary RN    []  Charge Nurse    []  Pharmacist      (In a few words, as a patient, do you have any goals or concerns you would like to share  with the team today? )     Patient stated goals and/or concerns:          Action plan:                       Problem: Inadequate Cardiac Output  Goal: Adequate tissue perfusion will be maintained  Outcome: Progressing     Problem: Compromised skin integrity  Goal: Skin integrity is maintained or improved  Outcome: Progressing     Problem: Compromised Tissue integrity  Goal: Damaged tissue is healing and protected  Outcome: Progressing  Goal: Nutritional status is improving  Outcome: Progressing

## 2021-09-10 NOTE — Progress Notes (Signed)
PROGRESS NOTE    Date Time: 09/10/21 6:32 PM  Patient Name: Arthur Kim, Arthur Kim      Subjective:   F/u left foot.  Feeling much better now.  Pain under much better control after I&D heel abscess.  Patient relates no other concerns.  Medications:     Current Facility-Administered Medications   Medication Dose Route Frequency    aspirin  81 mg Oral Daily    colchicine  0.6 mg Oral Daily    DAPTOmycin (CUBICIN) IVPB  6 mg/kg Intravenous Q24H    enoxaparin  40 mg Subcutaneous Q24H    gabapentin  300 mg Oral TID    insulin glargine  40 Units Subcutaneous Q12H    insulin lispro (1 Unit Dial)  2-16 Units Subcutaneous TID AC    And    insulin lispro (1 Unit Dial)  1-9 Units Subcutaneous QHS and 0300    isosorbide mononitrate  60 mg Oral Daily at 0600    metoprolol succinate XL  50 mg Oral Daily    sodium chloride (PF)  3 mL Intravenous Q12H [facility]    ticagrelor  90 mg Oral Q12H         Physical Exam:     Vitals:    09/10/21 1731   BP: 112/78   Pulse: 100   Resp: 20   Temp: 97.3 F (36.3 C)   SpO2: 98%       Intake and Output Summary (Last 24 hours) at Date Time    Intake/Output Summary (Last 24 hours) at 09/10/2021 1832  Last data filed at 09/10/2021 1200  Gross per 24 hour   Intake 480 ml   Output 5 ml   Net 475 ml       General appearance - well appearing, and in no distress  Mental status - alert, oriented to person, place, and time, normal mood, behavior, speech, dress, motor activity, and thought processes  Neurological - alert, oriented, normal speech,  Dressing left foot is clean and dry.  No strikethrough      Labs:     Results       Procedure Component Value Units Date/Time    Dextrose Stick Glucose [[id number]]  (Abnormal) Collected: 09/10/21 1719    Specimen: Blood Updated: 09/10/21 1721     Glucose POCT 105 mg/dL     Dextrose Stick Glucose [[id number]]  (Abnormal) Collected: 09/10/21 1137    Specimen: Blood Updated: 09/10/21 1140     Glucose POCT 187 mg/dL     Tissue Culture and Smear [[id number]] Collected: 09/09/21 1829     Specimen: Tissue from Left Heel Updated: 09/10/21 1102     Gram Stain --     No Epithelial Cells  Moderate WBC's Seen  No Organisms Seen       Culture Result No Growth - Day 1, Reincubate    Dextrose Stick Glucose [[id number]]  (Abnormal) Collected: 09/10/21 0808    Specimen: Blood Updated: 09/10/21 0811     Glucose POCT 193 mg/dL     Magnesium [[id number]] Collected: 09/10/21 0506    Specimen: Plasma Updated: 09/10/21 0615     Magnesium 1.6 mg/dL     Basic Metabolic Panel [[id number]]  (Abnormal) Collected: 09/10/21 0506    Specimen: Plasma Updated: 09/10/21 0615     Sodium 140 mMol/L      Potassium 3.6 mMol/L      Chloride 107 mMol/L      CO2 23 mMol/L      Calcium 8.8 mg/dL  Glucose 269 mg/dL      Creatinine 1.61 mg/dL      BUN 10 mg/dL      Anion Gap 09.6 mMol/L      BUN / Creatinine Ratio 13.5 Ratio      EGFR 119 mL/min/1.52m2      Osmolality Calculated 288 mOsm/kg     CBC and differential [[id number]]  (Abnormal) Collected: 09/10/21 0506    Specimen: Blood Updated: 09/10/21 0556     WBC 10.3 K/cmm      RBC 3.73 M/cmm      Hemoglobin 10.2 gm/dL      Hematocrit 91.4 %      MCV 84 fL      MCH 27 pg      MCHC 32 gm/dL      RDW 78.2 %      PLT CT 366 K/cmm      MPV 8.0 fL      Neutrophils % 75.8 %      Lymphocytes 13.7 %      Monocytes 8.4 %      Eosinophils % 2.0 %      Basophils % 0.1 %      Neutrophils Absolute 7.8 K/cmm      Lymphocytes Absolute 1.4 K/cmm      Monocytes Absolute 0.9 K/cmm      Eosinophils Absolute 0.2 K/cmm      Basophils Absolute 0.0 K/cmm     Dextrose Stick Glucose [[id number]]  (Abnormal) Collected: 09/10/21 0348    Specimen: Blood Updated: 09/10/21 0351     Glucose POCT 247 mg/dL     Blood Culture - Venipuncture [[id number]] Collected: 09/04/21 2344    Specimen: Blood from Venipuncture Updated: 09/10/21 0005     Culture Result --     Blood culture volume for this collection was less than the optimal 5 ml needed to achieve adequate sensitivity.  No Growth in 5 Days      Blood Culture -  Venipuncture [[id number]] Collected: 09/04/21 2215    Specimen: Blood from Venipuncture Updated: 09/10/21 0005     Culture Result No Growth in 5 Days    Dextrose Stick Glucose [[id number]]  (Abnormal) Collected: 09/09/21 2135    Specimen: Blood Updated: 09/09/21 2137     Glucose POCT 209 mg/dL     Dextrose Stick Glucose [[id number]]  (Abnormal) Collected: 09/09/21 1854    Specimen: Blood Updated: 09/09/21 1856     Glucose POCT 314 mg/dL             Recent CBC   Recent Labs     09/10/21  0506   RBC 3.73*   Hemoglobin 10.2*   Hematocrit 31.3*   MCV 84   MCH 27*   MCHC 32   RDW 16.6*   MPV 8.0     Recent BMP   Recent Labs     09/10/21  0506   Glucose 269*   BUN 10   Creatinine 0.74*   Calcium 8.8   Sodium 140   Potassium 3.6   Chloride 107   CO2 23       Lab Results   Component Value Date    WBC 10.3 09/10/2021    HGB 10.2 (L) 09/10/2021    PLT 366 09/10/2021    NA 140 09/10/2021    K 3.6 09/10/2021    BUN 10 09/10/2021    CREAT 0.74 (L) 09/10/2021    MG 1.6 09/10/2021    AST 7 (L) 09/08/2021  ALB 2.7 (L) 09/08/2021    INR 1.0 09/04/2021       Rads:   Radiological Procedure reviewed.    Assessment:   Cellulitis/abscess left foot and heel improved  DM-2 with neuropathy, uncontrolled.  S/p I&D left foot and heel  Chronic diabetic ulcer left foot.  ?septic arthritis left 4th MPJ and osteomyelitis per MRI with no significant clinical correlation.    Plan:   Patient was seen, examined and informed of findings and treatment plan.  Ready for d/c from my standpoint.  Will need follow up appointment in 7 days for dressing change.  Recommend 4-6 weeks IV abx, PICC line already placed.  NWB left foot with walker.        Signed by: Jannett Celestine, DPM

## 2021-09-10 NOTE — Progress Notes (Signed)
PROGRESS NOTE    Date Time: 09/10/21 10:56 AM  Patient Name: Arthur Kim, Arthur Kim    Assessment:   Principal Problem:    Osteomyelitis of left foot      Plan:   Need to find a physician locally who will sign his home health orders in [place].  Continuing daptomycin.    Subjective:   Doing well awaiting discharge plans.    Medications:     Current Facility-Administered Medications   Medication Dose Route Frequency    aspirin  81 mg Oral Daily    colchicine  0.6 mg Oral Daily    DAPTOmycin (CUBICIN) IVPB  6 mg/kg Intravenous Q24H    enoxaparin  40 mg Subcutaneous Q24H    gabapentin  300 mg Oral TID    insulin glargine  40 Units Subcutaneous Q12H    insulin lispro (1 Unit Dial)  2-16 Units Subcutaneous TID AC    And    insulin lispro (1 Unit Dial)  1-9 Units Subcutaneous QHS and 0300    isosorbide mononitrate  60 mg Oral Daily at 0600    metoprolol succinate XL  50 mg Oral Daily    sodium chloride (PF)  3 mL Intravenous Q12H [facility]    ticagrelor  90 mg Oral Q12H       Physical Exam:     Vitals:    09/10/21 0808   BP: 118/67   Pulse: (!) 102   Resp: 20   Temp: 98.4 F (36.9 C)   SpO2: 97%       Intake and Output Summary (Last 24 hours) at Date Time    Intake/Output Summary (Last 24 hours) at 09/10/2021 1056  Last data filed at 09/09/2021 1839  Gross per 24 hour   Intake --   Output 705 ml   Net -705 ml       Afebrile.  Still has a wrap on his left foot 1+ edema of his toes.  He is afebrile.  Had repeat drainage of his foot yesterday    Labs:     Results       Procedure Component Value Units Date/Time    Dextrose Stick Glucose [[id number]]  (Abnormal) Collected: 09/10/21 0808    Specimen: Blood Updated: 09/10/21 0811     Glucose POCT 193 mg/dL     Magnesium [[id number]] Collected: 09/10/21 0506    Specimen: Plasma Updated: 09/10/21 0615     Magnesium 1.6 mg/dL     Basic Metabolic Panel [[id number]]  (Abnormal) Collected: 09/10/21 0506    Specimen: Plasma Updated: 09/10/21 0615     Sodium 140 mMol/L      Potassium 3.6 mMol/L       Chloride 107 mMol/L      CO2 23 mMol/L      Calcium 8.8 mg/dL      Glucose 086 mg/dL      Creatinine 5.78 mg/dL      BUN 10 mg/dL      Anion Gap 46.9 mMol/L      BUN / Creatinine Ratio 13.5 Ratio      EGFR 119 mL/min/1.97m2      Osmolality Calculated 288 mOsm/kg     CBC and differential [[id number]]  (Abnormal) Collected: 09/10/21 0506    Specimen: Blood Updated: 09/10/21 0556     WBC 10.3 K/cmm      RBC 3.73 M/cmm      Hemoglobin 10.2 gm/dL      Hematocrit 24.4 %      MCV 84  fL      MCH 27 pg      MCHC 32 gm/dL      RDW 16.1 %      PLT CT 366 K/cmm      MPV 8.0 fL      Neutrophils % 75.8 %      Lymphocytes 13.7 %      Monocytes 8.4 %      Eosinophils % 2.0 %      Basophils % 0.1 %      Neutrophils Absolute 7.8 K/cmm      Lymphocytes Absolute 1.4 K/cmm      Monocytes Absolute 0.9 K/cmm      Eosinophils Absolute 0.2 K/cmm      Basophils Absolute 0.0 K/cmm     Dextrose Stick Glucose [[id number]]  (Abnormal) Collected: 09/10/21 0348    Specimen: Blood Updated: 09/10/21 0351     Glucose POCT 247 mg/dL     Blood Culture - Venipuncture [[id number]] Collected: 09/04/21 2344    Specimen: Blood from Venipuncture Updated: 09/10/21 0005     Culture Result --     Blood culture volume for this collection was less than the optimal 5 ml needed to achieve adequate sensitivity.  No Growth in 5 Days      Blood Culture - Venipuncture [[id number]] Collected: 09/04/21 2215    Specimen: Blood from Venipuncture Updated: 09/10/21 0005     Culture Result No Growth in 5 Days    Dextrose Stick Glucose [[id number]]  (Abnormal) Collected: 09/09/21 2135    Specimen: Blood Updated: 09/09/21 2137     Glucose POCT 209 mg/dL     Tissue Culture and Smear [[id number]] Collected: 09/09/21 1829    Specimen: Tissue from Left Heel Updated: 09/09/21 2045     Gram Stain --     No Epithelial Cells  Moderate WBC's Seen  No Organisms Seen      Dextrose Stick Glucose [[id number]]  (Abnormal) Collected: 09/09/21 1854    Specimen: Blood Updated: 09/09/21 1856      Glucose POCT 314 mg/dL     Dextrose Stick Glucose [[id number]]  (Abnormal) Collected: 09/09/21 1602    Specimen: Blood Updated: 09/09/21 1604     Glucose POCT 247 mg/dL     Dextrose Stick Glucose [[id number]]  (Abnormal) Collected: 09/09/21 1232    Specimen: Blood Updated: 09/09/21 1235     Glucose POCT 273 mg/dL             Rads:   Radiological Procedure reviewed.    Signed by: Rosiland Oz, MD

## 2021-09-10 NOTE — Plan of Care (Signed)
Problem: Inadequate Cardiac Output  Goal: Adequate tissue perfusion will be maintained  Outcome: Progressing  Flowsheets (Taken 09/05/2021 0242 by Leighton Roach, RN)  Adequate tissue perfusion will be maintained:   Monitor/assess vital signs   Monitor/assess lab values and report abnormal values   Monitor/assess neurovascular status (pulses, capillary refill, pain, paresthesia, paralysis, presence of edema)   Monitor intake and output   Monitor for signs and symptoms of a pulmonary embolism (dyspnea, tachypnea, tachycardia, confusion)   Monitor/assess for signs of VTE (edema of calf/thigh redness, pain)   VTE Prevention: Administer anticoagulant(s) and/or apply anti-embolism stockings/devices as ordered   Encourage/assist patient as needed to turn, cough, and perform deep breathing every 2 hours     Problem: Infection  Goal: Free from infection  Outcome: Progressing  Flowsheets (Taken 09/09/2021 1703)  Free from infection:   Assess for signs/symptoms of infection   Utilize isolation precautions per protocol/policy   Assess immunization status   Consult/collaborate with Infection Preventionist   Utilize sepsis protocol

## 2021-09-10 NOTE — Progress Notes (Signed)
Patient:  Arthur Kim, Arthur Kim, 40 y.o. 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  Patient in bed resting with L foot elevated for comfort. Call bell within reach. White board updated.   Vitals:      BP: 118/67 (09/10/2021  8:08 AM)  Heart Rate: (!) 102 (09/10/2021  8:08 AM)  Temp: 98.4 F (36.9 C) (09/10/2021  8:08 AM)  Resp Rate: 20 (09/10/2021  8:08 AM)   Oxygen Needs:    SpO2: 97 % (09/10/2021  8:08 AM)  O2 Device: None (Room air) (09/10/2021  8:08 AM)  O2 Flow Rate (L/min): 15 L/min (09/09/2021  6:54 PM)      Last 3 Weights:    Recent Weights for the past 720 hrs (Last 3 readings):   Weight   09/10/21 0626 85.4 kg (188 lb 4.4 oz)   09/04/21 2131 81.6 kg (180 lb)       Tele:   No order of VH TELEMETRY MONITORING is found.         Cardiac Rhythm: Normal Sinus Rhythm     Drips:         Current Facility-Administered Medications   Medication Dose Last Admin      I/O: Last 24 hours:    UOP:  Foley/Drain LDA(s):       Intake/Output Summary (Last 24 hours) at 09/10/2021 2956  Last data filed at 09/09/2021 1839  Gross per 24 hour   Intake --   Output 705 ml   Net -705 ml        Drain Output:  Urethral Catheter (Active)   Catheter necessity reviewed? Yes 09/10/21 0500   Urine Output  (mL) Total 700 mL 09/09/21 1734    No order of INSERT FOLEY CATHETER is found.     No order of EXTERNAL URINARY CATHETER is found.     If there is a catheter present (internal or external)   Should we continue the catheter?  []  Remove   [x]  Keep           Last BM: Last BM Date: 09/07/21       Unmeasured Output This Shift:    No data found.     VTE PPX:       Current Facility-Administered Medications (Includes Only Anticoagulants)   Medication Dose Route Frequency Provider Last Rate Last Admin    enoxaparin (LOVENOX) syringe 40 mg  40 mg Subcutaneous Q24H Jannett Celestine, DPM           Skin Issues/Concerns:      Skin issues noted this shift:      Wound 09/05/21 Diabetic/ Neuropathy Foot Anterior;Left 4-59mm circular scabbed wound on top of foot (Active)   09/05/21 0524 Foot   Wound Type: Diabetic/ Neuropathy   Pressure Injury Staging (WOCN/ Trained RNs Only):  Wound Location Orientation: Anterior;Left   Wound Description (Comments): 4-21mm circular scabbed wound on top of foot   Present on Admission: Yes   WOCN Wound Description (Comments):    Non-staged Wound Description:    Site Description Dressing covering site (UTA) 09/09/21 2030   Peri-wound Description Clean;Dry;Intact 09/09/21 2030   Dressing Ace Wrap 09/09/21 2030   Dressing Status Clean;Dry;Intact 09/09/21 2030       Wound 09/05/21 Diabetic/ Neuropathy Heel Left 1.5-2 inch in diameter open reddened/pink wound to pad of foot (Active)   09/05/21 0525 Heel   Wound Type: Diabetic/ Neuropathy   Pressure Injury Staging (WOCN/ Trained RNs Only):    Wound Location Orientation: Left   Wound Description (Comments): 1.5-2 inch in diameter open reddened/pink wound to pad of foot   Present on Admission: Yes   WOCN Wound Description (Comments):    Non-staged Wound Description:    Site Description Dressing covering site (UTA) 09/09/21 2030   Dressing Ace Wrap 09/09/21 2030   Dressing Status Clean;Dry;Intact 09/09/21 2030       Wound 09/05/21 Surgical Incision Foot  Left 4x4 gauze soaked in saline, kling and ace bandage, clean and dry (Active)   09/05/21 1855 Foot   Wound Type: Surgical Incision   Pressure Injury Staging (WOCN/ Trained RNs Only):    Wound Location Orientation: Left   Wound Description (Comments): 4x4 gauze soaked in saline, kling and ace bandage, clean and dry   Present on Admission:    WOCN Wound Description (Comments):    Non-staged Wound Description:    Site Description Dressing covering site (UTA) 09/09/21 2030   Closure Approximated 09/07/21 0920   Dressing Gauze Bandage Roll;Ace Wrap 09/08/21 2300   Dressing Changed New 09/05/21 1900   Dressing Status Clean;Dry;Intact 09/09/21 2030       Wound 09/09/21 Surgical Incision Other (Comment) Left Xeroform, 4x4's, kerlix, ace (Active)   09/09/21 1840 Other (Comment)   Wound Type: Surgical Incision   Pressure Injury Staging (WOCN/ Trained RNs Only):    Wound Location Orientation: Left   Wound Description (Comments): Xeroform, 4x4's, kerlix, ace   Present on Admission:    WOCN Wound Description (Comments):    Non-staged Wound Description:    Site Description Dressing covering site (UTA) 09/09/21 2030   Peri-wound Description Dressing covering site (UTA) 09/09/21 2030   Closure Dressing covering site (UTA) 09/09/21 2030   Drainage Amount Dressing covering site (UTA) 09/09/21 2030   Dressing Xeroform;Ace Wrap;Gauze;Gauze Bandage Roll 09/09/21 2000   Dressing Changed New 09/09/21 1900   Dressing Status Clean;Dry;Intact 09/09/21 2030         Chem Sticks (Hyper/Hypo)       Glucose POCT   Date Value Ref Range Status   09/10/2021 193 (H) 71 - 99 mg/dL Final     Comment:     The above 1 analytes were performed by [facility] [id number])  [address],[place] [address]         Mobility:        PMP Activity: Step 3 - Bed Mobility (09/09/2021  8:30 PM)      Falls Risk:   Risk Category: Moderate      PT/OT (recommendations):      No data recorded    Care Act Designee to care for patient after  discharge:      Designate Care Provider?: Refused (09/05/2021  2:00 AM)      Care Partner(s) who will help during hospital stay:  No data recorded    Patient Rounding, Team Members Present    [x]  Rounds Occurred   [x]  Patient    [x]  MD    [x]  CM    [x]  Primary RN    []  Charge Nurse    [x]  Pharmacist      (In a few words, as a patient, do you have any goals or concerns you would like to share  with the team today? )     Patient stated goals and/or concerns:          Action plan:

## 2021-09-10 NOTE — Progress Notes (Signed)
Medicine Progress Note   [facility]  [facility] [employer]   Patient Name: Arthur Kim, Arthur Kim LOS: 5 days   Attending Physician: Orville Govern, MD PCP: Marisa Sprinkles, MD      Hospital Course:                                                            Arthur Kim is a 40 y.o. male patient with past medical history of prostatitis with indwelling Foley catheter, diabetes, diabetic ketoacidosis, diabetic foot ulcerations presented with worsening left foot pain . He states He has been with having diabetic ulcer with recurrent infections requiring greater than a month hospitalization currently on doxycycline and Bactrim. 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      In the ED he was medically stable afebrile remarkable for leukocytosis, elevated CRP ESR, elevated lactic acid 2.5 x-ray left foot showed possible osteomyelitis of the left fourth toe.    Podiatry consulted.  Underwent left foot I&D on 12/16.  MRI of left heel shows abscess, repeat I&D  on 12/20.     Assessment and Plan:      #Diabetic ulcer of the left foot with cellulitis/abscess  #Possible osteomyelitis of the fourth left toe  #Possible septic arthritis of the left fourth MTP joint  #Lactic acidosis  -hemodynamically stable afebrile  -Xray There is periostitis of the fourth metatarsal with suggestion of osteomyelitis of the fourth metatarsal head and possibly base of the fourth proximal phalanx. I suspect that there is septic arthritis of the fourth MTP joint.  -MRI left foot showed Diabetic left foot infection. Small deep plantar soft tissue ulcer extending to the bone in the left forefoot with acute septic arthritis  of the left fourth MTP joint and acute osteomyelitis with bone destruction of the adjacent proximal phalanx of the left fourth toe and distal fourth metatarsal bone. Reactive bone marrow edema or acute osteomyelitis without bone destruction of the heads of the left second and fourth metatarsals.  -Blood cultures negative to date  -OR cultures growing MRSA  -antibiotics changed to Daptomycin - plan for 4 weeks  -pain control with norco prn + IV dilaudid prn severe pain  -MRI left heel shows abscess  -s/p repeat I&D on 12/20.Marland Kitchen pending cultures... likely same MRSA  -ID consulted - Dr. Ovidio Kin     #Mild DKA present on admission  #Type 1 diabetes:  -hba1c: 8.7  - lantus to 40 units Q12  -medium  SSI      #Hypokalemia  #Hypomagnesemia  -improved  -repeat BMP in AM     #History of coronary disease status post PCI and stent  -Continue aspirin and Brilinta  -continue Toprol XL     #History of hypertension  -Continue Toprol XL and Imdur    #Prostatitis:  #BPH  #Chronic urinary retention  -Has chronic Foley catheter in place due to enlarged prostate and incomplete bladder emptying   -will  need outpatient urology follow up    Disposition: pending Home ATBs and HH arrangement  DVT PPX: Medication VTE Prophylaxis Orders: enoxaparin (LOVENOX) syringe 40 mg  Mechanical VTE Prophylaxis Orders: Mechanical VTE: Pneumatic Compression; Knee high  Code:  Full Code       Subjective     Had repeat I&D last night otherwise doing okay.  Pain controlled    Objective   Physical Exam:     Vitals: T:98.4 F (36.9 C) (Temporal), BP:118/67, HR:(!) 102, RR:20, SaO2:97%    General: Patient is awake. In no acute distress.  HEENT: No conjunctival drainage, vision is intact, anicteric sclera.  Neck: Supple, no thyromegaly.  Chest: CTA bilaterally. No rhonchi, no wheezing. No use of accessory muscles.  CVS: Normal rate and regular rhythm no murmurs, without JVD, no pitting edema, pulses palpable.  Abdomen: Soft, non-tender, no guarding or rigidity,  with normal bowel sounds.  Extremities: +left foot dressing  Skin: Warm, dry, no rash and no worrisome lesions.  NEURO: No motor or sensory deficits.  Psychiatric: Alert, interactive, appropriate, normal affect.    Weight Monitoring 09/04/2021 09/10/2021   Height 175.3 cm -   Height Method Estimated -   Weight 81.647 kg 85.4 kg   Weight Method Estimated -   BMI (calculated) 26.6 kg/m2 -           Intake/Output Summary (Last 24 hours) at 09/10/2021 1250  Last data filed at 09/09/2021 1839  Gross per 24 hour   Intake --   Output 705 ml   Net -705 ml       Body mass index is 27.8 kg/m.     Meds:     Current Facility-Administered Medications   Medication Dose Route Frequency    aspirin  81 mg Oral Daily    colchicine  0.6 mg Oral Daily    DAPTOmycin (CUBICIN) IVPB  6 mg/kg Intravenous Q24H    enoxaparin  40 mg Subcutaneous Q24H    gabapentin  300 mg Oral TID    insulin glargine  40 Units Subcutaneous Q12H    insulin lispro (1 Unit Dial)  2-16 Units Subcutaneous TID AC    And    insulin lispro (1 Unit Dial)  1-9 Units Subcutaneous QHS and 0300    isosorbide mononitrate  60 mg Oral Daily at 0600    metoprolol succinate XL  50 mg Oral Daily    sodium chloride (PF)  3 mL Intravenous Q12H [facility]    ticagrelor  90 mg Oral Q12H       PRN Meds: acetaminophen **OR** acetaminophen **OR** acetaminophen, dextrose, glucagon (rDNA), HYDROcodone-acetaminophen, HYDROmorphone, naloxone.     LABS:     Estimated Creatinine Clearance: 146.6 mL/min (A) (based on SCr of 0.74 mg/dL (L)).  Recent Labs   Lab 09/10/21  0506 09/09/21  0340   WBC 10.3 11.8*   RBC 3.73* 3.99*   Hemoglobin 10.2* 10.7*   Hematocrit 31.3* 33.4*   MCV 84 84   PLT CT 366 335       Recent Labs   Lab 09/04/21  2215   PT 10.7   PT INR 1.0       Recent Labs   Lab 09/09/21  0340   Creatine Kinase (CK) 19*       Lab Results   Component Value Date    HGBA1CPERCNT 8.7 09/05/2021     Recent Labs   Lab 09/10/21  0506 09/09/21  0340 09/08/21  0441   Glucose 269*  270* 114*   Sodium  140 134* 145   Potassium 3.6 3.6 4.0   Chloride 107 103 109   CO2 23 24 26    BUN 10 10 10    Creatinine 0.74* 0.74* 0.68*   EGFR 119 119 122   Calcium 8.8 8.8 9.2       Recent Labs   Lab 09/10/21  0506 09/09/21  0340 09/08/21  0441 09/07/21  0541   Magnesium 1.6 1.7 1.8 1.7   Albumin  --   --  2.7* 2.4*   Protein, Total  --   --  7.2 6.1   Bilirubin, Total  --   --  0.2 0.2   Alkaline Phosphatase  --   --  155* 147*   ALT  --   --  9 10   AST (SGOT)  --   --  7* 7*       Recent Labs   Lab 09/04/21  2215   Urine Specific Gravity 1.010   pH, Urine 6.0   Protein, UR Trace*   Glucose, UA >=1000*   Ketones UA Trace*   Bilirubin, UA Negative   Blood, UA Moderate*   Nitrite, UA Negative   Urobilinogen, UA 0.2   Leukocyte Esterase, UA Negative   WBC, UA 4   RBC, UA 6*        Patient Lines/Drains/Airways Status       Active PICC Line / CVC Line / PIV Line / Drain / Airway / Intraosseous Line / Epidural Line / ART Line / Line / Wound / Pressure Ulcer / NG/OG Tube       Name Placement date Placement time Site Days    Peripheral IV 09/04/21 20 G Right Wrist 09/04/21  2146  Wrist  1    Urethral Catheter 09/05/21  0335  --  1    Non-Surgical Airway 09/05/21  1819  --  less than 1    Wound 09/05/21 Diabetic/ Neuropathy Foot Anterior;Left 4-29mm circular scabbed wound on top of foot 09/05/21  0524  Foot  1    Wound 09/05/21 Diabetic/ Neuropathy Heel Left 1.5-2 inch in diameter open reddened/pink wound to pad of foot 09/05/21  0525  Heel  1    Wound 09/05/21 Surgical Incision Foot Left 4x4 gauze soaked in saline, kling and ace bandage, clean and dry 09/05/21  1855  Foot  less than 1                   PICC Placement Single Lumen - VH    Result Date: 09/09/2021  Successful ultrasound guided venous access and fluoroscopically guided PICC insertion. ReadingStation:[email or web address]    XR Foot Left AP Lateral And Oblique    Result Date: 09/04/2021  There is periostitis of the fourth metatarsal with suggestion of osteomyelitis of the fourth  metatarsal head and possibly base of the fourth proximal phalanx. I suspect that there is septic arthritis of the fourth MTP joint. There is cellulitis of the foot laterally and possible deep soft tissue gas. ReadingStation:[email or web address]    MRI Heel Left WO Contrast    Result Date: 09/08/2021  1.  Plantar skin and soft tissue ulcer at the mid-foot with packing material in place and a small amount of unencapsulated fluid extending from the base of the defect proximally over the medial cord of the plantar aponeurosis. 2.  Plantar skin defect centered over the fourth MTP joint with a small amount of unencapsulated fluid tracking towards the plantar plate. Additional,  larger dorsal defect with sinus tract communicating with an approximately 21 mm fluid collection superficial to the third and fourth MTP joints. 3.  Effusion at the fourth MTP joint, and significant marrow signal abnormalities suggesting septic arthritis and osteomyelitis. Probable pathologic nondisplaced fracture through the neck of the fourth metatarsal. 4.  Milder signal abnormalities at the second and third metatarsal heads and the third proximal phalanx, possibly reactive though early osteomyelitis is not excluded. Evidence of a nondisplaced fracture at the base of the third proximal phalanx.  ReadingStation:[email or web address]    CT Abdomen Pelvis with IV Cont    Result Date: 09/05/2021  1. There is mild circumferential wall thickening of the rectum and distal sigmoid colon with mild adjacent stranding. This could be related to nondistention or represent a focal proctitis. No free fluid or abnormal gas collections are noted. Large amount  of gas and stool are seen in the proximal colon. 2. Foley catheter is present within the urinary bladder. There is mild stranding associated with the prostate and seminal vesicles as well and cannot exclude acute inflammation. 3. Dependent soft tissue edema. ReadingStation:[email or web address]    MRI Foot Left W WO  Contrast    Result Date: 09/05/2021  1.  Diabetic left foot infection. 2.  Small deep plantar soft tissue ulcer extending to the bone in the left forefoot with acute septic arthritis of the left fourth MTP joint and acute osteomyelitis with bone destruction of the adjacent proximal phalanx of the left fourth toe and distal fourth metatarsal bone. 3.  Reactive bone marrow edema or acute osteomyelitis without bone destruction of the heads of the left second and fourth metatarsals. ReadingStation:[email or web address]    Home Health Needs:    Date I saw the patient face-to-face:    09/09/2021     IVs:    IV Antibiotics    Central Line Care     Evidence this patient is homebound because:    A.  Post operative restrictions, weight bearing status impedes mobility > 5 feet    E.  Requires assistance of another person or assistive device to ambulate > 5 feet     Medical conditions that necessitate Home Health care:    A.  Post operative procedure requiring follow up care & monitoring for complication    B.  Functional impairment due to recent hospitalization/procedure/treatment    C.  Risk for complication/infection/pain requiring follow up and monitoring    F.  New diagnosis & treatment requiring follow up monitoring and management    J.  Wound requiring assessment, monitoring and care    K.  Central line/IV infusion requiring monitoring and management     Per clinical findings, following services are medically necessary:    Skilled Nursing    PT     Clinical findings that support the need for Skilled Nursing. SN will:    A. Educate on post operative care, restrictions & management of complications    B. Monitor post operative site for infection and educate on care management    C. Monitor for signs and symptoms of exacerbation of disease and management    D. Review medication reconciliation, manage and educate on use and side effects    E. Educate on IV antibiotic administration and monitor response to treatment    H. Assess  cardiopulmonary status and monitor for signs &symptoms of exacerbation    I.  Educate dietary and or fluid restrictions and weight management     Clinical findings that support the need  for Physical Therapy. PT will:    A.  Evaluate and treat functional impairment and improve mobility       Nutrition assessment done in collaboration with Registered Dietitians:        Orville Govern, MD     09/10/21,12:50 PM   MRN: [id number]                                      CSN: [id number] DOB: 03-30-1981

## 2021-09-11 LAB — BASIC METABOLIC PANEL
Anion Gap: 14 mMol/L (ref 7.0–18.0)
BUN / Creatinine Ratio: 14.7 Ratio (ref 10.0–30.0)
BUN: 10 mg/dL (ref 7–22)
CO2: 25 mMol/L (ref 20–30)
Calcium: 9.4 mg/dL (ref 8.5–10.5)
Chloride: 105 mMol/L (ref 98–110)
Creatinine: 0.68 mg/dL — ABNORMAL LOW (ref 0.80–1.30)
EGFR: 122 mL/min/{1.73_m2} (ref 60–150)
Glucose: 116 mg/dL — ABNORMAL HIGH (ref 71–99)
Osmolality Calculated: 279 mOsm/kg (ref 275–300)
Potassium: 4 mMol/L (ref 3.5–5.3)
Sodium: 140 mMol/L (ref 136–147)

## 2021-09-11 LAB — CBC AND DIFFERENTIAL
Basophils %: 0.3 % (ref 0.0–3.0)
Basophils Absolute: 0 10*3/uL (ref 0.0–0.3)
Eosinophils %: 3.1 % (ref 0.0–7.0)
Eosinophils Absolute: 0.3 10*3/uL (ref 0.0–0.8)
Hematocrit: 31.7 % — ABNORMAL LOW (ref 39.0–52.5)
Hemoglobin: 10.2 gm/dL — ABNORMAL LOW (ref 13.0–17.5)
Lymphocytes Absolute: 2 10*3/uL (ref 0.6–5.1)
Lymphocytes: 18 % (ref 15.0–46.0)
MCH: 27 pg — ABNORMAL LOW (ref 28–35)
MCHC: 32 gm/dL (ref 32–36)
MCV: 85 fL (ref 80–100)
MPV: 7.4 fL (ref 6.0–10.0)
Monocytes Absolute: 1.2 10*3/uL (ref 0.1–1.7)
Monocytes: 10.6 % (ref 3.0–15.0)
Neutrophils %: 68 % (ref 42.0–78.0)
Neutrophils Absolute: 7.4 10*3/uL (ref 1.7–8.6)
PLT CT: 456 10*3/uL — ABNORMAL HIGH (ref 130–440)
RBC: 3.74 10*6/uL — ABNORMAL LOW (ref 4.00–5.70)
RDW: 16.9 % — ABNORMAL HIGH (ref 11.0–14.0)
WBC: 10.8 10*3/uL (ref 4.0–11.0)

## 2021-09-11 LAB — VH DEXTROSE STICK GLUCOSE
Glucose POCT: 193 mg/dL — ABNORMAL HIGH (ref 71–99)
Glucose POCT: 281 mg/dL — ABNORMAL HIGH (ref 71–99)

## 2021-09-11 MED ORDER — OXYCODONE-ACETAMINOPHEN 5-325 MG PO TABS
1.0000 | ORAL_TABLET | Freq: Four times a day (QID) | ORAL | 0 refills | Status: AC | PRN
Start: 2021-09-11 — End: 2021-09-18

## 2021-09-11 MED ORDER — METRONIDAZOLE 500 MG PO TABS
500.0000 mg | ORAL_TABLET | Freq: Three times a day (TID) | ORAL | 0 refills | Status: AC
Start: 2021-09-11 — End: 2021-10-11

## 2021-09-11 MED ORDER — OXYCODONE-ACETAMINOPHEN 5-325 MG PO TABS
1.0000 | ORAL_TABLET | Freq: Four times a day (QID) | ORAL | 0 refills | Status: DC | PRN
Start: 2021-09-11 — End: 2021-09-11

## 2021-09-11 MED ORDER — METRONIDAZOLE 250 MG PO TABS
500.0000 mg | ORAL_TABLET | Freq: Three times a day (TID) | ORAL | Status: DC
Start: 2021-09-11 — End: 2021-09-11
  Administered 2021-09-11: 15:00:00 500 mg via ORAL
  Filled 2021-09-11: qty 2

## 2021-09-11 NOTE — Progress Notes (Signed)
Quick Doc  [facility] [place]   Patient Name: Arthur Kim, Arthur Kim   Attending Physician: No att. providers found   Today's date:   09/11/2021 LOS: 6 days   Expected Discharge Date      Quick  Assessment:                                                              ReAdmit Risk Score: 12.84    09/11/21 RNCM (PH) ADM: s/p I&D L foot 4th toe and MPJ on 09/05/21 2 diabetic foot ulcer. MRI reveals L heel abscess, I&D on 12/20 for deep heel abscess. He is medically ready in 1-2 days per podiatry. Discharge plan discussed w/pt states he is already w/[facility] out of [place], [place].  Home Health liaisons have set up home health and home IV ABX are being delivered to home today. [facility] RN has instructed pt in home IV infusion.  Pt states his rig is at the [employer] in [place] and he is driving home. States it is an automatic and needs to get it home, staff did express concerns. Taxi arranged for pt to get to [employer]. FWW delivered to room. Discharge orders completed.       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  Has been with this company for less than 6 months (no FMLA protection). Lives near [place], [place].  lives alone in home, indep ADL/amb. Drives, has PCP and RX Coverage.  BARRIERS: foot abscess, lives alone.   NEEDS: none.  Plan: home w/home health and home infusion.   Physical Discharge Disposition: Home, Home Health        Name of Home Health Agency Placement: Other (comment box) ([facility])     Name of DME Agency: [facility]                                                           Physical Discharge Disposition: Home, Home Health       Provider Notifications:        Javarian Jakubiak A. Leonor Liv, RN MSN  Case Management  Ph: [phone]  Fax: [phone]

## 2021-09-11 NOTE — Discharge Instr - AVS First Page (Addendum)
HOME INFUSION    [facility] arranged to provide you with your home transfusions, they will deliver your medications to your home. The home health nurse will call you to confirm your first visit. If you have any questions please call [phone].       HOME HEALTH     [facility] has been arranged to see you for home health care. The home health nurse will call you to arrange your first visit once you are discharged. If you have any questions prior to the visit please call the home health agency at [phone]        Nonweightbearing with a walker on left foot

## 2021-09-11 NOTE — Nursing Progress Note (Signed)
Patient:  Arthur Kim, Arthur Kim, 40 y.o. 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]  Cellulitis of left foot [L03.116]  Elevated beta-hydroxybutyrate [R78.89]  Osteomyelitis of left foot, unspecified type [M86.9]  Diabetic ulcer of left midfoot associated with type 2 diabetes mellitus, unspecified ulcer stage [E11.621, L97.429]  Type 2 diabetes mellitus with hyperglycemia, unspecified whether long term insulin use [E11.65]    Events (last 24 hrs/overnight):        Nursing Shift Summary:      2200: Assumed care of patient at 1900. Patient appears flustered, states that he is "tired of getting poked and woken up, he just wants to sleep". Stated that he wanted to leave and sign out AMA. This RN spoke with pt refused vitals and accucheck at 0300. Will monitor.    Vitals:      BP: 112/78 (09/10/2021  5:31 PM)  Heart Rate: 100 (09/10/2021  5:31 PM)  Temp: 97.3 F (36.3 C) (09/10/2021  5:31 PM)  Resp Rate: 20 (09/10/2021  5:31 PM)   Oxygen Needs:    SpO2: 98 % (09/10/2021  5:31 PM)  O2 Device: None (Room air) (09/10/2021  5:31 PM)  O2 Flow Rate (L/min): 15 L/min (09/09/2021  6:54 PM)      Last 3 Weights:    Recent Weights for the past 720 hrs (Last 3 readings):   Weight   09/10/21 0626 85.4 kg (188 lb 4.4 oz)   09/04/21 2131 81.6 kg (180 lb)       Tele:   No order of VH TELEMETRY MONITORING is found.         Cardiac Rhythm: Normal Sinus Rhythm     Drips:         Current Facility-Administered Medications   Medication Dose Last Admin      I/O: Last 24 hours:    UOP:  Foley/Drain LDA(s):       Intake/Output Summary (Last 24 hours) at 09/11/2021 0523  Last data filed at 09/10/2021 2200  Gross per 24 hour   Intake 720 ml   Output 1050 ml   Net -330 ml         Drain Output:  Urethral Catheter (Active)   Catheter necessity reviewed? Yes 09/10/21 2200   Urine Output (mL) Total 1050 mL 09/10/21 2200    No order of INSERT FOLEY CATHETER is found.     No order of EXTERNAL URINARY CATHETER is found.     If there is a catheter present (internal or external)   Should we continue the catheter?  []  Remove   []  Keep           Last BM: Last BM Date: 09/10/21       Unmeasured Output This Shift:    No data found.     VTE PPX:       Current Facility-Administered Medications (Includes Only Anticoagulants)   Medication Dose Route Frequency Provider Last Rate Last Admin    enoxaparin (LOVENOX) syringe 40 mg  40 mg Subcutaneous Q24H Jannett Celestine, DPM           Skin Issues/Concerns:      Skin issues noted this shift:      Wound 09/05/21 Diabetic/ Neuropathy Foot Anterior;Left 4-40mm circular scabbed wound on top of foot (  Active)   09/05/21 0524 Foot   Wound Type: Diabetic/ Neuropathy   Pressure Injury Staging (WOCN/ Trained RNs Only):    Wound Location Orientation: Anterior;Left   Wound Description (Comments): 4-81mm circular scabbed wound on top of foot   Present on Admission: Yes   WOCN Wound Description (Comments):    Non-staged Wound Description:    Site Description Dressing covering site (UTA) 09/10/21 2200   Peri-wound Description Clean;Dry;Intact 09/10/21 2200   Dressing Ace Wrap 09/10/21 0800   Dressing Status Clean;Dry;Intact 09/10/21 2200       Wound 09/05/21 Diabetic/ Neuropathy Heel Left 1.5-2 inch in diameter open reddened/pink wound to pad of foot (Active)   09/05/21 0525 Heel   Wound Type: Diabetic/ Neuropathy   Pressure Injury Staging (WOCN/ Trained RNs Only):    Wound Location Orientation: Left   Wound Description (Comments): 1.5-2 inch in diameter open reddened/pink wound to pad of foot   Present on Admission: Yes   WOCN Wound Description (Comments):    Non-staged Wound Description:    Site Description Dressing covering site (UTA) 09/10/21 2200   Dressing Ace  Wrap 09/10/21 2200   Dressing Status Clean;Dry;Intact 09/10/21 2200       Wound 09/05/21 Surgical Incision Foot Left 4x4 gauze soaked in saline, kling and ace bandage, clean and dry (Active)   09/05/21 1855 Foot   Wound Type: Surgical Incision   Pressure Injury Staging (WOCN/ Trained RNs Only):    Wound Location Orientation: Left   Wound Description (Comments): 4x4 gauze soaked in saline, kling and ace bandage, clean and dry   Present on Admission:    WOCN Wound Description (Comments):    Non-staged Wound Description:    Site Description Dressing covering site (UTA) 09/10/21 2200   Closure Approximated 09/10/21 0800   Dressing Gauze Bandage Roll;Ace Wrap 09/10/21 2200   Dressing Changed New 09/05/21 1900   Dressing Status Clean;Dry;Intact 09/10/21 2200       Wound 09/09/21 Surgical Incision Other (Comment) Left Xeroform, 4x4's, kerlix, ace (Active)   09/09/21 1840 Other (Comment)   Wound Type: Surgical Incision   Pressure Injury Staging (WOCN/ Trained RNs Only):    Wound Location Orientation: Left   Wound Description (Comments): Xeroform, 4x4's, kerlix, ace   Present on Admission:    WOCN Wound Description (Comments):    Non-staged Wound Description:    Site Description Dressing covering site (UTA) 09/10/21 2200   Peri-wound Description Dressing covering site (UTA) 09/10/21 0944   Closure Dressing covering site (UTA) 09/10/21 0800   Drainage Amount Dressing covering site (UTA) 09/10/21 0800   Dressing Xeroform;Ace Wrap;Gauze;Gauze Bandage Roll 09/10/21 2200   Dressing Changed New 09/09/21 1900   Dressing Status Clean;Dry;Intact 09/10/21 2200         Chem Sticks (Hyper/Hypo)       Glucose POCT   Date Value Ref Range Status   09/10/2021 88 71 - 99 mg/dL Final     Comment:     The above 1 analytes were performed by [facility] [phone])  [address],[place] [address]         Mobility:        PMP Activity: Step 4 - Dangle at Bedside (09/10/2021 10:00 PM)      Falls Risk:   Risk Category:  Low      PT/OT (recommendations):      No data recorded    Care Act Designee to care for patient after discharge:      Designate  Care Provider?: Refused (09/05/2021  2:00 AM)      Care Partner(s) who will help during hospital stay:      No data recorded    Patient Rounding, Team Members Present    []  Rounds Occurred   []  Patient    []  MD    []  CM    []  Primary RN    []  Charge Nurse    []  Pharmacist      (In a few words, as a patient, do you have any goals or concerns you would like to share  with the team today? )     Patient stated goals and/or concerns:          Action plan:

## 2021-09-11 NOTE — Plan of Care (Signed)
Problem: Inadequate Cardiac Output  Goal: Adequate tissue perfusion will be maintained  Outcome: Progressing     Problem: Infection  Goal: Free from infection  Outcome: Progressing     Problem: Compromised skin integrity  Goal: Skin integrity is maintained or improved  Outcome: Progressing     Problem: Compromised Tissue integrity  Goal: Damaged tissue is healing and protected  Outcome: Progressing  Goal: Nutritional status is improving  Outcome: Progressing

## 2021-09-11 NOTE — Progress Notes (Signed)
Durable Medical Equipment   Delivery Date : 12/22   DME Ordered : FWW    DME Supplier : [employer]    Coordination Note :   Request sent for a front wheel walker as recommended by therapy. Dan Humphreys will be delivered to patient's bedside prior to deliver. If any questions about delivery, please contact [employer] Liaison at [phone]. If after hours or weekend, please call [phone], option #2.   Completed by :   Raechel Chute  Home Health - DME Coordinator  [id number]  [email or web address]

## 2021-09-11 NOTE — Discharge Summary (Addendum)
Medicine Discharge Summary   [facility]  [employer]   Patient Name: Arthur Kim   Attending Physician: Orville Govern, MD PCP: Marisa Sprinkles, MD   Date of Admission: 09/04/2021 D/C Date: 09/11/2021  09/11/2021   Discharge Diagnoses:   #Diabetic ulcer of the left foot with cellulitis/abscess  #Possible osteomyelitis of the fourth left toe  #Possible septic arthritis of the left fourth MTP joint  #Lactic acidosis  #Mild DKA present on admission  #Type 1 diabetes:  #Hypokalemia  #Hypomagnesemia  #History of coronary disease status post PCI and stent  #History of hypertension  #Prostatitis:  #BPH  #Chronic urinary retention       Hospital Course       Arthur Kim is a 40 y.o. male patient with past medical history of prostatitis with indwelling Foley catheter, diabetes, diabetic ketoacidosis, diabetic foot that was admitted on 09/04/2021 ulcerations presented with worsening left foot pain . He states He has been with having diabetic ulcer with recurrent infections requiring greater than a month hospitalization currently on doxycycline and Bactrim. 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       In the ED he was medically stable afebrile remarkable for leukocytosis, elevated CRP ESR, elevated lactic acid 2.5 x-ray left foot showed possible osteomyelitis of the left fourth toe.     MRI left foot showed Diabetic left foot infection. Small deep plantar soft tissue ulcer extending to the bone in the left forefoot with acute septic arthritis of the left fourth MTP joint and acute osteomyelitis with bone destruction of the adjacent proximal phalanx of the left fourth toe  and distal fourth metatarsal bone. Reactive bone marrow edema or acute osteomyelitis without bone destruction of the heads of the left second and fourth metatarsals    Podiatry consulted.  Underwent left foot I&D on 12/16.  MRI of left heel shows abscess, repeat I&D  on 12/20.  Culture growing MRSA, he has been managed on IV daptomycin ID plan for 4 weeks of therapy.  Anaerobic culture now positive for Bacteroides and p.o. Flagyl added.  Patient has had a Foley catheter in place and has not seen urology yet we discontinued this and he was able to void 400cc, PVR was 140  He has improved clinically.  He states he has his insulin and other chronic meds including aspirin Brilinta and Toprol-XL at home.  He will discharge home on IV antibiotics and home health services      Principal Problem:    Osteomyelitis of left foot  Resolved Problems:    * No resolved hospital problems. *     Pending Results and other significant studies:  None     Discharge Instructions:          Disposition: Home with Home Health  Diet: Cardiac Consistent Carbohydrates  Activity: NWB with walker Lt foot  Discharge Code Status: Full Code    [facility]  Dr. K.  Italy Adkins - PCP  Clinic Phone: [phone]  Follow up on 09/12/2021  An appointment for hospital follow up with your PCP is scheduled for  10:00 am.    Jannett Celestine, DPM  [address]  [place] [address]  [phone]    Follow up  Will need follow up appointment in 7 days for dressing change    LM [id number]       Discharge Medications:                                                                        Discharge Medication List        Taking      aspirin 81 MG chewable tablet  Dose: 81 mg  Chew 81 mg by mouth daily     colchicine 0.6 MG tablet  Dose: 0.6 mg  Take 0.6 mg by mouth daily     gabapentin 300 MG capsule  Dose: 300 mg  Commonly known as: NEURONTIN  Take 300 mg by mouth 3 (three) times daily     insulin aspart 100  UNIT/ML injection  Commonly known as: NovoLOG  Inject into the skin 3 (three) times daily before meals High dose sliding scale     insulin glargine 100 UNIT/ML injection  Dose: 50 Units  Commonly known as: LANTUS  Inject 50 Units into the skin every 12 (twelve) hours     isosorbide mononitrate 60 MG 24 hr tablet  Dose: 60 mg  Commonly known as: IMDUR  Take 60 mg by mouth daily     lisinopril 5 MG tablet  Dose: 5 mg  Commonly known as: ZESTRIL  Take 5 mg by mouth daily     metoprolol succinate XL 50 MG 24 hr tablet  Dose: 50 mg  Commonly known as: TOPROL-XL  Take 50 mg by mouth daily     metroNIDAZOLE 500 MG tablet  Dose: 500 mg  Commonly known as: FLAGYL  Take 1 tablet (500 mg) by mouth every 8 (eight) hours     oxyCODONE-acetaminophen 5-325 MG per tablet  Dose: 1 tablet  Commonly known as: PERCOCET  Take 1 tablet by mouth every 6 (six) hours as needed for Pain     ticagrelor 90 MG Tabs  Dose: 90 mg  Commonly known as: BRILINTA  Take 90 mg by mouth every 12 (twelve) hours               Discharge Day Exam (09/11/2021):     Blood pressure 114/73, pulse (!) 113, temperature 99.1 F (37.3 C), temperature source Temporal, resp. rate 20, height 1.753 m (5\' 9" ), weight 85.4 kg (188 lb 4.4 oz), SpO2 95 %.             General: Patient is awake. In no acute distress.  Chest: CTA bilaterally. No rhonchi, no wheezing. No use of accessory muscles.  CVS: Normal rate and regular rhythm no murmurs, without JVD.  Abdomen: Soft, non-tender, no guarding or rigidity, with normal bowel sounds.  Extremities: Lt foot dressing, pulses palpable, no calf swelling and no gross deformity.  Skin: Warm, dry  NEURO: No motor or sensory deficits.     Recent Labs      Recent Labs   Lab 09/11/21  0536 09/10/21  0506 09/09/21  0340 09/08/21  0441 09/07/21  0541   WBC 10.8 10.3 11.8* 8.6  8.0   RBC 3.74* 3.73* 3.99* 4.32 3.78*   Hemoglobin 10.2* 10.2* 10.7* 11.7* 10.3*   Hematocrit 31.7* 31.3* 33.4* 36.7* 32.1*   MCV 85 84 84 85 85   PLT CT 456* 366  335 375 292     Recent Labs   Lab 09/04/21  2215   PT 10.7   PT INR 1.0     Recent Labs   Lab 09/09/21  0340   Creatine Kinase (CK) 19*     Lab Results   Component Value Date    HGBA1CPERCNT 8.7 09/05/2021     Recent Labs   Lab 09/11/21  0536 09/10/21  0506 09/09/21  0340 09/08/21  0441 09/07/21  0541   Glucose 116* 269* 270* 114* 78   Sodium 140 140 134* 145 138   Potassium 4.0 3.6 3.6 4.0 3.1*   Chloride 105 107 103 109 107   CO2 25 23 24 26 23    BUN 10 10 10 10 10    Creatinine 0.68* 0.74* 0.74* 0.68* 0.64*   EGFR 122 119 119 122 124   Calcium 9.4 8.8 8.8 9.2 8.7     Recent Labs   Lab 09/10/21  0506 09/09/21  0340 09/08/21  0441 09/07/21  0541 09/06/21  0330 09/05/21  0310 09/04/21  2215   Magnesium 1.6 1.7 1.8 1.7 1.9 2.0 1.5*   Albumin  --   --  2.7* 2.4* 2.5* 2.6* 3.0*   Protein, Total  --   --  7.2 6.1 6.1 6.3 6.9   Bilirubin, Total  --   --  0.2 0.2 0.2 0.4 0.5   Alkaline Phosphatase  --   --  155* 147* 169* 188* 160*   ALT  --   --  9 10 13 20 13    AST (SGOT)  --   --  7* 7* 9* 23 7*        Allergies:      Patient has no known allergies.   Time spent on discharging the patient:  35 minutes   PICC Placement Single Lumen - VH    Result Date: 09/09/2021  Successful ultrasound guided venous access and fluoroscopically guided PICC insertion. ReadingStation:[email or web address]    XR Foot Left AP Lateral And Oblique    Result Date: 09/04/2021  There is periostitis of the fourth metatarsal with suggestion of osteomyelitis of the fourth metatarsal head and possibly base of the fourth proximal phalanx. I suspect that there is septic arthritis of the fourth MTP joint. There is cellulitis of the foot laterally and possible deep soft tissue gas. ReadingStation:[email or web address]    MRI Heel Left WO Contrast    Result Date: 09/08/2021  1.  Plantar skin and soft tissue ulcer at the mid-foot with packing material in place and a small amount of unencapsulated fluid extending from the base of the defect proximally over the medial cord of  the plantar aponeurosis. 2.  Plantar skin defect centered over the fourth MTP joint with a small amount of unencapsulated fluid tracking towards the plantar plate. Additional, larger dorsal defect with sinus tract communicating with an approximately 21 mm fluid collection superficial to the third and fourth MTP joints. 3.  Effusion at the fourth MTP joint, and significant marrow signal abnormalities suggesting septic arthritis and osteomyelitis. Probable pathologic nondisplaced fracture through the neck of the fourth metatarsal. 4.  Milder signal abnormalities at the second and third metatarsal heads and the third proximal phalanx, possibly reactive though early osteomyelitis is not excluded.  Evidence of a nondisplaced fracture at the base of the third proximal phalanx.  ReadingStation:[email or web address]    CT Abdomen Pelvis with IV Cont    Result Date: 09/05/2021  1. There is mild circumferential wall thickening of the rectum and distal sigmoid colon with mild adjacent stranding. This could be related to nondistention or represent a focal proctitis. No free fluid or abnormal gas collections are noted. Large amount  of gas and stool are seen in the proximal colon. 2. Foley catheter is present within the urinary bladder. There is mild stranding associated with the prostate and seminal vesicles as well and cannot exclude acute inflammation. 3. Dependent soft tissue edema. ReadingStation:[email or web address]    MRI Foot Left W WO Contrast    Result Date: 09/05/2021  1.  Diabetic left foot infection. 2.  Small deep plantar soft tissue ulcer extending to the bone in the left forefoot with acute septic arthritis of the left fourth MTP joint and acute osteomyelitis with bone destruction of the adjacent proximal phalanx of the left fourth toe and distal fourth metatarsal bone. 3.  Reactive bone marrow edema or acute osteomyelitis without bone destruction of the heads of the left second and fourth metatarsals. ReadingStation:[email or web address]     Home Health Needs:    Date I saw the patient face-to-face:    09/09/2021     IVs:    IV Antibiotics    Central Line Care     Evidence this patient is homebound because:    A.  Post operative restrictions, weight bearing status impedes mobility > 5 feet    E.  Requires assistance of another person or assistive device to ambulate > 5 feet     Medical conditions that necessitate Home Health care:    A.  Post operative procedure requiring follow up care & monitoring for complication    B.  Functional impairment due to recent hospitalization/procedure/treatment    C.  Risk for complication/infection/pain requiring follow up and monitoring    F.  New diagnosis & treatment requiring follow up monitoring and management    J.  Wound requiring assessment, monitoring and care    K.  Central line/IV infusion requiring monitoring and management     Per clinical findings, following services are medically necessary:    Skilled Nursing    PT     Clinical findings that support the need for Skilled Nursing. SN will:    A. Educate on post operative care, restrictions & management of complications    B. Monitor post operative site for infection and educate on care management    C. Monitor for signs and symptoms of exacerbation of disease and management    D. Review medication reconciliation, manage and educate on use and side effects    E. Educate on IV antibiotic administration and monitor response to treatment    H. Assess cardiopulmonary status and monitor for signs &symptoms of exacerbation    I.  Educate dietary and or fluid restrictions and weight management     Clinical findings that support the need for Physical Therapy. PT will:    A.  Evaluate and treat functional impairment and improve mobility      Orville Govern, MD         09/11/21 2:28 PM   MRN: [id number]  CSN: [id number] DOB: Jan 17, 1981

## 2021-09-11 NOTE — Progress Notes (Addendum)
HOME HEALTH ASSESSMENT        EST D/C DATE:         09-11-21, after 3 pm dose of daptomycin     HH AGENCY:   [facility], anticipated [facility] - 09-12-21 - 3 pm (PENDING WEATHER ISN'T TOO BAD)    HH UNSURE IF WILL BE ABLE TO SEE PT DUE TO BAD WEATHER, [facility] NURSE WILL TEACH PT TO INDEPENDENCE OF IV AT BEDSIDE PRIOR TO [place].    Patient open to [facility]    INFUSION CO:  [facility], anticipated [facility] pending    Pt will be able to do on own    IV medications to be delivered to home by end of day, costs reviewed with patient, agreeable.  $3.95/day    DME:  FW[facility]    DME coordinator, Jeannette How, to set up DME.  Set separate note for details.       NOTES: Reviewed chart, met with Pt at bedside. Discussed HH, answered questions.  Confirmed HH agency choice. Loaded notes/orders to [employer].       Home health liaison following         09/11/21 1036   Discharge Disposition   Patient preference/choice provided? Yes   Physical Discharge Disposition Home, Home Health   Name of Home Health Agency Placement Other (comment box)  ([facility])   Name of DME Agency [facility]   Outpatient Services   Home Health Skilled Nursing;Other (comment)  (PT)   CM Interventions   Referral made for home health RN visit? Yes           Wendall Papa, RN BSN  Home Health Liaison for [facility]   [phone]  [email or web address]

## 2021-09-11 NOTE — Progress Notes (Signed)
Nutrition Therapy  Nutrition Screen    Patient Information:     Name:Arthur Kim   Age: 40 y.o.   Sex: male     MRN: [id number]    Reason For Screen:     Length of Stay      Nutrition Assessment:       Pt admitted due to osteomyelitis of left foot.  Pt is on a consistent carbohydrate, heart healthy diet.  Pt was a good appetite.  PO intakes have been 100% for majority of meals.  Weight trend reviewed, no hx on file.  Pt denies any weight changes.  No s/s of malnutrition.          Nutrition Risk Level: Low      No further recommendations at this time.  RD to follow per policy, nutritional status change or MD Consult      Maryanna Shape Koni Kannan, RD  09/11/2021 12:31 PM

## 2021-09-11 NOTE — Progress Notes (Signed)
Patient:  Arthur Kim, Arthur Kim, 40 y.o. 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 Pt was asleep in bed. No s/s of distress at this time. Will continue to monitor.   0945: Pt received an update of POC during MDR. All questions answered at this time. Awaiting insurance authorization for home IV abx. Transportation will be set up by CM to tractor trailer. HH set up by liaison. Chronic foley was removed. Will perform PVR once pt voids.   1230: Pt was educated on IV infusion at home by [facility] RN. Attached extension on   PICC line and educate pt to flush IV and clamp line. Pt performed skill correctly. Pt void into urinal. 400 mL of urine voided and PVR was 157 mL.   1500: Administered final dose of IV abx and transport setup for 1600 via taxi.   1558: Went over pt's d/c'd AVS and answered all questions at this time. Joselyn Glassman will transport pt via wheelchair to main entrance.    Vitals:      BP: 130/85 (09/11/2021  7:59 AM)  Heart Rate: (!) 111 (09/11/2021  7:59 AM)  Temp: 99.9 F (37.7 C) (09/11/2021  7:59 AM)  Resp Rate: 18 (09/11/2021  7:59 AM)   Oxygen Needs:    SpO2: 100 % (09/11/2021  7:59 AM)  O2 Device: None (Room air) (09/11/2021  7:59 AM)  O2 Flow Rate (L/min): 15  L/min (09/09/2021  6:54 PM)      Last 3 Weights:    Recent Weights for the past 720 hrs (Last 3 readings):   Weight   09/10/21 0626 85.4 kg (188 lb 4.4 oz)   09/04/21 2131 81.6 kg (180 lb)       Tele:   No order of VH TELEMETRY MONITORING is found.         Cardiac Rhythm: Normal Sinus Rhythm     Drips:         Current Facility-Administered Medications   Medication Dose Last Admin      I/O: Last 24 hours:    UOP:  Foley/Drain LDA(s):       Intake/Output Summary (Last 24 hours) at 09/11/2021 0941  Last data filed at 09/11/2021 0800  Gross per 24 hour   Intake 1200 ml   Output 2250 ml   Net -1050 ml        Drain Output:  Urethral Catheter (Active)   Catheter necessity reviewed? Yes 09/10/21 2200   Urine Output (mL) Total 1050 mL 09/10/21 2200    No order of INSERT FOLEY CATHETER is found.     No order of EXTERNAL URINARY CATHETER is found.     If there is a catheter present (internal or external)   Should we continue the catheter?  [x]  Remove   []  Keep  Last BM: Last BM Date: 09/10/21       Unmeasured Output This Shift:    No data found.     VTE PPX:       Current Facility-Administered Medications (Includes Only Anticoagulants)   Medication Dose Route Frequency Provider Last Rate Last Admin    enoxaparin (LOVENOX) syringe 40 mg  40 mg Subcutaneous Q24H Jannett Celestine, DPM           Skin Issues/Concerns:      Skin issues noted this shift:      Wound 09/05/21 Diabetic/ Neuropathy Foot Anterior;Left 4-39mm circular scabbed wound on top of foot (Active)   09/05/21 0524 Foot   Wound Type: Diabetic/ Neuropathy   Pressure Injury Staging (WOCN/ Trained RNs Only):    Wound Location Orientation: Anterior;Left   Wound Description (Comments): 4-13mm circular scabbed wound on top of foot   Present on Admission: Yes   WOCN Wound Description (Comments):    Non-staged Wound Description:    Site Description Dressing covering site (UTA) 09/10/21 2200   Peri-wound Description Clean;Dry;Intact 09/10/21 2200   Dressing Ace  Wrap 09/10/21 0800   Dressing Status Clean;Dry;Intact 09/10/21 2200       Wound 09/05/21 Diabetic/ Neuropathy Heel Left 1.5-2 inch in diameter open reddened/pink wound to pad of foot (Active)   09/05/21 0525 Heel   Wound Type: Diabetic/ Neuropathy   Pressure Injury Staging (WOCN/ Trained RNs Only):    Wound Location Orientation: Left   Wound Description (Comments): 1.5-2 inch in diameter open reddened/pink wound to pad of foot   Present on Admission: Yes   WOCN Wound Description (Comments):    Non-staged Wound Description:    Site Description Dressing covering site (UTA) 09/10/21 2200   Dressing Ace Wrap 09/10/21 2200   Dressing Status Clean;Dry;Intact 09/10/21 2200       Wound 09/05/21 Surgical Incision Foot Left 4x4 gauze soaked in saline, kling and ace bandage, clean and dry (Active)   09/05/21 1855 Foot   Wound Type: Surgical Incision   Pressure Injury Staging (WOCN/ Trained RNs Only):    Wound Location Orientation: Left   Wound Description (Comments): 4x4 gauze soaked in saline, kling and ace bandage, clean and dry   Present on Admission:    WOCN Wound Description (Comments):    Non-staged Wound Description:    Site Description Dressing covering site (UTA) 09/10/21 2200   Closure Approximated 09/10/21 0800   Dressing Gauze Bandage Roll;Ace Wrap 09/10/21 2200   Dressing Changed New 09/05/21 1900   Dressing Status Clean;Dry;Intact 09/10/21 2200       Wound 09/09/21 Surgical Incision Other (Comment) Left Xeroform, 4x4's, kerlix, ace (Active)   09/09/21 1840 Other (Comment)   Wound Type: Surgical Incision   Pressure Injury Staging (WOCN/ Trained RNs Only):    Wound Location Orientation: Left   Wound Description (Comments): Xeroform, 4x4's, kerlix, ace   Present on Admission:    WOCN Wound Description (Comments):    Non-staged Wound Description:    Site Description Dressing covering site (UTA) 09/10/21 2200   Peri-wound Description Dressing covering site (UTA) 09/10/21 0944   Closure Dressing covering site (UTA)  09/10/21 0800   Drainage Amount Dressing covering site (UTA) 09/10/21 0800   Dressing Xeroform;Ace Wrap;Gauze;Gauze Bandage Roll 09/10/21 2200   Dressing Changed New 09/09/21 1900   Dressing Status Clean;Dry;Intact 09/10/21 2200         Chem Sticks (Hyper/Hypo)       Glucose POCT   Date Value Ref Range  Status   09/11/2021 193 (H) 71 - 99 mg/dL Final     Comment:     The above 1 analytes were performed by [facility] [phone])  [address],[place] [address]         Mobility:        PMP Activity: Step 4 - Dangle at Bedside (09/10/2021 10:00 PM)      Falls Risk:   Risk Category: Low      PT/OT (recommendations):      No data recorded    Care Act Designee to care for patient after discharge:      [facility] Provider?: Refused (09/05/2021  2:00 AM)      Care Partner(s) who will help during hospital stay:      No data recorded    Patient Rounding, Team Members Present    [x]  Rounds Occurred   [x]  Patient    [x]  MD    [x]  CM    [x]  Primary RN    [x]  Charge Nurse    [x]  Pharmacist      (In a few words, as a patient, do you have any goals or concerns you would like to share  with the team today? )     Patient stated goals and/or concerns:          Action plan: Insurance authorization for IV abx, transport to [facility], HH, wound vac, removal of foley, transition to PO pain mgnt

## 2021-09-11 NOTE — Plan of Care (Signed)
Problem: Inadequate Cardiac Output  Goal: Adequate tissue perfusion will be maintained  Outcome: Progressing     Problem: Infection  Goal: Free from infection  Outcome: Progressing     Problem: Compromised skin integrity  Goal: Skin integrity is maintained or improved  Outcome: Progressing     Problem: Compromised Tissue integrity  Goal: Damaged tissue is healing and protected  Outcome: Progressing  Goal: Nutritional status is improving  Outcome: Progressing     Problem: Moderate/High Fall Risk Score >5  Goal: Patient will remain free of falls  Outcome: Progressing     Problem: Diabetes: Glucose Imbalance  Goal: Blood glucose stable at established goal  Outcome: Progressing

## 2021-09-11 NOTE — Plan of Care (Signed)
Problem: Inadequate Cardiac Output  Goal: Adequate tissue perfusion will be maintained  09/11/2021 1559 by Dorene Sorrow I, LPN  Outcome: Completed  09/11/2021 1010 by Dorene Sorrow I, LPN  Outcome: Progressing     Problem: Infection  Goal: Free from infection  09/11/2021 1559 by Dorene Sorrow I, LPN  Outcome: Completed  09/11/2021 1010 by Dorene Sorrow I, LPN  Outcome: Progressing     Problem: Compromised skin integrity  Goal: Skin integrity is maintained or improved  09/11/2021 1559 by Dorene Sorrow I, LPN  Outcome: Completed  09/11/2021 1010 by Dorene Sorrow I, LPN  Outcome: Progressing     Problem: Compromised Tissue integrity  Goal: Damaged tissue is healing and protected  09/11/2021 1559 by Dorene Sorrow I, LPN  Outcome: Completed  09/11/2021 1010 by Dorene Sorrow I, LPN  Outcome: Progressing  Goal: Nutritional status is improving  09/11/2021 1559 by Dorene Sorrow I, LPN  Outcome: Completed  09/11/2021 1010 by Dorene Sorrow I, LPN  Outcome: Progressing     Problem: Moderate/High Fall Risk Score >5  Goal: Patient will remain free of falls  09/11/2021 1559 by Dorene Sorrow I, LPN  Outcome: Completed  09/11/2021 1010 by Dorene Sorrow I, LPN  Outcome: Progressing     Problem: Diabetes: Glucose Imbalance  Goal: Blood glucose stable at established goal  09/11/2021 1559 by Dorene Sorrow I, LPN  Outcome: Completed  09/11/2021 1010 by Dorene Sorrow I, LPN  Outcome: Progressing

## 2021-09-13 LAB — VH CULTURE, ANAEROBIC

## 2021-09-13 LAB — VH CULTURE AND SMEAR, TISSUE: Culture Result: NO GROWTH

## 2021-09-17 ENCOUNTER — Encounter (HOSPITAL_COMMUNITY): Payer: Self-pay

## 2021-09-17 ENCOUNTER — Emergency Department (HOSPITAL_COMMUNITY): Payer: Medicare Other

## 2021-09-17 ENCOUNTER — Other Ambulatory Visit: Payer: Self-pay

## 2021-09-17 ENCOUNTER — Observation Stay (HOSPITAL_COMMUNITY): Payer: Medicare Other | Admitting: Emergency Medicine

## 2021-09-17 ENCOUNTER — Observation Stay
Admission: EM | Admit: 2021-09-17 | Discharge: 2021-09-18 | Payer: Medicare Other | Attending: Emergency Medicine | Admitting: Emergency Medicine

## 2021-09-17 DIAGNOSIS — L089 Local infection of the skin and subcutaneous tissue, unspecified: Secondary | ICD-10-CM

## 2021-09-17 DIAGNOSIS — D509 Iron deficiency anemia, unspecified: Secondary | ICD-10-CM | POA: Diagnosis present

## 2021-09-17 DIAGNOSIS — L03116 Cellulitis of left lower limb: Secondary | ICD-10-CM | POA: Insufficient documentation

## 2021-09-17 DIAGNOSIS — E1165 Type 2 diabetes mellitus with hyperglycemia: Secondary | ICD-10-CM | POA: Diagnosis present

## 2021-09-17 DIAGNOSIS — E11621 Type 2 diabetes mellitus with foot ulcer: Secondary | ICD-10-CM | POA: Insufficient documentation

## 2021-09-17 DIAGNOSIS — E872 Acidosis, unspecified: Secondary | ICD-10-CM | POA: Diagnosis present

## 2021-09-17 DIAGNOSIS — Z794 Long term (current) use of insulin: Secondary | ICD-10-CM | POA: Insufficient documentation

## 2021-09-17 DIAGNOSIS — R339 Retention of urine, unspecified: Principal | ICD-10-CM | POA: Diagnosis present

## 2021-09-17 DIAGNOSIS — L03119 Cellulitis of unspecified part of limb: Secondary | ICD-10-CM | POA: Diagnosis present

## 2021-09-17 DIAGNOSIS — R739 Hyperglycemia, unspecified: Secondary | ICD-10-CM

## 2021-09-17 DIAGNOSIS — Z720 Tobacco use: Secondary | ICD-10-CM | POA: Insufficient documentation

## 2021-09-17 HISTORY — DX: Personal history of Methicillin resistant Staphylococcus aureus infection: Z86.14

## 2021-09-17 HISTORY — DX: ST elevation (STEMI) myocardial infarction of unspecified site (CMS HCC): I21.3

## 2021-09-17 HISTORY — DX: Type 1 diabetes mellitus without complications (CMS HCC): E10.9

## 2021-09-17 LAB — BASIC METABOLIC PANEL
ANION GAP: 10 mmol/L (ref 4–13)
BUN/CREA RATIO: 16 (ref 6–22)
BUN: 13 mg/dL (ref 8–25)
CALCIUM: 9.5 mg/dL (ref 8.5–10.0)
CHLORIDE: 103 mmol/L (ref 96–111)
CO2 TOTAL: 22 mmol/L (ref 22–30)
CREATININE: 0.83 mg/dL (ref 0.75–1.35)
ESTIMATED GFR: >90 mL/min/BSA (ref >=60)
GLUCOSE: 500 mg/dL (ref 65–125)
POTASSIUM: 4 mmol/L (ref 3.5–5.1)
SODIUM: 135 mmol/L — ABNORMAL LOW (ref 136–145)

## 2021-09-17 LAB — CBC WITH DIFF
BASOPHIL #: 0.1 10*3/uL (ref 0.00–0.20)
BASOPHIL %: 1 % (ref 0–2)
EOSINOPHIL #: 0.2 10*3/uL (ref 0.00–4.00)
EOSINOPHIL %: 2 % (ref 0–4)
HCT: 31.3 % — ABNORMAL LOW (ref 41.0–53.0)
HGB: 10.2 g/dL — ABNORMAL LOW (ref 14.0–16.0)
LYMPHOCYTE #: 1.6 10*3/uL (ref 1.00–3.80)
LYMPHOCYTE %: 16 % — ABNORMAL LOW (ref 20–35)
MCH: 25 pg — ABNORMAL LOW (ref 27.0–32.0)
MCHC: 32.4 g/dL (ref 32.0–36.0)
MCV: 77.3 fL — ABNORMAL LOW (ref 80.0–100.0)
MONOCYTE #: 0.6 10*3/uL (ref 0.00–1.40)
MONOCYTE %: 6 % (ref 3–15)
MPV: 7.2 fL (ref 6.6–9.3)
NEUTROPHIL #: 7.7 10*3/uL — ABNORMAL HIGH (ref 2.40–7.60)
NEUTROPHIL %: 76 % — ABNORMAL HIGH (ref 50–70)
PLATELETS: 678 10*3/uL — ABNORMAL HIGH (ref 140–450)
RBC: 4.05 10*6/uL — ABNORMAL LOW (ref 4.40–6.20)
RDW: 19.1 % — ABNORMAL HIGH (ref 12.0–15.0)
WBC: 10.1 10*3/uL (ref 4.8–10.8)

## 2021-09-17 LAB — CREATINE KINASE (CK), TOTAL, SERUM: CREATINE KINASE: 28 U/L — ABNORMAL LOW (ref 45–225)

## 2021-09-17 LAB — POC BLOOD GLUCOSE (RESULTS)
GLUCOSE, POC: 347 mg/dl — ABNORMAL HIGH (ref 60–110)
GLUCOSE, POC: 367 mg/dl — ABNORMAL HIGH (ref 60–110)

## 2021-09-17 LAB — URINALYSIS, MACRO/MICRO
BILIRUBIN: NEGATIVE mg/dL
BLOOD: NEGATIVE mg/dL
GLUCOSE: >=1000 mg/dL — AB
KETONES: NEGATIVE mg/dL
LEUKOCYTES: NEGATIVE WBCs/uL
NITRITE: NEGATIVE
PH: 6 (ref 4.5–8.0)
PROTEIN: NEGATIVE mg/dL
SPECIFIC GRAVITY: 1.015 (ref 1.001–1.035)
UROBILINOGEN: 0.2 mg/dL (ref 0.2–1.0)

## 2021-09-17 LAB — SEDIMENTATION RATE: ERYTHROCYTE SEDIMENTATION RATE (ESR): 86 mm/hr — ABNORMAL HIGH (ref <15)

## 2021-09-17 LAB — HEPATIC FUNCTION PANEL
ALBUMIN: 3 g/dL — ABNORMAL LOW (ref 3.5–5.0)
ALKALINE PHOSPHATASE: 107 U/L (ref 45–115)
ALT (SGPT): 8 U/L — ABNORMAL LOW (ref 10–55)
AST (SGOT): 8 U/L (ref 8–45)
BILIRUBIN DIRECT: 0.1 mg/dL (ref 0.1–0.4)
BILIRUBIN TOTAL: 0.1 mg/dL — ABNORMAL LOW (ref 0.3–1.3)
PROTEIN TOTAL: 7.6 g/dL (ref 6.4–8.3)

## 2021-09-17 LAB — LACTIC ACID TIMED: LACTIC ACID: 2.1 mmol/L (ref 0.5–2.2)

## 2021-09-17 LAB — C-REACTIVE PROTEIN(CRP),INFLAMMATION: CRP INFLAMMATION: 26.3 mg/L — ABNORMAL HIGH (ref <8.0)

## 2021-09-17 LAB — LACTIC ACID LEVEL W/ REFLEX FOR LEVEL >2.0: LACTIC ACID: 3 mmol/L — ABNORMAL HIGH (ref 0.5–2.2)

## 2021-09-17 MED ORDER — MAGNESIUM HYDROXIDE 400 MG/5 ML ORAL SUSPENSION
15.0000 mL | Freq: Every day | ORAL | Status: DC | PRN
Start: 2021-09-17 — End: 2021-09-18

## 2021-09-17 MED ORDER — LISINOPRIL 5 MG TABLET
5.0000 mg | ORAL_TABLET | Freq: Every day | ORAL | Status: DC
Start: 2021-09-18 — End: 2021-09-18
  Administered 2021-09-18: 5 mg via ORAL
  Filled 2021-09-17: qty 1

## 2021-09-17 MED ORDER — INSULIN REGULAR HUMAN 100 UNIT/ML INJECTION - CHARGE BY DOSE
6.0000 [IU] | INTRAMUSCULAR | Status: AC
Start: 2021-09-17 — End: 2021-09-17
  Administered 2021-09-17: 6 [IU] via INTRAVENOUS
  Filled 2021-09-17: qty 18

## 2021-09-17 MED ORDER — SODIUM CHLORIDE 0.9 % INTRAVENOUS SOLUTION
6.0000 mg/kg | INTRAVENOUS | Status: DC
Start: 2021-09-17 — End: 2021-09-18
  Administered 2021-09-17: 0 mg via INTRAVENOUS
  Administered 2021-09-17: 500 mg via INTRAVENOUS
  Filled 2021-09-17: qty 10

## 2021-09-17 MED ORDER — CIPROFLOXACIN 500 MG TABLET
500.0000 mg | ORAL_TABLET | ORAL | Status: AC
Start: 2021-09-17 — End: 2021-09-17
  Administered 2021-09-17: 500 mg via ORAL
  Filled 2021-09-17: qty 1

## 2021-09-17 MED ORDER — WATER FOR INJECTION, STERILE INJECTION SOLUTION
10.0000 mL | INTRAMUSCULAR | Status: DC
Start: 2021-09-17 — End: 2021-09-18
  Administered 2021-09-17: 10 mL

## 2021-09-17 MED ORDER — INSULIN GLARGINE-YFGN (U-100) 100 UNIT/ML SUBQ - CHARGE BY DOSE
50.0000 [IU] | Freq: Two times a day (BID) | SUBCUTANEOUS | Status: DC
Start: 2021-09-17 — End: 2021-09-18
  Administered 2021-09-17 – 2021-09-18 (×2): 50 [IU] via SUBCUTANEOUS

## 2021-09-17 MED ORDER — WATER FOR INJECTION, STERILE INJECTION SOLUTION
10.0000 mL | INTRAMUSCULAR | Status: DC
Start: 2021-09-17 — End: 2021-09-17

## 2021-09-17 MED ORDER — ISOSORBIDE MONONITRATE ER 60 MG TABLET,EXTENDED RELEASE 24 HR
60.0000 mg | ORAL_TABLET | Freq: Every morning | ORAL | Status: DC
Start: 2021-09-18 — End: 2021-09-18
  Administered 2021-09-18: 60 mg via ORAL
  Filled 2021-09-17: qty 1

## 2021-09-17 MED ORDER — GABAPENTIN 300 MG CAPSULE
300.0000 mg | ORAL_CAPSULE | Freq: Every evening | ORAL | Status: DC
Start: 2021-09-17 — End: 2021-09-18
  Administered 2021-09-17: 0 mg via ORAL

## 2021-09-17 MED ORDER — ENOXAPARIN 40 MG/0.4 ML SUB-Q SYRINGE - EAST
40.0000 mg | INJECTION | SUBCUTANEOUS | Status: DC
Start: 2021-09-17 — End: 2021-09-18
  Administered 2021-09-17: 0 mg via SUBCUTANEOUS

## 2021-09-17 MED ORDER — OXYCODONE-ACETAMINOPHEN 5 MG-325 MG TABLET
1.0000 | ORAL_TABLET | ORAL | Status: DC | PRN
Start: 2021-09-17 — End: 2021-09-18
  Administered 2021-09-17 – 2021-09-18 (×3): 1 via ORAL
  Filled 2021-09-17 (×3): qty 1

## 2021-09-17 MED ORDER — METOPROLOL SUCCINATE ER 50 MG TABLET,EXTENDED RELEASE 24 HR
50.0000 mg | ORAL_TABLET | Freq: Every day | ORAL | Status: DC
Start: 2021-09-18 — End: 2021-09-18
  Administered 2021-09-18: 50 mg via ORAL
  Filled 2021-09-17: qty 1

## 2021-09-17 MED ORDER — ONDANSETRON HCL (PF) 4 MG/2 ML INJECTION SOLUTION
4.0000 mg | INTRAMUSCULAR | Status: AC
Start: 2021-09-17 — End: 2021-09-17
  Administered 2021-09-17: 4 mg via INTRAVENOUS
  Filled 2021-09-17: qty 2

## 2021-09-17 MED ORDER — MORPHINE 4 MG/ML INJECTION WRAPPER
4.0000 mg | INJECTION | INTRAMUSCULAR | Status: AC
Start: 2021-09-17 — End: 2021-09-17
  Administered 2021-09-17: 4 mg via INTRAVENOUS
  Filled 2021-09-17: qty 1

## 2021-09-17 MED ORDER — ASPIRIN 81 MG CHEWABLE TABLET
81.0000 mg | CHEWABLE_TABLET | Freq: Every day | ORAL | Status: DC
Start: 2021-09-18 — End: 2021-09-18
  Administered 2021-09-18: 81 mg via ORAL
  Filled 2021-09-17: qty 1

## 2021-09-17 MED ORDER — INSULIN REGULAR HUMAN 100 UNIT/ML INJECTION SSIP
0.0000 [IU] | INJECTION | Freq: Four times a day (QID) | SUBCUTANEOUS | Status: DC
Start: 2021-09-18 — End: 2021-09-18
  Administered 2021-09-18: 0 [IU] via SUBCUTANEOUS
  Administered 2021-09-18: 12 [IU] via SUBCUTANEOUS
  Administered 2021-09-18: 4 [IU] via SUBCUTANEOUS
  Filled 2021-09-17: qty 12
  Filled 2021-09-17: qty 36

## 2021-09-17 MED ORDER — LACTATED RINGERS INTRAVENOUS SOLUTION
INTRAVENOUS | Status: DC
Start: 2021-09-17 — End: 2021-09-18

## 2021-09-17 MED ORDER — SODIUM CHLORIDE 0.9 % INTRAVENOUS SOLUTION
10.0000 mg/kg | INTRAVENOUS | Status: DC
Start: 2021-09-17 — End: 2021-09-17

## 2021-09-17 MED ORDER — KETOROLAC 30 MG/ML (1 ML) INJECTION SOLUTION
30.0000 mg | INTRAMUSCULAR | Status: DC
Start: 2021-09-17 — End: 2021-09-17
  Filled 2021-09-17: qty 1

## 2021-09-17 MED ORDER — METRONIDAZOLE 250 MG TABLET
500.0000 mg | ORAL_TABLET | Freq: Three times a day (TID) | ORAL | Status: DC
Start: 2021-09-17 — End: 2021-09-18
  Administered 2021-09-17 – 2021-09-18 (×3): 500 mg via ORAL
  Filled 2021-09-17 (×3): qty 2

## 2021-09-17 MED ORDER — KETOROLAC 30 MG/ML (1 ML) INJECTION SOLUTION
30.0000 mg | INTRAMUSCULAR | Status: AC
Start: 2021-09-17 — End: 2021-09-17
  Administered 2021-09-17: 30 mg via INTRAVENOUS

## 2021-09-17 MED ORDER — TICAGRELOR 90 MG TABLET
90.0000 mg | ORAL_TABLET | Freq: Two times a day (BID) | ORAL | Status: DC
Start: 2021-09-17 — End: 2021-09-18
  Administered 2021-09-17: 0 mg via ORAL
  Administered 2021-09-18: 90 mg via ORAL
  Filled 2021-09-17: qty 1

## 2021-09-17 MED ORDER — ONDANSETRON HCL (PF) 4 MG/2 ML INJECTION SOLUTION
4.0000 mg | Freq: Four times a day (QID) | INTRAMUSCULAR | Status: DC | PRN
Start: 2021-09-17 — End: 2021-09-18

## 2021-09-17 NOTE — ED Nurses Note (Signed)
Bladder scan completed per Dr. Request. Patient had 311 mL in his bladder.

## 2021-09-17 NOTE — Nurses Notes (Signed)
Patient arrived on floor via wheelchair accompanied by ED nurse.

## 2021-09-17 NOTE — ED Provider Notes (Addendum)
Department of Emergency Medicine  [facility]  09/17/2021        Patient is a 40 y.o.  male presenting to the ED with chief complaint of groin pain started to clocks morning getting worse during the day.  History of prostate trouble.  Reports that he is having some mucus the looking infection draining from his penis.     Location:  As above  Quality:  As above  Onset:  Today  Severity:  Moderate  Timing:  Still present  Context:  History a difficulty urinating  Modifying factors:  None  Associated symptoms:  As above    Review of Systems:    Constitutional: No fever, chills  Skin: No rashes or lesions  HENT: No sore throat, ear pain, or difficulty swallowing  Eyes: No vision changes, redness, discharge  Cardio: No chest pain, palpitations   Respiratory: No cough, wheezing or SOB  GI:  No nausea or vomiting. No diarrhea or constipation.  Positive lower abdominal pain  GU:  Positive dysuria, no hematuria, polyuria  MSK: No joint pain.  No neck or back pain  Neuro: No numbness, tingling, or weakness.  No headache  Psych: No SI or HI. Normal mood    All other symptoms reviewed and are negative, unless commented on in the HPI.     Past Medical History:  History reviewed. No pertinent past medical history.    Past Surgical History:  History reviewed. No pertinent surgical history.    Social History:  Social History     Tobacco Use    Smoking status: Never    Smokeless tobacco: Current   Substance Use Topics    Alcohol use: Never    Drug use: Never     Social History     Substance and Sexual Activity   Drug Use Never       Family History:  No family history on file.      Allergies and old records reviewed    Above history reviewed with patient.  Allergies, medication list, and old records also reviewed.     Filed Vitals:    09/17/21 1732   BP: (!) 168/109   Pulse: 88   Resp: 20   Temp: 36.1 C (97 F)   SpO2: 100%       Physical Exam:     Nursing note and vitals reviewed.  Vital signs reviewed as  above.  Moderate acute distress.   Constitutional: Pt is well-developed and well-nourished.   Head: Normocephalic and atraumatic.   Eyes: Conjunctivae are normal. Pupils are equal, round, and reactive to light. EOM are intact  Neck: Soft, supple, full range of motion.  Cardiovascular: RRR.  No Murmurs/rubs/gallops. Distal pulses present and equal bilaterally.  Pulmonary/Chest: Normal BS BL with no distress. No audible wheezes or crackles are noted.  Abdominal: Soft, nontender, nondistended.  No rebound, guarding, or masses.  Musculoskeletal: Normal range of motion. No deformities.  Exhibits no edema and no tenderness.   Neurological: CNs 2-12 grossly intact.  No focal deficits noted.  Skin:  Left foot in a walking shoe because of the infection  Psychiatric: Patient has a normal mood and affect.     Workup:     Labs:  Results for orders placed or performed during the hospital encounter of 09/17/21 (from the past 24 hour(s))   URINALYSIS WITH REFLEX MICROSCOPIC AND CULTURE IF POSITIVE    Specimen: Urine, Clean Catch    Narrative    The  following orders were created for panel order URINALYSIS WITH REFLEX MICROSCOPIC AND CULTURE IF POSITIVE.  Procedure                               Abnormality         Status                     ---------                               -----------         ------                     URINALYSIS, MACRO/MICRO[[id number]]      Abnormal            Final result                 Please view results for these tests on the individual orders.   CBC/DIFF    Narrative    The following orders were created for panel order CBC/DIFF.  Procedure                               Abnormality         Status                     ---------                               -----------         ------                     CBC WITH DIFF[[id number]]                Abnormal            Final result                 Please view results for these tests on the individual orders.   BASIC METABOLIC PANEL   Result Value Ref Range     SODIUM 135 (L) 136 - 145 mmol/L    POTASSIUM 4.0 3.5 - 5.1 mmol/L    CHLORIDE 103 96 - 111 mmol/L    CO2 TOTAL 22 22 - 30 mmol/L    ANION GAP 10 4 - 13 mmol/L    CALCIUM 9.5 8.5 - 10.0 mg/dL    GLUCOSE 500 (HH) 65 - 125 mg/dL    BUN 13 8 - 25 mg/dL    CREATININE 0.83 0.75 - 1.35 mg/dL    BUN/CREA RATIO 16 6 - 22    ESTIMATED GFR >90 >=60 mL/min/BSA   URINALYSIS, MACRO/MICRO   Result Value Ref Range    SPECIFIC GRAVITY 1.015 1.001 - 1.035    GLUCOSE >=1000 (A) Negative mg/dL    PROTEIN Negative Negative mg/dL    BILIRUBIN Negative Negative mg/dL    UROBILINOGEN 0.2 0.2 - 1.0 mg/dL    PH 6.0 4.5 - 8.0    BLOOD Negative Negative mg/dL    KETONES Negative Negative mg/dL    NITRITE Negative Negative    LEUKOCYTES Negative Negative WBCs/uL    APPEARANCE Clear Clear, Slightly Hazy    COLOR Yellow Yellow  CBC WITH DIFF   Result Value Ref Range    WBC 10.1 4.8 - 10.8 x103/uL    RBC 4.05 (L) 4.40 - 6.20 x106/uL    HGB 10.2 (L) 14.0 - 16.0 g/dL    HCT 31.3 (L) 41.0 - 53.0 %    MCV 77.3 (L) 80.0 - 100.0 fL    MCH 25.0 (L) 27.0 - 32.0 pg    MCHC 32.4 32.0 - 36.0 g/dL    RDW 19.1 (H) 12.0 - 15.0 %    PLATELETS 678 (H) 140 - 450 x103/uL    MPV 7.2 6.6 - 9.3 fL    NEUTROPHIL % 76 (H) 50 - 70 %    LYMPHOCYTE % 16 (L) 20 - 35 %    MONOCYTE % 6 3 - 15 %    EOSINOPHIL % 2 0 - 4 %    BASOPHIL % 1 0 - 2 %    NEUTROPHIL # 7.70 (H) 2.40 - 7.60 x103/uL    LYMPHOCYTE # 1.60 1.00 - 3.80 x103/uL    MONOCYTE # 0.60 0.00 - 1.40 x103/uL    EOSINOPHIL # 0.20 0.00 - 4.00 x103/uL    BASOPHIL # 0.10 0.00 - 0.20 x103/uL   LACTIC ACID LEVEL W/ REFLEX FOR LEVEL >2.0   Result Value Ref Range    LACTIC ACID 3.0 (H) 0.5 - 2.2 mmol/L       Imaging:         Abnormal Lab results:  Labs Reviewed   BASIC METABOLIC PANEL - Abnormal; Notable for the following components:       Result Value    SODIUM 135 (*)     GLUCOSE 500 (*)     All other components within normal limits   URINALYSIS, MACRO/MICRO - Abnormal; Notable for the following components:     GLUCOSE >=1000 (*)     All other components within normal limits   CBC WITH DIFF - Abnormal; Notable for the following components:    RBC 4.05 (*)     HGB 10.2 (*)     HCT 31.3 (*)     MCV 77.3 (*)     MCH 25.0 (*)     RDW 19.1 (*)     PLATELETS 678 (*)     NEUTROPHIL % 76 (*)     LYMPHOCYTE % 16 (*)     NEUTROPHIL # 7.70 (*)     All other components within normal limits   LACTIC ACID LEVEL W/ REFLEX FOR LEVEL >2.0 - Abnormal; Notable for the following components:    LACTIC ACID 3.0 (*)     All other components within normal limits   URINALYSIS WITH REFLEX MICROSCOPIC AND CULTURE IF POSITIVE    Narrative:     The following orders were created for panel order URINALYSIS WITH REFLEX MICROSCOPIC AND CULTURE IF POSITIVE.  Procedure                               Abnormality         Status                     ---------                               -----------         ------  URINALYSIS, MACRO/MICRO[[id number]]      Abnormal            Final result                 Please view results for these tests on the individual orders.   CBC/DIFF    Narrative:     The following orders were created for panel order CBC/DIFF.  Procedure                               Abnormality         Status                     ---------                               -----------         ------                     CBC WITH DIFF[[id number]]                Abnormal            Final result                 Please view results for these tests on the individual orders.   LACTIC ACID TIMED       .     Plan: Appropriate labs and imaging ordered. Medical Records reviewed.    MDM:   During the patient's stay in the emergency department, the above listed imaging and/or labs were performed to assist with medical decision making and were reviewed by myself when available for review.   Orders Placed This Encounter    URINALYSIS WITH REFLEX MICROSCOPIC AND CULTURE IF POSITIVE    CBC/DIFF    BASIC METABOLIC PANEL    URINALYSIS, MACRO/MICRO    CBC  WITH DIFF    LACTIC ACID LEVEL W/ REFLEX FOR LEVEL >2.0    LACTIC ACID TIMED    ketorolac (TORADOL) 30 mg/mL injection    ciprofloxacin (CIPRO) tablet    insulin R human 100 units/mL injection    ondansetron (ZOFRAN) 2 mg/mL injection    morphine 4 mg/mL injection     Pt remained stable throughout the emergency department course.      Impression:  Probable UTI or prostate infection and urinary retention 2. Lactic acidosis 3. Poorly controlled diabetes mellitus.  Plan:  Sepsis workup and urine.  Bladder scan.  Care turned over to Dr. Heloise Purpura

## 2021-09-17 NOTE — ED Triage Notes (Signed)
Patient reports that he has groin pain that started around 0200 this morning and gotten worse throughout the day. Patient reports that he sees urology for prostate problems. Patient reports difficulty emptying bladder.

## 2021-09-17 NOTE — H&P (Signed)
[facility]   Hospitalist  Admission H&P    Date of Service:  09/17/2021  [name], Joshua Jordan, 40 y.o. male  Date of Admission:  09/17/2021  Date of Birth:  1981-06-13  PCP: K Italy Adkins, DO          Information Obtained from: patient  Chief Complaint:  Pelvic pain    HPI: Joshua Jordan is a 40 y.o., White male who presents with pelvic pain, history of urinary retention multiple TURPs, is being treated with IV daptomycin for left diabetic foot infection, was found to be very hyperglycemic as well, patient was admitted for further evaluation/treatment.     PAST MEDICAL:    History reviewed. No pertinent past medical history.  See Medical record, reviewed last Admission   Reviewed pshx         Medications Prior to Admission     Prescriptions    aspirin 81 mg Oral Tablet, Chewable    Chew 1 Tablet (81 mg total) Once a day    colchicine 0.6 mg Oral Tablet    Take 1 Tablet (0.6 mg total) by mouth Once a day    gabapentin (NEURONTIN) 300 mg Oral Capsule    Take 1 Capsule (300 mg total) by mouth    insulin glargine 100 unit/mL Subcutaneous injection (vial)    Inject 50 Units under the skin Twice daily - in morning and at bedtime    isosorbide mononitrate (IMDUR) 60 mg Oral Tablet Sustained Release 24 hr    Take 1 Tablet (60 mg total) by mouth Every morning    lisinopriL (PRINIVIL) 5 mg Oral Tablet    Take 1 Tablet (5 mg total) by mouth Once a day    metoprolol succinate (TOPROL-XL) 50 mg Oral Tablet Sustained Release 24 hr    Take 1 Tablet (50 mg total) by mouth Once a day    metroNIDAZOLE (FLAGYL) 500 mg Oral Tablet    Take 1 Tablet (500 mg total) by mouth Four times a day    NOVOLOG U-100 INSULIN ASPART SUBQ    Inject under the skin Three times daily before meals    ticagrelor (BRILINTA) 90 mg Oral Tablet    Take 1 Tablet (90 mg total) by mouth Twice daily        No Known Allergies      Family History  Reviewed and non contributory      Social History  Social History     Tobacco Use    Smoking status: Never     Smokeless tobacco: Current   Substance Use Topics    Alcohol use: Never           ROS:  Complete review of systems carried out negative other than pertinent positives mentioned in HPI      Examination:  Temperature: 36.7 C (98.1 F) Heart Rate: (!) 105 BP (Non-Invasive): 115/78   Respiratory Rate: 18 SpO2: 100 %       Constitutional:  No distress and alert and oriented, chronically ill, frail, PICC line left arm  Eyes:  Conjunctiva clear, Pupils equal and round.   Neck:  Supple, symmetrical, trachea midline.  Respiratory:  Clear to auscultation bilaterally, no wheezes.  Cardiovascular:  Regular rate and rhythm, no murmurs.  Gastrointestinal:  Soft, non-tender, Bowel sounds normal, non-distended.  Musculoskeletal:  Head atraumatic and normocephalic.  Integumentary:  Chronic left foot infection, see media  Neurologic:  CN II - XII grossly intact, No tremor.  Labs:    CBC (Last 24 Hours):    Recent Results last 24 hours     09/17/21  1814   WBC 10.1   HGB 10.2*   HCT 31.3*   MCV 77.3*   PLTCNT 678*         Basic Metabolic Profile    Lab Results   Component Value Date/Time    SODIUM 135 (L) 09/17/2021 06:14 PM    POTASSIUM 4.0 09/17/2021 06:14 PM    CHLORIDE 103 09/17/2021 06:14 PM    CO2 22 09/17/2021 06:14 PM    ANIONGAP 10 09/17/2021 06:14 PM    Lab Results   Component Value Date/Time    BUN 13 09/17/2021 06:14 PM    CREATININE 0.83 09/17/2021 06:14 PM    GLUCOSENF 500 (HH) 09/17/2021 06:14 PM            Hepatic Function    Lab Results   Component Value Date/Time    ALBUMIN 3.0 (L) 09/17/2021 06:14 PM    TOTALPROTEIN 7.6 09/17/2021 06:14 PM    ALKPHOS 107 09/17/2021 06:14 PM    Lab Results   Component Value Date/Time    AST 8 09/17/2021 06:14 PM    ALT 8 (L) 09/17/2021 06:14 PM    BILIRUBINCON 0.1 09/17/2021 06:14 PM                    Imaging Studies:   XR FOOT LEFT   Final Result   1. Findings are suspicious and most likely represent osteomyelitis at the fourth metatarsophalangeal joint.   2.  Possible osteomyelitis of the proximal phalanx of the third digit.                              Radiologist location ID: [id number]               DNR Status:  Full Code    Assessment/Plan:   Active Hospital Problems    Diagnosis    Primary Problem: Cellulitis of foot     Urinary retention (acute on chronic)/chronic left diabetic foot infection/hyperglycemia without overt signs of DKA   -admit overnight for glycemic control, monitor urine output, continue home dapto/flagyl      Disposition Planning: DC home    Greig Right, DO

## 2021-09-17 NOTE — ED Nurses Note (Signed)
Telfa, gauze, rolled gauze, and ace wrap applied to left foot wounds. CRT < 3 sec. + PMS. Pt tolerated well.

## 2021-09-18 ENCOUNTER — Encounter (HOSPITAL_COMMUNITY): Payer: Self-pay | Admitting: Emergency Medicine

## 2021-09-18 ENCOUNTER — Observation Stay (HOSPITAL_COMMUNITY): Payer: Medicare Other

## 2021-09-18 DIAGNOSIS — E1165 Type 2 diabetes mellitus with hyperglycemia: Secondary | ICD-10-CM | POA: Diagnosis present

## 2021-09-18 DIAGNOSIS — E872 Acidosis, unspecified: Secondary | ICD-10-CM

## 2021-09-18 DIAGNOSIS — D509 Iron deficiency anemia, unspecified: Secondary | ICD-10-CM | POA: Diagnosis present

## 2021-09-18 DIAGNOSIS — R339 Retention of urine, unspecified: Secondary | ICD-10-CM | POA: Diagnosis present

## 2021-09-18 HISTORY — DX: Acidosis, unspecified: E87.20

## 2021-09-18 HISTORY — DX: Retention of urine, unspecified: R33.9

## 2021-09-18 HISTORY — DX: Iron deficiency anemia, unspecified: D50.9

## 2021-09-18 HISTORY — DX: Type 2 diabetes mellitus with hyperglycemia (CMS HCC): E11.65

## 2021-09-18 LAB — CBC WITH DIFF
BASOPHIL #: 0.1 10*3/uL (ref 0.00–0.20)
BASOPHIL %: 1 % (ref 0–2)
EOSINOPHIL #: 0.4 10*3/uL (ref 0.00–4.00)
EOSINOPHIL %: 5 % — ABNORMAL HIGH (ref 0–4)
HCT: 28.9 % — ABNORMAL LOW (ref 41.0–53.0)
HGB: 9.5 g/dL — ABNORMAL LOW (ref 14.0–16.0)
LYMPHOCYTE #: 1.8 10*3/uL (ref 1.00–3.80)
LYMPHOCYTE %: 24 % (ref 20–35)
MCH: 25.4 pg — ABNORMAL LOW (ref 27.0–32.0)
MCHC: 33 g/dL (ref 32.0–36.0)
MCV: 77 fL — ABNORMAL LOW (ref 80.0–100.0)
MONOCYTE #: 0.6 10*3/uL (ref 0.00–1.40)
MONOCYTE %: 7 % (ref 3–15)
MPV: 6.9 fL (ref 6.6–9.3)
NEUTROPHIL #: 4.8 10*3/uL (ref 2.40–7.60)
NEUTROPHIL %: 63 % (ref 50–70)
PLATELETS: 592 10*3/uL — ABNORMAL HIGH (ref 140–450)
RBC: 3.75 10*6/uL — ABNORMAL LOW (ref 4.40–6.20)
RDW: 19.3 % — ABNORMAL HIGH (ref 12.0–15.0)
WBC: 7.6 10*3/uL (ref 4.8–10.8)

## 2021-09-18 LAB — COMPREHENSIVE METABOLIC PANEL, NON-FASTING
ALBUMIN: 2.6 g/dL — ABNORMAL LOW (ref 3.5–5.0)
ALKALINE PHOSPHATASE: 103 U/L (ref 45–115)
ALT (SGPT): 7 U/L — ABNORMAL LOW (ref 10–55)
ANION GAP: 7 mmol/L (ref 4–13)
AST (SGOT): 6 U/L — ABNORMAL LOW (ref 8–45)
BILIRUBIN TOTAL: 0.1 mg/dL — ABNORMAL LOW (ref 0.3–1.3)
BUN/CREA RATIO: 21 (ref 6–22)
BUN: 16 mg/dL (ref 8–25)
CALCIUM: 9 mg/dL (ref 8.5–10.0)
CHLORIDE: 106 mmol/L (ref 96–111)
CO2 TOTAL: 26 mmol/L (ref 22–30)
CREATININE: 0.77 mg/dL (ref 0.75–1.35)
ESTIMATED GFR: >90 mL/min/BSA (ref >=60)
GLUCOSE: 306 mg/dL — ABNORMAL HIGH (ref 65–125)
POTASSIUM: 3.8 mmol/L (ref 3.5–5.1)
PROTEIN TOTAL: 6.7 g/dL (ref 6.4–8.3)
SODIUM: 139 mmol/L (ref 136–145)

## 2021-09-18 LAB — POC BLOOD GLUCOSE (RESULTS)
GLUCOSE, POC: 275 mg/dl — ABNORMAL HIGH (ref 60–110)
GLUCOSE, POC: 145 mg/dl — ABNORMAL HIGH (ref 60–110)
GLUCOSE, POC: 161 mg/dl — ABNORMAL HIGH (ref 60–110)

## 2021-09-18 LAB — PROCALCITONIN: PROCALCITONIN: 0.02 ng/mL (ref <0.50)

## 2021-09-18 LAB — CREATINE KINASE (CK), TOTAL, SERUM: CREATINE KINASE: 21 U/L — ABNORMAL LOW (ref 45–225)

## 2021-09-18 LAB — WOUND, SUPERFICIAL/NON-STERILE SITE, AEROBIC CULTURE AND GRAM STAIN

## 2021-09-18 MED ORDER — OXYCODONE-ACETAMINOPHEN 5 MG-325 MG TABLET
1.0000 | ORAL_TABLET | ORAL | 0 refills | Status: AC | PRN
Start: 2021-09-18 — End: 2021-10-04

## 2021-09-18 MED ORDER — KETOROLAC 30 MG/ML (1 ML) INJECTION SOLUTION
15.0000 mg | INTRAMUSCULAR | Status: AC
Start: 2021-09-18 — End: 2021-09-18
  Administered 2021-09-18: 15 mg via INTRAVENOUS
  Filled 2021-09-18: qty 1

## 2021-09-18 MED ORDER — ONDANSETRON 4 MG DISINTEGRATING TABLET
4.0000 mg | ORAL_TABLET | Freq: Three times a day (TID) | ORAL | 0 refills | Status: AC | PRN
Start: 2021-09-18 — End: ?

## 2021-09-18 MED ORDER — OXYCODONE 5 MG TABLET
10.0000 mg | ORAL_TABLET | ORAL | Status: AC
Start: 2021-09-18 — End: 2021-09-18
  Administered 2021-09-18: 10 mg via ORAL
  Filled 2021-09-18: qty 2

## 2021-09-18 NOTE — Care Plan (Signed)
Care plan initiated.  Admitted for cellulitis of L foot.  Oriented to room and call light usage.  Rested intermittently this shift.  C/o pain.  Meds per MAR.  Monitor VS, labs, pain.    Problem: Adult Inpatient Plan of Care  Goal: Optimal Comfort and Wellbeing  Outcome: Ongoing (see interventions/notes)     Problem: Skin or Soft Tissue Infection  Goal: Absence of Infection Signs and Symptoms  Outcome: Ongoing (see interventions/notes)     Problem: Pain Acute  Goal: Optimal Pain Control and Function  Outcome: Unable to achieve outcome at discharge (see discharge plan/notes)

## 2021-09-18 NOTE — Progress Notes (Signed)
[facility]  FAMILY MEDICINE PROGRESS NOTE    Joshua Jordan, Joshua Jordan, 40 y.o. male  Date of Admission:  09/17/2021  Date of service: 09/18/2021  Date of Birth:  05-10-1981  Hospital Day:  LOS: 0 days         Subjective :   He still has some groin pain but overall is improved today.  He is diabetic foot ulcer really does look too bad  Current Medications:  aspirin chewable tablet 81 mg, 81 mg, Oral, Daily  DAPTOmycin (CUBICIN RF) 500 mg in NS 50 mL IVPB, 6 mg/kg (Adjusted), Intravenous, Q24H  enoxaparin (LOVENOX) 40 mg/0.4 mL SubQ injection, 40 mg, Subcutaneous, Q24H  gabapentin (NEURONTIN) capsule, 300 mg, Oral, QPM  insulin glargine-yfgn 100 units/mL injection, 50 Units, Subcutaneous, 2x/day  isosorbide mononitrate (IMDUR) 24 hr extended release tablet, 60 mg, Oral, QAM  lisinopril (PRINIVIL) tablet, 5 mg, Oral, Daily  LR premix infusion, , Intravenous, Continuous  magnesium hydroxide (MILK OF MAGNESIA) 418m per 5105moral liquid, 15 mL, Oral, Daily PRN  metoprolol succinate (TOPROL-XL) 24 hr extended release tablet, 50 mg, Oral, Daily  metroNIDAZOLE (FLAGYL) tablet, 500 mg, Oral, 3x/day  ondansetron (ZOFRAN) 2 mg/mL injection, 4 mg, Intravenous, Q6H PRN  oxyCODONE-acetaminophen (PERCOCET) 5-32540mer tablet, 1 Tablet, Oral, Q4H PRN  SSIP insulin R human (HUMULIN R) 100 units/mL injection, 0-12 Units, Subcutaneous, 4x/day AC  SW vial Solution 10 mL, 10 mL, Other, Q24H  ticagrelor (BRILINTA) tablet, 90 mg, Oral, 2x/day        No Known Allergies    Objective     Vital Signs:  Systolic (24h38UEKAvgCMK:349Min:107 , MaxZPH:150  Diastolic (24h56PVXAvgYIA:16in:63, Max:109    Temp  Avg: 36.4 C (97.5 F)  Min: 36.1 C (97 F)  Max: 36.7 C (98.1 F)  MAP (Non-Invasive)  Avg: 110.5 mmHG  Min: 96 mmHG  Max: 125 mmHG  Pulse  Avg: 84.5  Min: 74  Max: 105  Resp  Avg: 18.6  Min: 16  Max: 20  SpO2  Avg: 99.1 %  Min: 96 %  Max: 100 %       I/O:  I/O last 24 hours:      Intake/Output Summary (Last 24 hours) at 09/18/2021  1711  Last data filed at 09/18/2021 1308  Gross per 24 hour   Intake 1039.6 ml   Output 1200 ml   Net -160.4 ml     I/O current shift:  12/29 0700 - 12/29 1859  In: 600 [P.O.:600]  Out: 400 [Urine:400]  Point of Care Glucose Last 24 Hr Results:  No results for input(s): GLUCOSEPOC in the last 24 hours.  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      Psychiatric:  Mood is good     Labs:  Results for orders placed or performed during the hospital encounter of 09/17/21 (from the past 24 hour(s))   BASIC METABOLIC PANEL   Result Value Ref Range    SODIUM 135 (L) 136 - 145 mmol/L    POTASSIUM 4.0 3.5 - 5.1 mmol/L    CHLORIDE 103 96 - 111 mmol/L  CO2 TOTAL 22 22 - 30 mmol/L    ANION GAP 10 4 - 13 mmol/L    CALCIUM 9.5 8.5 - 10.0 mg/dL    GLUCOSE 500 (HH) 65 - 125 mg/dL    BUN 13 8 - 25 mg/dL    CREATININE 0.83 0.75 - 1.35 mg/dL    BUN/CREA RATIO 16 6 - 22    ESTIMATED GFR >90 >=60 mL/min/BSA   URINALYSIS, MACRO/MICRO   Result Value Ref Range    SPECIFIC GRAVITY 1.015 1.001 - 1.035    GLUCOSE >=1000 (A) Negative mg/dL    PROTEIN Negative Negative mg/dL    BILIRUBIN Negative Negative mg/dL    UROBILINOGEN 0.2 0.2 - 1.0 mg/dL    PH 6.0 4.5 - 8.0    BLOOD Negative Negative mg/dL    KETONES Negative Negative mg/dL    NITRITE Negative Negative    LEUKOCYTES Negative Negative WBCs/uL    APPEARANCE Clear Clear, Slightly Hazy    COLOR Yellow Yellow   CBC WITH DIFF   Result Value Ref Range    WBC 10.1 4.8 - 10.8 x103/uL    RBC 4.05 (L) 4.40 - 6.20 x106/uL    HGB 10.2 (L) 14.0 - 16.0 g/dL    HCT 31.3 (L) 41.0 - 53.0 %    MCV 77.3 (L) 80.0 - 100.0 fL    MCH 25.0 (L) 27.0 - 32.0 pg    MCHC 32.4 32.0 - 36.0 g/dL    RDW 19.1 (H) 12.0 - 15.0 %    PLATELETS 678 (H) 140 - 450 x103/uL    MPV 7.2 6.6 - 9.3  fL    NEUTROPHIL % 76 (H) 50 - 70 %    LYMPHOCYTE % 16 (L) 20 - 35 %    MONOCYTE % 6 3 - 15 %    EOSINOPHIL % 2 0 - 4 %    BASOPHIL % 1 0 - 2 %    NEUTROPHIL # 7.70 (H) 2.40 - 7.60 x103/uL    LYMPHOCYTE # 1.60 1.00 - 3.80 x103/uL    MONOCYTE # 0.60 0.00 - 1.40 x103/uL    EOSINOPHIL # 0.20 0.00 - 4.00 x103/uL    BASOPHIL # 0.10 0.00 - 0.20 x103/uL   LACTIC ACID LEVEL W/ REFLEX FOR LEVEL >2.0   Result Value Ref Range    LACTIC ACID 3.0 (H) 0.5 - 2.2 mmol/L   SEDIMENTATION RATE   Result Value Ref Range    ERYTHROCYTE SEDIMENTATION RATE (ESR) 86 (H) <15 mm/hr   C-REACTIVE PROTEIN(CRP),INFLAMMATION   Result Value Ref Range    CRP INFLAMMATION 26.3 (H) <8.0 mg/L   HEPATIC FUNCTION PANEL   Result Value Ref Range    ALBUMIN 3.0 (L) 3.5 - 5.0 g/dL     ALKALINE PHOSPHATASE 107 45 - 115 U/L    ALT (SGPT) 8 (L) 10 - 55 U/L    AST (SGOT)  8 8 - 45 U/L    BILIRUBIN TOTAL 0.1 (L) 0.3 - 1.3 mg/dL    BILIRUBIN DIRECT 0.1 0.1 - 0.4 mg/dL    PROTEIN TOTAL 7.6 6.4 - 8.3 g/dL   CREATINE KINASE (CK), TOTAL, SERUM - TODAY & WEEKLY   Result Value Ref Range    CREATINE KINASE 28 (L) 45 - 225 U/L   WOUND, SUPERFICIAL/NON-STERILE SITE, AEROBIC CULTURE AND GRAM STAIN    Specimen: Wound; Other   Result Value Ref Range    GRAM STAIN 3+ Several Gram Positive Cocci/Clusters    POC BLOOD GLUCOSE (  RESULTS)   Result Value Ref Range    GLUCOSE, POC 367 (H) 60 - 110 mg/dl   LACTIC ACID TIMED   Result Value Ref Range    LACTIC ACID 2.1 0.5 - 2.2 mmol/L   POC BLOOD GLUCOSE (RESULTS)   Result Value Ref Range    GLUCOSE, POC 347 (H) 60 - 110 mg/dl   POC BLOOD GLUCOSE (RESULTS)   Result Value Ref Range    GLUCOSE, POC 275 (H) 60 - 110 mg/dl   CREATINE KINASE (CK), TOTAL, SERUM - TODAY & WEEKLY   Result Value Ref Range    CREATINE KINASE 21 (L) 45 - 225 U/L   COMPREHENSIVE METABOLIC PANEL, NON-FASTING   Result Value Ref Range    SODIUM 139 136 - 145 mmol/L    POTASSIUM 3.8 3.5 - 5.1 mmol/L    CHLORIDE 106 96 - 111 mmol/L    CO2 TOTAL 26 22 - 30 mmol/L     ANION GAP 7 4 - 13 mmol/L    BUN 16 8 - 25 mg/dL    CREATININE 0.77 0.75 - 1.35 mg/dL    BUN/CREA RATIO 21 6 - 22    ESTIMATED GFR >90 >=60 mL/min/BSA    ALBUMIN 2.6 (L) 3.5 - 5.0 g/dL     CALCIUM 9.0 8.5 - 10.0 mg/dL    GLUCOSE 306 (H) 65 - 125 mg/dL    ALKALINE PHOSPHATASE 103 45 - 115 U/L    ALT (SGPT) 7 (L) 10 - 55 U/L    AST (SGOT)  6 (L) 8 - 45 U/L    BILIRUBIN TOTAL 0.1 (L) 0.3 - 1.3 mg/dL    PROTEIN TOTAL 6.7 6.4 - 8.3 g/dL   CBC WITH DIFF   Result Value Ref Range    WBC 7.6 4.8 - 10.8 x103/uL    RBC 3.75 (L) 4.40 - 6.20 x106/uL    HGB 9.5 (L) 14.0 - 16.0 g/dL    HCT 28.9 (L) 41.0 - 53.0 %    MCV 77.0 (L) 80.0 - 100.0 fL    MCH 25.4 (L) 27.0 - 32.0 pg    MCHC 33.0 32.0 - 36.0 g/dL    RDW 19.3 (H) 12.0 - 15.0 %    PLATELETS 592 (H) 140 - 450 x103/uL    MPV 6.9 6.6 - 9.3 fL    NEUTROPHIL % 63 50 - 70 %    LYMPHOCYTE % 24 20 - 35 %    MONOCYTE % 7 3 - 15 %    EOSINOPHIL % 5 (H) 0 - 4 %    BASOPHIL % 1 0 - 2 %    NEUTROPHIL # 4.80 2.40 - 7.60 x103/uL    LYMPHOCYTE # 1.80 1.00 - 3.80 x103/uL    MONOCYTE # 0.60 0.00 - 1.40 x103/uL    EOSINOPHIL # 0.40 0.00 - 4.00 x103/uL    BASOPHIL # 0.10 0.00 - 0.20 x103/uL   POC BLOOD GLUCOSE (RESULTS)   Result Value Ref Range    GLUCOSE, POC 145 (H) 60 - 110 mg/dl   POC BLOOD GLUCOSE (RESULTS)   Result Value Ref Range    GLUCOSE, POC 161 (H) 60 - 110 mg/dl       Nutrition/Residuals:  DIET DIABETIC      Radiology Results: I have reviewed radiology results.    Microbiology: I have reviewed microbiology results.        Assessment/ Plan:   Urinary retention  Iron deficiency anemia  Poorly controlled diabetes mellitus  type 2  Probable osteomyelitis left foot  Active Hospital Problems    Diagnosis    Primary Problem: Cellulitis of foot     DVT/PE  not indicate  Disposition Planning: Home discharge    Discharge on Cipro and follow-up with Urology.  Needs follow-up with his infectious disease doctor about his foot and I suspect he has osteomyelitis with 86 sedimentation  rate    Zenon Mayo Galvin Aversa, MD

## 2021-09-18 NOTE — Discharge Summary (Addendum)
[facility]  DISCHARGE SUMMARY    PATIENT NAME:  Joshua Jordan, Joshua Jordan  MRN:  [id number]  DOB:  October 02, 1980    ENCOUNTER DATE:  09/17/2021  INPATIENT ADMISSION DATE:   DISCHARGE DATE:  09/18/2021    ATTENDING PHYSICIAN: Thereasa Parkin Patr*  SERVICE: [facility]UR[facility]  PRIMARY CARE PHYSICIAN: Kirtland Bouchard Italy Adkins, DO         LAY CAREGIVER:  ,  ,        PRIMARY DISCHARGE DIAGNOSIS: Cellulitis of foot  Active Hospital Problems    Diagnosis Date Noted    Principle Problem: Cellulitis of foot [L03.119] 09/17/2021    Urinary retention [R33.9] 09/18/2021    Lactic acidosis [E87.20] 09/18/2021    Poorly controlled type 2 diabetes mellitus (CMS HCC) [E11.65] 09/18/2021    Iron deficiency anemia [D50.9] 09/18/2021      Resolved Hospital Problems   No resolved problems to display.     There are no active non-hospital problems to display for this patient.       DISCHARGE MEDICATIONS:     Current Discharge Medication List      START taking these medications.      Details   ondansetron 4 mg Tablet, Rapid Dissolve  Commonly known as: ZOFRAN ODT   4 mg, Oral, EVERY 8 HOURS PRN  Qty: 20 Tablet  Refills: 0     oxyCODONE-acetaminophen 5-325 mg Tablet  Commonly known as: PERCOCET   1 Tablet, Oral, EVERY 4 HOURS PRN  Qty: 16 Tablet  Refills: 0        CONTINUE these medications - NO CHANGES were made during your visit.      Details   aspirin 81 mg Tablet, Chewable   81 mg, Oral, DAILY  Refills: 0     colchicine 0.6 mg Tablet   0.6 mg, Oral, DAILY  Refills: 0     gabapentin 300 mg Capsule  Commonly known as: NEURONTIN   300 mg, Oral  Refills: 0     insulin glargine 100 unit/mL injection (vial)   50 Units, Subcutaneous, 2 TIMES DAILY MORNING/BEDTIME  Refills: 0     isosorbide mononitrate 60 mg Tablet Sustained Release 24 hr  Commonly known as: IMDUR   60 mg, Oral, EVERY MORNING  Refills: 0     lisinopriL 5 mg Tablet  Commonly known as: PRINIVIL   5 mg, Oral, DAILY  Refills: 0     metoprolol succinate 50 mg Tablet Sustained Release 24  hr  Commonly known as: TOPROL-XL   50 mg, Oral, DAILY  Refills: 0     metroNIDAZOLE 500 mg Tablet  Commonly known as: FLAGYL   500 mg, Oral, 4 TIMES DAILY  Refills: 0     NOVOLOG U-100 INSULIN ASPART SUBQ   Subcutaneous, 3 TIMES DAILY BEFORE MEALS  Refills: 0     ticagrelor 90 mg Tablet  Commonly known as: BRILINTA   90 mg, Oral, DAILY  Refills: 0          Discharge med list refreshed?  YES                     ALLERGIES:  No Known Allergies          HOSPITAL PROCEDURE(S):   Bedside Procedures:  No orders of the defined types were placed in this encounter.    Surgical       REASON FOR HOSPITALIZATION AND HOSPITAL COURSE     BRIEF HPI:  This is a 40 y.o.,  male admitted for lactic acidosis diabetic foot ulcer and urinary retention    BRIEF HOSPITAL NARRATIVE:         He appeared to be quite uncomfortable in the emergency department.  A urinary catheter was inserted because he had 300 cc of urinary retention.  His foot really did look all that bad but his sed rate is 86.  I think he needs continued treatment for that and probably has osteomyelitis.  He is doing better today.  He has iron deficiency anemia also.  He needs to follow-up with a urologist that he has seen before in [place] on Monday.  He needs follow-up with his family doctor about osteomyelitis foot.  He needs to continue to monitor his glucose.     TRANSITION/POST DISCHARGE CARE/PENDING TESTS/REFERRALS:  Follow-up with Urology.  Follow-up with  PCP.  You need to see them as soon as possible at least Monday.  Foley care        CONDITION ON DISCHARGE:  A. Ambulation: Full ambulation  B. Self-care Ability: Complete  C. Cognitive Status Alert  D. Code status at discharge:             LINES/DRAINS/WOUNDS AT DISCHARGE:   Patient Lines/Drains/Airways Status     Active Line / Dialysis Catheter / Dialysis Graft / Drain / Airway / Wound     Name Placement date Placement time Site Days    PICC Single Lumen Left;Basilic Vein 09/08/21  0000  --  10    Foley Catheter  09/17/21  1843  -- less than 1    Wound (Non-Surgical) Left Heel 09/18/21  0700  -- less than 1    Surgical Incision Anterior;Left Foot 09/18/21  0700  -- less than 1                DISCHARGE DISPOSITION:  Home discharge              DISCHARGE INSTRUCTIONS:  Post-Discharge Follow Up Appointments     Follow up with Adkins, K Italy, DO    Phone: [phone]    Where: [address], [place] [address]           DISCHARGE INSTRUCTION - DIABETIC DIET     Diet: DIABETIC DIET      DISCHARGE INSTRUCTION - CARDIAC DIET     Diet: CARDIAC DIET      DISCHARGE INSTRUCTION - ACTIVITY     Activity: AS TOLERATED      DISCHARGE INSTRUCTION - MISC    You need to follow-up with your urologist Monday.  Foley care instruction.  Continue the dapsone     DME - DIABETIC SUPPLIES    Glucometer and strips.  Check blood sugar a.c. and HS.  Diagnosis poorly controlled diabetes mellitus type 2 on insulin  E11.8     Freedom of Choice: I have informed patient of their freedom of choice with respect to DME providers                 Wynelle Beckmann Camary Sosa, MD    Copies sent to Care Team       Relationship Specialty Notifications Start End    Adkins, K Italy, DO PCP - General FAMILY MEDICINE  09/17/21     Phone: [phone] Fax: [phone]         [address] [place] [address]            Referring providers can utilize [email or web address] to access their referred [facility] patient's  information.

## 2021-09-18 NOTE — Nurses Notes (Signed)
Patient discharged home with family.  AVS reviewed with patient/care giver.  A written copy of the AVS and discharge instructions was given to the patient/care giver.  Questions sufficiently answered as needed.  Patient/care giver encouraged to follow up with PCP as indicated.  In the event of an emergency, patient/care giver instructed to call 911 or go to the nearest emergency room. PICC and foley catheter left in per MD instructions. Patient refused wheelchair and ambulated to vehicle with no complications.

## 2021-09-18 NOTE — Nurses Notes (Signed)
Catheter advanced per provider orders.  Patient c/o 8/10 pain.  PRN percocet administered per MAR.

## 2021-09-18 NOTE — Care Management Notes (Signed)
[facility]  Care Management Initial Evaluation    Patient Name: Joshua Jordan  Date of Birth: 29-Jun-1981  Sex: male  Date/Time of Admission: 09/17/2021  5:37 PM  Room/Bed: 110/A  Payor: /   Primary Care Providers:  Adkins, K Italy, DO, DO (General)    Pharmacy Info:   Preferred Pharmacy       [facility] [id number] - [address], [place] - [address] AT [facility] [address]    [address] [place] [address]    Phone: [phone] Fax: [phone]    Hours: Not open 24 hours          Emergency Contact Info:   No emergency contact information on file.    History:   Saron Tweed is a 40 y.o., male, admitted for observation due to symptoms related to cellulitis       Height/Weight: 185.4 cm (6\' 1" ) / 81.9 kg (180 lb 9.6 oz)     LOS: 0 days   Admitting Diagnosis: Cellulitis of foot [L03.119]    Assessment:     Per pt, he has Medicare and is not a self -pay  Provided pt with blank MPOA for consideration   Pt reports having pain and would like medication, if possible.  Nursing provided with update       09/18/21 09/20/21   Assessment Details   Assessment Type Admission   Readmission   Is this a readmission? No   Insurance Information/Type   Insurance type Medicare   Employment/Financial   Patient has Prescription Coverage?  Yes        Name of Insurance Coverage for Medications Medicare   Financial Concerns none   Living Environment   Select an age group to open "lives with" row.  Adult   Lives With parent(s)   Living Arrangements house   Able to Return to Prior Arrangements yes   Living Arrangement Comments pt is an over the road truck driver   Home Safety   Home Assessment: No Problems Identified   Home Accessibility no concerns   Care Management Plan   Discharge Planning Status initial meeting   Projected Discharge Date 09/19/21   Discharge plan discussed with: Patient   Patient aware of possible cost for ambulance transport?  Yes   Discharge Needs Assessment   Equipment Needed After Discharge glucometer    Discharge Facility/Level of Care Needs Home with DME (code 1)   Transportation Available *none  (pt will need assistance with transport back to his tractor trailer)   Referral Information   Admission Type observation   Address Verified verified-no changes   Arrived From home or self-care   ADVANCE DIRECTIVES   Does the Patient have an Advance Directive? No, Information Offered and Given   Patient Requests Assistance in Having Advance Directive Notarized. No, Will Do Independently   LAY CAREGIVER    Appointed Lay Caregiver? I Decline   Mutuality/Individual Preferences    Anxieties, Fears or Concerns needs new glucometer         Discharge Plan:  Home (Patient/Family Member/other) (code 1)      The patient will continue to be evaluated for developing discharge needs.     Case Manager: 09/21/21, SOCIAL WORKER

## 2021-09-18 NOTE — Nurses Notes (Signed)
Patient bladder scanned per provider order.  Bladder scan showed 17ml.

## 2021-09-19 LAB — WOUND, SUPERFICIAL/NON-STERILE SITE, AEROBIC CULTURE AND GRAM STAIN

## 2021-09-22 LAB — ADULT ROUTINE BLOOD CULTURE, SET OF 2 BOTTLES (BACTERIA AND YEAST)
BLOOD CULTURE, ROUTINE: NO GROWTH
BLOOD CULTURE, ROUTINE: NO GROWTH

## 2021-10-01 LAB — HEMOGLOBIN A1C: Hemoglobin A1C, External: 9.7 % — ABNORMAL HIGH (ref 0.0–5.7)

## 2021-12-25 ENCOUNTER — Inpatient Hospital Stay
Admit: 2021-12-25 | Discharge: 2021-12-26 | Disposition: A | Discharge status: Home / Self Care | Attending: Emergency Medicine

## 2021-12-25 DIAGNOSIS — E1165 Type 2 diabetes mellitus with hyperglycemia: Secondary | ICD-10-CM

## 2021-12-25 MED ORDER — SODIUM CHLORIDE 0.9 % IV SOLN
0.9 % | INTRAVENOUS | Status: DC
Start: 2021-12-25 — End: 2021-12-25
  Administered 2021-12-25: 1000 mL via INTRAVENOUS

## 2021-12-25 MED ORDER — KETOROLAC TROMETHAMINE 30 MG/ML IJ SOLN
30 MG/ML | Freq: Once | INTRAMUSCULAR | Status: AC
Start: 2021-12-25 — End: 2021-12-25
  Administered 2021-12-25: 23:00:00 30 mg via INTRAVENOUS

## 2021-12-25 MED FILL — SODIUM CHLORIDE 0.9 % IV SOLN: 0.9 % | INTRAVENOUS | Qty: 1000

## 2021-12-25 MED FILL — KETOROLAC TROMETHAMINE 30 MG/ML IJ SOLN: 30 MG/ML | INTRAMUSCULAR | Qty: 1

## 2021-12-25 NOTE — ED Provider Notes (Signed)
Emergency Department Encounter  Location: [facility] EMERGENCY DEPARTMENT    Patient: Evan Crawford  MRN: [id number]  DOB: July 30, 1981  Date of evaluation: 12/25/2021  ED Provider: Arabella Merles, MD    8:00p.m.  Evan Crawford was checked out to me by Dr. Deatra Canter. Please see his/her initial documentation for details of the patient's initial ED presentation, physical exam and completed studies.    In brief, Evan Crawford is a 41 y.o. male that presented to the emergency department lower abd pain, states that he has had prostatitis in the past.     I have reviewed and interpreted all of the currently available lab results and diagnostics from this visit:  Results for orders placed or performed during the hospital encounter of 12/25/21   CBC with Auto Differential   Result Value Ref Range    WBC 5.8 3.7 - 11.0 10 X 3/mm^3    RBC 5.11 4.70 - 6.10 10 X 6/mm^3    Hemoglobin 11.6 (L) 13.5 - 17.5 g/dL    Hematocrit 76.8 (L) 42.0 - 52.0 %    MCV 74.4 (L) 80.0 - 100.0 fL    MCH 22.7 (L) 27.0 - 35.0 pg    MCHC 30.5 (L) 32.0 - 36.0 g/dL    RDW-CV 11.5 72.6 - 20.3 %    Platelets 350 150 - 450 uLx10^3    MPV 11.3 9.4 - 12.3 fL    Neutrophils % 65 50 - 70 %    Neutrophils Absolute 3.8 1.8 - 7.7 10x3/mm^3    Lymphocyte % 24 20 - 40 %    Lymphocytes Absolute 1.4 1.0 - 4.0 10x3/mm^3    Monocytes % 7 1 - 15 %    Monocytes Absolute 0.4 0.3 - 1.0 10x3/mm^3    Eosinophils % 2 0 - 5 %    Eosinophils Absolute 0.1 0.0 - 0.5 10x3/mm^3    Basophils % 0 0 - 1 %    Basophils Absolute 0.0 0.0 - 0.1 10x3/mm^3   Comprehensive Metabolic Panel w/ Reflex to MG   Result Value Ref Range    Glucose 636 (HH) 65 - 100 mg/dL    BUN 25 (H) 9 - 20 mg/dL    Creatinine 5.59 7.41 - 1.25 mg/dL    Sodium 638 (L) 453 - 145 mEq/L    Potassium 4.5 3.6 - 5.0 mEq/L    Chloride 97 (L) 98 - 107 mEq/L    CO2 26 22 - 32 mEq/L    Calcium 8.5 8.4 - 10.2 mg/dL    Albumin 3.8 3.5 - 5.0 g/dL    Total Protein 7.2 6.3 - 8.2 g/dL    Total Bilirubin 0.2 0.2 - 1.3 mg/dL    Alkaline Phosphatase 127 (H) 38 -  126 U/L    AST 17 17 - 59 U/L    ALT 18 0 - 50 U/L    GFR, Estimated 117 >60 mL/min/1.52m2   Urinalysis with Reflex to Culture    Specimen: Urine   Result Value Ref Range    Color, Urine Yellow     Clarity, UA Clear     Glucose, Ur >=1000 mg/dL (A) Negative mg/dL    Ketones, Urine Negative Negative mg/dL    Bilirubin, Urine Negative Negative    Specific Gravity, Urine 1.010 1.005 - 1.030    pH, Urine 7.0 5.0 - 7.0    Blood, Urine Negative Negative    Protein, Ur (gm/dL) Negative Negative mg/dL    Urobilinogen, Urine 0.2 E.U./dL <6.4 E.U./dL  Nitrate, UA Negative Negative    Leukocyte Esterase, Urine Negative Negative    Culture Indicated? No     WBC, UA None Seen #/HPF    RBC, UA None Seen #/HPF    Squam Epithel, UA Few #/LPF    MUCUS, URINE None Seen     BACTERIA, URINE None Seen    Urine Drug Screen   Result Value Ref Range    Amphetamine Negative Negative    Barbiturates Negative Negative    Benzodiazepines Negative Negative    Cannabinoids Negative Negative    Cocaine Negative Negative    MDMA, Urine Negative Negative    Methadone Negative Negative    Methamphetamine Negative Negative    Opiates Negative Negative    PCP Negative Negative   Beta-Hydroxybutyrate   Result Value Ref Range    Beta-Hydroxybutyrate 0.15 0.00 - 0.27 mmol/L     No results found.    Final ED Course and MDM:  In brief, Evan Crawford is a 41 y.o. male whose care was signed out to me by the outgoing provider. In brief, for lab review and final disposition    ED Medication Orders (From admission, onward)      Start Ordered     Status Ordering Provider    12/26/21 0045 12/26/21 0030  morphine injection 4 mg  ONCE         Last MAR action: Given - by [facility], LISA on 12/26/21 at 0042 Evan Crawford    12/25/21 2345 12/25/21 2340  0.9 % sodium chloride bolus  ONCE         Last MAR action: Canceled Entry - by [facility], LISA on 12/26/21 at 0032 Evan Crawford    12/25/21 2115 12/25/21 2112  morphine injection 4 mg  ONCE         Last MAR action: Given - by  [facility], LISA on 12/25/21 at 2121 Evan Crawford    12/25/21 2100 12/25/21 2058  insulin regular (HUMULIN R;NOVOLIN R) injection 15 Units  ONCE         Last MAR action: Given - by [facility], LISA on 12/25/21 at 2122 Evan Crawford    12/25/21 2045 12/25/21 2044  0.9 % sodium chloride bolus  ONCE         Last MAR action: Stopped - by [facility], LISA on 12/25/21 at 2229 Evan Crawford    12/25/21 1930 12/25/21 1917  ketorolac (TORADOL) injection 30 mg  ONCE         Last MAR action: Given - by [facility], LISA on 12/25/21 at 1929 EM[place], JAMES B            Final Impression      1. Hyperglycemia due to diabetes mellitus (HCC)    2. Chronic prostatitis        DISPOSITION Decision To Discharge 12/26/2021 12:19:43 AM     (Please note that portions of this note may have been completed with a voice recognition program. Efforts were made to edit the dictations but occasionally words are mis-transcribed.)    Arabella Merles, MD       Arabella Merles, MD  12/26/21 3888

## 2021-12-25 NOTE — ED Notes (Signed)
Patient still holding groin area and states Toradol has not helped at all with pain.     Mahlon Gammon, RN  12/25/21 2018

## 2021-12-25 NOTE — ED Provider Notes (Signed)
eMERGENCY dEPARTMENT eNCOUnter        CHIEF COMPLAINT    Chief Complaint   Patient presents with    Abdominal Pain    Hematuria       HPI    Evan Crawford is a 41 y.o. male who presents via EMS for evaluation.  Patient is here complaining of "prostate pain".  The patient reports a history of recurrent episodes of prostatitis.  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  The patient states that he was at a truck stop when he had severe pain in his prostate area.  He states he was also having hematuria.  He states that he does have some difficulty initiating his urine stream.  He is also complaining of pain with urination.  He denies fever.  He denies any other recent illness.  He denies eye symptoms, chest pain, shortness of breath, abdominal pain, nausea, vomiting, diarrhea, constipation, back pain, joint pain, rash, peripheral edema or recent weight change.  He has no other concerns.    REVIEW OF SYSTEMS    See HPI for further details. Review of systems otherwise negative.     PAST MEDICAL HISTORY    Past Medical History:   Diagnosis Date    CAD (coronary artery disease)     Diabetes mellitus (HCC)     Hyperlipidemia     Prostatitis        SURGICAL HISTORY    Past Surgical History:   Procedure Laterality Date    APPENDECTOMY      CARDIAC SURGERY      CHOLECYSTECTOMY      FRACTURE SURGERY         CURRENT MEDICATIONS    Current Outpatient Rx   Medication Sig Dispense Refill    isosorbide mononitrate (IMDUR) 60 MG extended release tablet Take 1 tablet by mouth daily      losartan (COZAAR) 25 MG tablet Take 1 tablet by mouth daily      tamsulosin (FLOMAX) 0.4 MG capsule Take 1 capsule by mouth 2 times daily      insulin aspart (NOVOLOG) 100 UNIT/ML injection vial Inject into the skin 3 times daily (before meals)      insulin glargine (LANTUS) 100 UNIT/ML injection vial Inject 50 Units into the skin 2 times daily      apixaban (ELIQUIS) 5 MG TABS tablet Take 1 tablet by  mouth 2 times daily      gabapentin (NEURONTIN) 300 MG capsule Take 1 capsule by mouth 3 times daily.      aspirin 81 MG EC tablet Take 1 tablet by mouth daily      atenolol (TENORMIN) 25 MG tablet Take 25 mg by mouth daily (Patient not taking: Reported on 12/25/2021)      HYDROcodone-acetaminophen (NORCO) 5-325 MG per tablet Take 1 tablet by mouth every 4 hours as needed. (Patient not taking: Reported on 12/25/2021)      metoprolol tartrate (LOPRESSOR) 25 MG tablet Take 1 tablet by mouth 2 times daily      ondansetron (ZOFRAN-ODT) 4 MG disintegrating tablet Take 4 mg by mouth every 8 hours as needed (Patient not taking: Reported on 12/25/2021)      ticagrelor (BRILINTA) 90 MG TABS tablet Take 1 tablet by mouth daily         ALLERGIES    Allergies   Allergen Reactions    Penicillins Other (See Comments)  Pt unsure, believes it may cause rash.        FAMILY HISTORY    History reviewed. No pertinent family history.    SOCIAL HISTORY    Social History     Socioeconomic History    Marital status: Divorced     Spouse name: None    Number of children: None    Years of education: None    Highest education level: None   Tobacco Use    Smoking status: Never    Smokeless tobacco: Current     Types: Chew   Vaping Use    Vaping Use: Never used   Substance and Sexual Activity    Alcohol use: Not Currently    Drug use: Never    Sexual activity: Yes     Partners: Female       PHYSICAL EXAM    VITAL SIGNS: Pulse (!) 115   Temp 97.7 F (36.5 C) (Oral)   Resp 20   Ht 6\' 1"  (1.854 m)   Wt 210 lb (95.3 kg)   SpO2 100%   BMI 27.71 kg/m   Constitutional:  Well developed, overweight, no acute distress, non-toxic appearance   Eyes:  PERRL, conjunctiva normal   HENT:  Atraumatic, external ears normal, nose normal, oropharynx moist.  Neck- supple   Respiratory:  No respiratory distress, normal breath sounds   Cardiovascular:  Normal rate, normal rhythm, no murmurs, no gallops, no rubs   GI: Abdomen is soft, nondistended and nontender  with active bowel sounds x4.  There are no palpable masses or peritoneal signs.  GU: Normal external male genitalia.  Musculoskeletal:  No edema, no tenderness   Integument:  Well hydrated       EMERGENCY DEPARTMENT COURSE  Patient is seen and examined.  An IV is initiated and labs are obtained including CBC, CMP, urinalysis and drug screen urine.  Patient is given a normal saline infusion and Toradol 30 mg IV push.  Care of this patient is transferred to Dr. at shift change.  Please see his documentation for results, final diagnosis and disposition plan.          LABS    Labs Reviewed   CBC WITH AUTO DIFFERENTIAL   COMPREHENSIVE METABOLIC PANEL W/ REFLEX TO MG FOR LOW K   URINALYSIS WITH REFLEX TO CULTURE   URINE DRUG SCREEN         (Please note that portions of this note were completed with a voice recognition program.  Efforts were made to edit the dictations but occasionally words are mis-transcribed.)       Angelyn Punt, DO  12/25/21 1955

## 2021-12-25 NOTE — ED Notes (Signed)
FSBS 530     Sonakshi Rolland L Jahara Dail  12/25/21 2225

## 2021-12-26 LAB — BETA-HYDROXYBUTYRATE: Beta-Hydroxybutyrate: 0.15 mmol/L (ref 0.00–0.27)

## 2021-12-26 LAB — CBC WITH AUTO DIFFERENTIAL
Basophils %: 0 % (ref 0–1)
Basophils Absolute: 0 10x3/mm^3 (ref 0.0–0.1)
Eosinophils %: 2 % (ref 0–5)
Eosinophils Absolute: 0.1 10x3/mm^3 (ref 0.0–0.5)
Hematocrit: 38 % — ABNORMAL LOW (ref 42.0–52.0)
Hemoglobin: 11.6 g/dL — ABNORMAL LOW (ref 13.5–17.5)
Lymphocyte %: 24 % (ref 20–40)
Lymphocytes Absolute: 1.4 10x3/mm^3 (ref 1.0–4.0)
MCH: 22.7 pg — ABNORMAL LOW (ref 27.0–35.0)
MCHC: 30.5 g/dL — ABNORMAL LOW (ref 32.0–36.0)
MCV: 74.4 fL — ABNORMAL LOW (ref 80.0–100.0)
MPV: 11.3 fL (ref 9.4–12.3)
Monocytes %: 7 % (ref 1–15)
Monocytes Absolute: 0.4 10x3/mm^3 (ref 0.3–1.0)
Neutrophils %: 65 % (ref 50–70)
Neutrophils Absolute: 3.8 10x3/mm^3 (ref 1.8–7.7)
Platelets: 350 uLx10^3 (ref 150–450)
RBC: 5.11 10 X 6/mm^3 (ref 4.70–6.10)
RDW-CV: 14.3 % (ref 11.5–14.5)
WBC: 5.8 10 X 3/mm^3 (ref 3.7–11.0)

## 2021-12-26 LAB — URINALYSIS WITH REFLEX TO CULTURE
BACTERIA, URINE: NONE SEEN
Bilirubin, Urine: NEGATIVE
Blood, Urine: NEGATIVE
Glucose, Ur: >=1000 mg/dL — AB
Ketones, Urine: NEGATIVE mg/dL
Leukocyte Esterase, Urine: NEGATIVE
MUCUS, URINE: NONE SEEN
Nitrate, UA: NEGATIVE
Protein, Ur (gm/dL): NEGATIVE mg/dL
RBC, UA: NONE SEEN #/HPF
Specific Gravity, Urine: 1.01 (ref 1.005–1.030)
Urobilinogen, Urine: 0.2 E.U./dL (ref <2.0)
WBC, UA: NONE SEEN #/HPF
pH, Urine: 7 (ref 5.0–7.0)

## 2021-12-26 LAB — COMPREHENSIVE METABOLIC PANEL W/ REFLEX TO MG FOR LOW K
ALT: 18 U/L (ref 0–50)
AST: 17 U/L (ref 17–59)
Albumin: 3.8 g/dL (ref 3.5–5.0)
Alkaline Phosphatase: 127 U/L — ABNORMAL HIGH (ref 38–126)
BUN: 25 mg/dL — ABNORMAL HIGH (ref 9–20)
CO2: 26 mEq/L (ref 22–32)
Calcium: 8.5 mg/dL (ref 8.4–10.2)
Chloride: 97 mEq/L — ABNORMAL LOW (ref 98–107)
Creatinine: 0.76 mg/dL (ref 0.66–1.25)
GFR, Estimated: 117 mL/min/{1.73_m2} (ref >60)
Glucose: 636 mg/dL (ref 65–100)
Potassium: 4.5 mEq/L (ref 3.6–5.0)
Sodium: 134 mEq/L — ABNORMAL LOW (ref 135–145)
Total Bilirubin: 0.2 mg/dL (ref 0.2–1.3)
Total Protein: 7.2 g/dL (ref 6.3–8.2)

## 2021-12-26 LAB — POCT GLUCOSE: Glucose: 500 mg/dL

## 2021-12-26 LAB — URINE DRUG SCREEN
Amphetamine: NEGATIVE
Barbiturates: NEGATIVE
Benzodiazepines: NEGATIVE
Cannabinoids: NEGATIVE
Cocaine: NEGATIVE
MDMA, Urine: NEGATIVE
Methadone: NEGATIVE
Methamphetamine: NEGATIVE
Opiates: NEGATIVE
PCP: NEGATIVE

## 2021-12-26 MED ORDER — INSULIN REGULAR HUMAN 100 UNIT/ML IJ SOLN
100 UNIT/ML | Freq: Once | INTRAMUSCULAR | Status: AC
Start: 2021-12-26 — End: 2021-12-25
  Administered 2021-12-26: 01:00:00 15 [IU] via INTRAVENOUS

## 2021-12-26 MED ORDER — SODIUM CHLORIDE 0.9 % IV BOLUS
0.9 % | Freq: Once | INTRAVENOUS | Status: AC
Start: 2021-12-26 — End: 2021-12-26
  Administered 2021-12-26: 02:00:00 500 mL via INTRAVENOUS

## 2021-12-26 MED ORDER — MORPHINE SULFATE 4 MG/ML IV SOLN
4 MG/ML | Freq: Once | INTRAVENOUS | Status: AC
Start: 2021-12-26 — End: 2021-12-25
  Administered 2021-12-26: 01:00:00 4 mg via INTRAVENOUS

## 2021-12-26 MED ORDER — SODIUM CHLORIDE 0.9 % IV SOLN
0.9 % | INTRAVENOUS | Status: DC
Start: 2021-12-26 — End: 2021-12-25

## 2021-12-26 MED ORDER — MORPHINE SULFATE (PF) 2 MG/ML IV SOLN
2 MG/ML | Freq: Once | INTRAVENOUS | Status: DC
Start: 2021-12-26 — End: 2021-12-25

## 2021-12-26 MED ORDER — MORPHINE SULFATE 4 MG/ML IV SOLN
4 MG/ML | Freq: Once | INTRAVENOUS | Status: AC
Start: 2021-12-26 — End: 2021-12-26
  Administered 2021-12-26: 05:00:00 4 mg via INTRAVENOUS

## 2021-12-26 MED ORDER — SODIUM CHLORIDE 0.9 % IV BOLUS
0.9 % | Freq: Once | INTRAVENOUS | Status: AC
Start: 2021-12-26 — End: 2021-12-25
  Administered 2021-12-26: 01:00:00 1000 mL via INTRAVENOUS

## 2021-12-26 MED ORDER — CIPROFLOXACIN HCL 500 MG PO TABS
500 MG | ORAL_TABLET | Freq: Two times a day (BID) | ORAL | 0 refills | Status: AC
Start: 2021-12-26 — End: 2022-01-05

## 2021-12-26 MED FILL — MORPHINE SULFATE 4 MG/ML IV SOLN: 4 mg/mL | INTRAVENOUS | Qty: 1

## 2021-12-26 MED FILL — MORPHINE SULFATE 2 MG/ML IJ SOLN: 2 MG/ML | INTRAMUSCULAR | Qty: 1

## 2021-12-26 MED FILL — SODIUM CHLORIDE 0.9 % IV SOLN: 0.9 % | INTRAVENOUS | Qty: 1000

## 2021-12-26 MED FILL — SODIUM CHLORIDE 0.9 % IV SOLN: 0.9 % | INTRAVENOUS | Qty: 500

## 2021-12-26 MED FILL — HUMULIN R 100 UNIT/ML IJ SOLN: 100 UNIT/ML | INTRAMUSCULAR | Qty: 1

## 2021-12-26 NOTE — ED Notes (Addendum)
Patient again very restless and holding groin area complaining that pain is bad again. Urine clear yellow, no frank blood noted. Dr. Angelyn Punt notified.     Mahlon Gammon, RN  12/26/21 0232       Mahlon Gammon, RN  12/26/21 [phone]

## 2022-01-29 LAB — CBC
Basophils: 0.7 % (ref 0.0–3.0)
Eosinophils: 3.4 % (ref 0.0–4.0)
Hematocrit: 35.1 % — ABNORMAL LOW (ref 42.0–52.0)
Hemoglobin: 11.3 g/dL — ABNORMAL LOW (ref 14.0–18.0)
Lymphocytes: 27.9 % (ref 17.6–49.6)
MCH: 22.6 pg — ABNORMAL LOW (ref 28.0–32.0)
MCHC: 32.3 g/dL — ABNORMAL LOW (ref 33.0–37.0)
MCV: 69.9 fL — ABNORMAL LOW (ref 80.0–94.0)
Monocytes: 7.8 % (ref 4.1–12.4)
Morphology: ABNORMAL
Platelets: 242 10*3/uL (ref 130–400)
RBC: 5.02 10*6/uL (ref 4.70–6.10)
RDW: 15.7 % — ABNORMAL HIGH (ref 11.5–14.5)
Seg Neutrophils: 60.2 % (ref 39.4–72.5)
WBC: 5.4 10*3/uL (ref 4.8–10.8)

## 2022-01-29 LAB — BASIC METABOLIC PANEL
BUN: 15 mg/dL (ref 7–22)
CO2: 27 mEq/L (ref 22–31)
Calcium: 9 mg/dL (ref 8.4–10.2)
Chloride: 102 mEq/L (ref 98–107)
Creatinine: 0.7 mg/dL (ref 0.4–1.1)
Est, Glom Filt Rate: >=60
Glucose: 316 mg/dL — ABNORMAL HIGH (ref 70–126)
Potassium: 3 mEq/L — ABNORMAL LOW (ref 3.5–5.1)
Sodium: 136 mEq/L (ref 136–145)

## 2022-01-29 LAB — CBC MORPHOLOGY

## 2022-01-29 LAB — POC GLUCOSE FINGERSTICK
POC Glucose: 530 mg/dL (ref 70–126)
POC Glucose: 263 mg/dL — ABNORMAL HIGH (ref 70–126)

## 2022-01-29 LAB — ACETONE, QUANTITATIVE, SERUM: KETONE, SERUM: NEGATIVE

## 2022-04-17 ENCOUNTER — Inpatient Hospital Stay
Admit: 2022-04-17 | Discharge: 2022-04-17 | Disposition: A | Discharge status: Home / Self Care | Payer: MEDICARE | Attending: Emergency Medicine

## 2022-04-17 DIAGNOSIS — L03116 Cellulitis of left lower limb: Secondary | ICD-10-CM

## 2022-04-17 DIAGNOSIS — L03119 Cellulitis of unspecified part of limb: Secondary | ICD-10-CM

## 2022-04-17 MED ORDER — SULFAMETHOXAZOLE-TRIMETHOPRIM 800-160 MG PO TABS
800-160 MG | ORAL_TABLET | Freq: Two times a day (BID) | ORAL | 0 refills | Status: AC
Start: 2022-04-17 — End: 2022-04-27

## 2022-04-17 MED ORDER — CEPHALEXIN 500 MG PO CAPS
500 MG | ORAL_CAPSULE | Freq: Three times a day (TID) | ORAL | 0 refills | Status: AC
Start: 2022-04-17 — End: 2022-04-27

## 2022-04-17 MED ORDER — CEPHALEXIN 500 MG PO CAPS
500 MG | Freq: Once | ORAL | Status: AC
Start: 2022-04-17 — End: 2022-04-17
  Administered 2022-04-17: 18:00:00 500 mg via ORAL

## 2022-04-17 MED ORDER — SULFAMETHOXAZOLE-TRIMETHOPRIM 800-160 MG PO TABS
800-160 MG | Freq: Once | ORAL | Status: AC
Start: 2022-04-17 — End: 2022-04-17
  Administered 2022-04-17: 18:00:00 1 via ORAL

## 2022-04-17 MED FILL — SULFAMETHOXAZOLE-TRIMETHOPRIM 800-160 MG PO TABS: 800-160 MG | ORAL | Qty: 1

## 2022-04-17 MED FILL — CEPHALEXIN 500 MG PO CAPS: 500 MG | ORAL | Qty: 1

## 2022-04-17 NOTE — ED Notes (Signed)
Dry dressing to incision on right foot      Lovena Neighbours, RN  04/17/22 1337

## 2022-04-17 NOTE — ED Provider Notes (Signed)
[facility] EMERGENCY DEPARTMENT  EMERGENCY MEDICINE     Pt Name: Evan Crawford  MRN: [id number]  Birthdate 14-Nov-1980  Date of evaluation: 04/17/2022  PCP:    No primary care provider on file.  Provider: Hale Drone, DO    CHIEF COMPLAINT       Chief Complaint   Patient presents with    Wound Check       HISTORY OF PRESENT ILLNESS      Patient presents to the ER with chief complaint of foot infections.  Patient states he had his right big toe amputated about a week ago in [place].  Since then he has had continued pain and he states he feels like the toe is still better and did not know if that was normal.  He also states that his left foot has some blisters and wounds that he thinks are starting to get infected.  He denies any fevers or chills.  Denies any vomiting or diarrhea.  Patient was supposed to see the foot surgeon today in [place] but he states his truck broke down and he was not able to make it back for that appointment.  They have now moved the appointment until next week.    The history is provided by the patient.      Triage notes and Nursing notes were reviewed by myself.  Any discrepancies are addressed above.    PAST MEDICAL HISTORY     Past Medical History:   Diagnosis Date    CAD (coronary artery disease)     Diabetes mellitus (HCC)     Hyperlipidemia     Prostatitis        SURGICAL HISTORY       Past Surgical History:   Procedure Laterality Date    APPENDECTOMY      CARDIAC SURGERY      CHOLECYSTECTOMY      FRACTURE SURGERY         CURRENT MEDICATIONS       Previous Medications    APIXABAN (ELIQUIS) 5 MG TABS TABLET    Take 1 tablet by mouth 2 times daily    ASPIRIN 81 MG EC TABLET    Take 1 tablet by mouth daily    ATENOLOL (TENORMIN) 25 MG TABLET    Take 25 mg by mouth daily    CIPROFLOXACIN (CIPRO) 500 MG TABLET    Take 1 tablet by mouth 2 times daily    GABAPENTIN (NEURONTIN) 300 MG CAPSULE    Take 1 capsule by mouth 3 times daily.    HYDROCODONE-ACETAMINOPHEN (NORCO) 5-325 MG PER TABLET    Take  1 tablet by mouth every 4 hours as needed.    INSULIN ASPART (NOVOLOG) 100 UNIT/ML INJECTION VIAL    Inject into the skin 3 times daily (before meals)    INSULIN GLARGINE (LANTUS) 100 UNIT/ML INJECTION VIAL    Inject 50 Units into the skin 2 times daily    INSULIN LISPRO (HUMALOG) 100 UNIT/ML INJECTION VIAL    Inject into the skin 3 times daily (before meals) Sliding scale    ISOSORBIDE MONONITRATE (IMDUR) 60 MG EXTENDED RELEASE TABLET    Take 1 tablet by mouth daily    LOSARTAN (COZAAR) 25 MG TABLET    Take 1 tablet by mouth daily    METOPROLOL TARTRATE (LOPRESSOR) 25 MG TABLET    Take 1 tablet by mouth 2 times daily    ONDANSETRON (ZOFRAN-ODT) 4 MG DISINTEGRATING TABLET    Take 4  mg by mouth every 8 hours as needed    TAMSULOSIN (FLOMAX) 0.4 MG CAPSULE    Take 1 capsule by mouth 2 times daily    TICAGRELOR (BRILINTA) 90 MG TABS TABLET    Take 1 tablet by mouth daily       ALLERGIES       Allergies   Allergen Reactions    Penicillins Other (See Comments)     Pt unsure, believes it may cause rash.        FAMILY HISTORY     History reviewed. No pertinent family history.     SOCIAL HISTORY       Social History     Socioeconomic History    Marital status: Divorced     Spouse name: None    Number of children: None    Years of education: None    Highest education level: None   Tobacco Use    Smoking status: Never    Smokeless tobacco: Current     Types: Chew   Vaping Use    Vaping Use: Never used   Substance and Sexual Activity    Alcohol use: Not Currently    Drug use: Never    Sexual activity: Yes     Partners: Female   Social History Narrative    ** Merged History Encounter **            REVIEW OF SYSTEMS     Review of Systems   Constitutional:  Negative for chills and fever.   HENT:  Negative for congestion and sore throat.    Eyes:  Negative for pain.   Respiratory:  Negative for cough and shortness of breath.    Cardiovascular:  Negative for chest pain.   Gastrointestinal:  Negative for abdominal pain and vomiting.    Neurological:  Negative for seizures.     Except as noted above the remainder of the review of systems was reviewed and is negative.  SCREENINGS                        PHYSICAL EXAM    (up to 7 for level 4, 8 or more for level 5)     ED Triage Vitals [04/17/22 1306]   BP Temp Temp Source Pulse Respirations SpO2 Height Weight - Scale   (!) 144/95 98.5 F (36.9 C) Oral 100 24 100 % 6\' 1"  (1.854 m) 215 lb (97.5 kg)       Physical Exam  Constitutional:       Appearance: Normal appearance.   HENT:      Head: Normocephalic and atraumatic.      Nose: Nose normal.   Eyes:      Conjunctiva/sclera: Conjunctivae normal.   Pulmonary:      Effort: Pulmonary effort is normal.   Musculoskeletal:      Comments: Patient's right foot is in a dressing that appears to be from when the surgery was.  Dressing removed and sutures still intact where the right great toe was amputated.  There is a little bit of redness around that wound.  No drainage or fluctuance noted.  No increase in temperature.  Looking at the left foot patient does appear to have dark blood blisters on the dorsal aspect of the distal phalanx of the second third and fourth toes.  Slight redness noted to this area as well.   Skin:     General: Skin is warm and dry.   Neurological:  General: No focal deficit present.      Mental Status: He is alert and oriented to person, place, and time.   Psychiatric:         Mood and Affect: Mood normal.         Behavior: Behavior normal.         DIAGNOSTIC RESULTS     EKG:(none if blank)  All EKGs are interpreted by the Emergency Department Physician who either signs or Co-signs this chart in the absence of a cardiologist.        RADIOLOGY: (none if blank)   I directly visualized the following images and reviewed the radiologist interpretations.  Interpretation per the Radiologist below, if available at the time of this note:  No orders to display       LABS:  Labs Reviewed - No data to display    All other labs were within  normal range or not returned as of this dictation.  Please note, any cultures that may have been sent were not resulted at the time of this patient visit.    EMERGENCY DEPARTMENT COURSE and Medical Decision Making:     Vitals:    Vitals:    04/17/22 1306   BP: (!) 144/95   Pulse: 100   Resp: 24   Temp: 98.5 F (36.9 C)   TempSrc: Oral   SpO2: 100%   Weight: 215 lb (97.5 kg)   Height: 6\' 1"  (1.854 m)       PROCEDURES: (None if blank)  Procedures       MDM  Number of Diagnoses or Management Options  Cellulitis of foot  Diagnosis management comments:   Patient presents with concerns for a foot infection.  Differential diagnosis includes cellulitis, osteomyelitis, abscess, postop wound infection, among other etiologies.  Patient has no fever and overall looks well.  There is some signs of cellulitis on the right foot along with the possibility of some mild redness on the left foot, therefore patient will be started on Bactrim and Keflex at this time and instructions to follow-up with the surgeon in the next week.        Strict return precautions and follow up instructions were discussed with the patient with which the patient agrees    ED Medications administered this visit:    Medications   sulfamethoxazole-trimethoprim (BACTRIM DS;SEPTRA DS) 800-160 MG per tablet 1 tablet (has no administration in time range)   cephALEXin (KEFLEX) capsule 500 mg (has no administration in time range)         FINAL IMPRESSION      1. Cellulitis of foot Stable         DISPOSITION/PLAN   DISPOSITION Decision To Discharge 04/17/2022 01:22:51 PM      PATIENT REFERRED TO:  No follow-up provider specified.    DISCHARGE MEDICATIONS:  New Prescriptions    CEPHALEXIN (KEFLEX) 500 MG CAPSULE    Take 1 capsule by mouth 3 times daily for 10 days    SULFAMETHOXAZOLE-TRIMETHOPRIM (BACTRIM DS;SEPTRA DS) 800-160 MG PER TABLET    Take 1 tablet by mouth 2 times daily for 10 days              Franki Stemen 04/19/2022, DO (electronically signed)  Attending  Physician, Emergency Department         Delynn Flavin, DO  04/17/22 1325

## 2022-04-17 NOTE — ED Triage Notes (Signed)
Ambulated into ER for wound/incision check. Patient states had right first big toes removed approx. 1 week ago in [place], was supposed to F/U in [place] and was unable to. Concern because incision site increased in pain. Concerned for left second/third toe.    Patient states he did start to drive more and could be the cause of the increase in pain.  Has been taking ATB as prescribed

## 2022-04-17 NOTE — Discharge Instructions (Addendum)
Take antibiotics as directed.  Follow up with your PCP in the next 3 days.  Return to the ER if you  develop worsening symptoms, fevers, vomiting, weakness, increased redness or if you have any other concerns.  Follow-up with your foot surgeon as soon as you can this week.

## 2022-10-29 LAB — URINALYSIS WITH MICROSCOPIC
BACTERIA, URINE: ABSENT /HPF
Bilirubin Urine: NEGATIVE
Casts UA: ABSENT /LPF
Crystals, UA: ABSENT
Leukocyte Esterase, Urine: NEGATIVE
Nitrite, Urine: NEGATIVE
RBC, UA: ABSENT /HPF (ref 0–2)
Specific Gravity, Urine: 1.01 mg/dL (ref 1.005–1.030)
Squam Epithel, UA: ABSENT /HPF
Urobilinogen, Urine: 0.2 mg/dL (ref 0.2–1.0)
WBC, UA: ABSENT /HPF (ref 0–4)
pH, UA: 8 (ref 5.0–8.5)

## 2024-06-15 ENCOUNTER — Ambulatory Visit (HOSPITAL_BASED_OUTPATIENT_CLINIC_OR_DEPARTMENT_OTHER): Payer: Self-pay | Admitting: Internal Medicine

## 2024-06-15 ENCOUNTER — Encounter (HOSPITAL_BASED_OUTPATIENT_CLINIC_OR_DEPARTMENT_OTHER): Payer: Self-pay
